# Patient Record
Sex: Male | Born: 1945 | Race: White | Hispanic: No | Marital: Married | Smoking: Never smoker
Health system: Southern US, Community
[De-identification: ages and names within clinical notes are randomized; demographics above are authoritative.]

## PROBLEM LIST (undated history)

## (undated) DIAGNOSIS — K649 Unspecified hemorrhoids: Secondary | ICD-10-CM

## (undated) DIAGNOSIS — I10 Essential (primary) hypertension: Secondary | ICD-10-CM

## (undated) DIAGNOSIS — Z950 Presence of cardiac pacemaker: Secondary | ICD-10-CM

## (undated) DIAGNOSIS — I4510 Unspecified right bundle-branch block: Secondary | ICD-10-CM

## (undated) DIAGNOSIS — I441 Atrioventricular block, second degree: Secondary | ICD-10-CM

## (undated) DIAGNOSIS — M1990 Unspecified osteoarthritis, unspecified site: Secondary | ICD-10-CM

## (undated) DIAGNOSIS — E7800 Pure hypercholesterolemia, unspecified: Secondary | ICD-10-CM

## (undated) DIAGNOSIS — I471 Supraventricular tachycardia, unspecified: Secondary | ICD-10-CM

## (undated) DIAGNOSIS — N400 Enlarged prostate without lower urinary tract symptoms: Secondary | ICD-10-CM

## (undated) HISTORY — DX: Unspecified hemorrhoids: K64.9

## (undated) HISTORY — DX: Essential (primary) hypertension: I10

## (undated) HISTORY — DX: Supraventricular tachycardia: I47.1

## (undated) HISTORY — DX: Supraventricular tachycardia, unspecified: I47.10

## (undated) HISTORY — PX: LAMINECTOMY: SHX219

## (undated) HISTORY — PX: PROSTATE BIOPSY: SHX241

## (undated) HISTORY — DX: Pure hypercholesterolemia, unspecified: E78.00

## (undated) HISTORY — DX: Atrioventricular block, second degree: I44.1

## (undated) HISTORY — DX: Benign prostatic hyperplasia without lower urinary tract symptoms: N40.0

## (undated) HISTORY — DX: Unspecified right bundle-branch block: I45.10

---

## 1997-09-02 HISTORY — PX: THYROID SURGERY: SHX805

## 2000-05-28 ENCOUNTER — Other Ambulatory Visit: Admission: RE | Admit: 2000-05-28 | Discharge: 2000-05-28 | Payer: Self-pay | Admitting: Urology

## 2012-11-30 ENCOUNTER — Encounter: Payer: Self-pay | Admitting: Cardiology

## 2012-12-07 ENCOUNTER — Encounter: Payer: Self-pay | Admitting: Cardiology

## 2012-12-08 ENCOUNTER — Encounter (INDEPENDENT_AMBULATORY_CARE_PROVIDER_SITE_OTHER): Payer: Self-pay | Admitting: *Deleted

## 2012-12-09 ENCOUNTER — Ambulatory Visit (INDEPENDENT_AMBULATORY_CARE_PROVIDER_SITE_OTHER): Payer: Self-pay | Admitting: Cardiology

## 2012-12-09 ENCOUNTER — Encounter: Payer: Self-pay | Admitting: Cardiology

## 2012-12-09 VITALS — BP 167/100 | HR 68 | Ht 76.0 in | Wt 279.0 lb

## 2012-12-09 DIAGNOSIS — R55 Syncope and collapse: Secondary | ICD-10-CM | POA: Insufficient documentation

## 2012-12-09 DIAGNOSIS — E785 Hyperlipidemia, unspecified: Secondary | ICD-10-CM

## 2012-12-09 DIAGNOSIS — I10 Essential (primary) hypertension: Secondary | ICD-10-CM

## 2012-12-09 MED ORDER — LISINOPRIL 5 MG PO TABS: 5.0000 mg | ORAL_TABLET | Freq: Every day | ORAL | Status: DC

## 2012-12-09 NOTE — Patient Instructions (Addendum)
Please start Lisinopril 5 mg a day Continue all other medications as listed.  Your physician has requested that you have an exercise tolerance test at [facility]. For further information please visit [email or web address] Please also follow instruction sheet, as given.  Your physician has recommended that you wear an event monitor. Event monitors are medical devices that record the heart's electrical activity. Doctors most often [place] these monitors to diagnose arrhythmias. Arrhythmias are problems with the speed or rhythm of the heartbeat. The monitor is a small, portable device. You can wear one while you do your normal daily activities. This is usually used to diagnose what is causing palpitations/syncope (passing out).  Follow up will be based on these results.

## 2012-12-09 NOTE — Progress Notes (Signed)
HPI The patient presents for evaluation of syncope. He has a history of right bundle branch block but no other cardiac issues. He has never required any cardiovascular testing. He has had high blood pressure and management for this. He exercises routinely at the [facility] walking on a treadmill. When he does this he does not report excessive chest discomfort, neck or arm discomfort. He has not had any excessive shortness of breath, PND or orthopnea. He has no palpitations, presyncope or syncope. However, 2 weeks ago he was nearing the end of a 45 minute walk. He knows his heart rate was pretty high but he wasn't being monitored. He started to get slightly dizzy and started to slow down treadmill. He then apparently had a frank syncopal episode and actually has an abrasion on his hand from this. He says he was only out for seconds. There was no loss of bowel or bladder. It was witnessed and without seizure activity. He knew where he was he came to. He has not had any symptoms like this since that time though he hasn't been on the treadmill. He does get some occasional mild orthostasis. Of note his blood pressure had been running slightly lower and he has not been taking his lisinopril for about one week. I have reviewed labs which demonstrated normal TSH and electrolytes.  No Known Allergies  Current Outpatient Prescriptions  Medication Sig Dispense Refill  . aspirin 81 MG tablet Take 81 mg by mouth daily.      . finasteride (PROSCAR) 5 MG tablet Take 5 mg by mouth daily.      . fish oil-omega-3 fatty acids 1000 MG capsule Take 2 g by mouth daily.      Marland Kitchen lisinopril (PRINIVIL,ZESTRIL) 10 MG tablet Take 10 mg by mouth daily.      . Multiple Vitamin (MULTIVITAMIN) tablet Take 1 tablet by mouth daily.      . simvastatin (ZOCOR) 20 MG tablet Take 20 mg by mouth every evening.       No current facility-administered medications for this visit.    Past Medical History  Diagnosis Date  . Benign prostatic  hyperplasia   . Hemorrhoid   . Hypercholesterolemia   . Hypertension     Past Surgical History  Procedure Laterality Date  . Thyroid surgery  1999    L lobectomy   . Prostate biopsy    . Colonoscopy    . Laminectomy      Lumbar    Family History  Problem Relation Age of Onset  . Arthritis Brother   . Cancer Paternal Grandfather     Pancreatic cancer   . Heart attack Maternal Grandfather 56  . Hypertension Father   . Heart attack Father 46  . Pelvic inflammatory disease Father   . Hypertension Mother   . Stroke Mother   . Deep vein thrombosis Mother     History   Social History  . Marital Status: Unknown    Spouse Name: N/A    Number of Children: 2  . Years of Education: N/A   Occupational History  . Retired     Runner, broadcasting/film/video   Social History Main Topics  . Smoking status: Never Smoker   . Smokeless tobacco: Not on file  . Alcohol Use: Yes  . Drug Use: Not on file  . Sexually Active: Not on file   Other Topics Concern  . Not on file   Social History Narrative   Lives at home with wife.  ROS:  Positive for arthritis and varicose veins. Otherwise as stated in the HPI and negative for all other systems.   PHYSICAL EXAM BP 169/100  Pulse 65  Ht 6\' 4"  (1.93 m)  Wt 279 lb (126.554 kg)  BMI 33.98 kg/m2 GENERAL:  Well appearing HEENT:  Pupils equal round and reactive, fundi not visualized, oral mucosa unremarkable NECK:  No jugular venous distention, waveform within normal limits, carotid upstroke brisk and symmetric, no bruits, no thyromegaly LYMPHATICS:  No cervical, inguinal adenopathy LUNGS:  Clear to auscultation bilaterally BACK:  No CVA tenderness CHEST:  Unremarkable HEART:  PMI not displaced or sustained,S1 and S2 within normal limits, no S3, no S4, no clicks, no rubs, no murmurs ABD:  Flat, positive bowel sounds normal in frequency in pitch, no bruits, no rebound, no guarding, no midline pulsatile mass, no hepatomegaly, no splenomegaly EXT:  2  plus pulses throughout, no edema, no cyanosis no clubbing SKIN:  No rashes no nodules NEURO:  Cranial nerves II through XII grossly intact, motor grossly intact throughout PSYCH:  Cognitively intact, oriented to person place and time   EKG:  Sinus rhythm, rate 78, right bundle branch block, no acute ST-T wave changes. 12/03/12.  ASSESSMENT AND PLAN  SYNCOPE:  This was likely a vagal episode related to his exercise and probably inadequate hydration. I'm going to bring him back to sinus treadmill test to make sure there were no inducible arrhythmias and less likely evidence of ischemia. This is normal I will proceed with a 21 day event monitor to make sure there are no obvious dysrhythmias particularly given his conduction disturbance. Further evaluation will be based on this.  HTN:  Since his diastolic is increasing he can restart 5 mg of lisinopril. We also discussed therapeutic lifestyle changes.  HYPERLIPIDEMIA:  Per Estanislado Pandy, MD  OBESITY:  He is working on his fitness which is laudable. I will prescribe a calorie counting approach weight loss.

## 2012-12-15 ENCOUNTER — Ambulatory Visit (HOSPITAL_COMMUNITY)
Admission: RE | Admit: 2012-12-15 | Discharge: 2012-12-15 | Disposition: A | Discharge status: Home / Self Care | Payer: Medicare Other | Source: Ambulatory Visit | Attending: Cardiology | Admitting: Cardiology

## 2012-12-15 ENCOUNTER — Other Ambulatory Visit: Payer: Self-pay | Admitting: *Deleted

## 2012-12-15 ENCOUNTER — Telehealth: Payer: Self-pay | Admitting: Cardiology

## 2012-12-15 ENCOUNTER — Encounter: Payer: Self-pay | Admitting: Cardiology

## 2012-12-15 DIAGNOSIS — R55 Syncope and collapse: Secondary | ICD-10-CM

## 2012-12-15 DIAGNOSIS — I10 Essential (primary) hypertension: Secondary | ICD-10-CM | POA: Insufficient documentation

## 2012-12-15 NOTE — Progress Notes (Signed)
Stress Lab Nurses Notes - [facility]   Emanual Lamountain  12/15/2012  Reason for doing test: Syncope  Type of test: Regular GTX  Nurse performing test: Parke Poisson, RN  Nuclear Medicine Tech: Not Applicable  Echo Tech: Not Applicable  MD performing test: Ival Bible & Joni Reining NP  Family MD: Dr. Neita Carp  Test explained and consent signed: yes  IV started: No IV started  Symptoms: SOB  Treatment/Intervention: None  Reason test stopped: reached target HR and SOB  After recovery IV was: NA  Patient to return to Nuc. Med at : NA  Patient discharged: Home  Patient's Condition upon discharge was: stable  Comments: During test peak 207/95 & HR 153. Recovery BP 139/82 & HR 84. Symptoms resolved in recovery.  Erskine Speed T  Attending note:  Patient exercised on standard Bruce protocol for 7 minutes and 44 seconds achieving a maximum workload of 10.4 METS. Peak heart rate exceeded 85% maximum age predicted heart rate response. There was a significant hypertensive response, no reported chest pain or dizziness with exercise however. Baseline tracing shows sinus rhythm with incomplete right bundle branch block. No consistent ST segment abnormalities were noted. Patient had bursts of PSVT around 160 beats per minute, not clearly symptomatic but fairly limited at 5-20 beats at a time. Also rare PVCs. Although no definite ischemic response, did have PSVT with exercise, and therefore further cardiac monitoring is being scheduled. Patient already has follow with Dr. Antoine Poche.  Jonelle Sidle, M.D., F.A.C.C.

## 2012-12-15 NOTE — Progress Notes (Signed)
Stress Lab Nurses Notes - [facility]  Elmin Wiederholt 12/15/2012 Reason for doing test: Syncope Type of test: Regular GTX Nurse performing test: Parke Poisson, RN Nuclear Medicine Tech: Not Applicable Echo Tech: Not Applicable MD performing test: Ival Bible  & Joni Reining NP Family MD: Dr.  Neita Carp Test explained and consent signed: yes IV started: No IV started Symptoms: SOB Treatment/Intervention: None Reason test stopped: reached target HR and SOB After recovery IV was: NA Patient to return to Nuc. Med at : NA Patient discharged: Home Patient's Condition upon discharge was: stable Comments: During test peak 207/95 & HR 153.  Recovery BP 139/82 & HR 84.  Symptoms resolved in recovery. Erskine Speed T

## 2012-12-15 NOTE — Telephone Encounter (Signed)
Patient has questions regarding Cardionet monitor that was placed this morning. / tgs

## 2012-12-16 NOTE — Telephone Encounter (Signed)
Resolved over the phone

## 2012-12-24 ENCOUNTER — Telehealth (INDEPENDENT_AMBULATORY_CARE_PROVIDER_SITE_OTHER): Payer: Self-pay | Admitting: *Deleted

## 2012-12-24 ENCOUNTER — Encounter (INDEPENDENT_AMBULATORY_CARE_PROVIDER_SITE_OTHER): Payer: Self-pay | Admitting: *Deleted

## 2012-12-24 ENCOUNTER — Other Ambulatory Visit (INDEPENDENT_AMBULATORY_CARE_PROVIDER_SITE_OTHER): Payer: Self-pay | Admitting: *Deleted

## 2012-12-24 ENCOUNTER — Institutional Professional Consult (permissible substitution): Payer: Self-pay | Admitting: Cardiovascular Disease

## 2012-12-24 ENCOUNTER — Encounter (INDEPENDENT_AMBULATORY_CARE_PROVIDER_SITE_OTHER): Payer: Self-pay

## 2012-12-24 DIAGNOSIS — Z1211 Encounter for screening for malignant neoplasm of colon: Secondary | ICD-10-CM

## 2012-12-24 DIAGNOSIS — Z86010 Personal history of colonic polyps: Secondary | ICD-10-CM

## 2012-12-24 NOTE — Telephone Encounter (Signed)
Patient needs movi prep 

## 2012-12-25 MED ORDER — PEG-KCL-NACL-NASULF-NA ASC-C 100 G PO SOLR: 1.0000 | Freq: Once | ORAL | Status: DC

## 2013-01-12 ENCOUNTER — Telehealth: Payer: Self-pay | Admitting: *Deleted

## 2013-01-12 NOTE — Telephone Encounter (Signed)
Called to [address] office to advise the cardionet end of service report has been scanned into the media section of pt chart for review per MD Hochrein per noted pt has f/u apt tomorrow in the [place] office and pt is set up to leave the country on 01[place]15[place]13 and pt requested this information get to MD Hochrein in time for his f/u, spoke to Dr Antoine Poche nurse Pam to advise this has been scanned in for review at his apt, she notes that she will look into pt chart and call our office if unable to locate, also advised this nurse personally routed information to Dr [facility], nurse understood, pt will be consulted at f/u tomorrow

## 2013-01-13 ENCOUNTER — Encounter: Payer: Self-pay | Admitting: Cardiology

## 2013-01-13 ENCOUNTER — Ambulatory Visit (INDEPENDENT_AMBULATORY_CARE_PROVIDER_SITE_OTHER): Payer: Medicare Other | Admitting: Cardiology

## 2013-01-13 ENCOUNTER — Ambulatory Visit: Payer: Self-pay | Admitting: Cardiology

## 2013-01-13 VITALS — BP 131/82 | HR 64 | Ht 76.0 in | Wt 278.0 lb

## 2013-01-13 DIAGNOSIS — R55 Syncope and collapse: Secondary | ICD-10-CM

## 2013-01-13 DIAGNOSIS — E785 Hyperlipidemia, unspecified: Secondary | ICD-10-CM

## 2013-01-13 DIAGNOSIS — I10 Essential (primary) hypertension: Secondary | ICD-10-CM

## 2013-01-13 NOTE — Progress Notes (Signed)
   HPI The patient presents for evaluation of syncope. He has a history of right bundle branch block but no other cardiac issues. At the last visit he had an exercise treadmill test which was unremarkable. He worked 21 day event monitor which demonstrated no significant dysrhythmias. He has had no further symptoms since then. In particular he has had no palpitations, presyncope or syncope. He is active doing yard work and denies any chest pressure, other anginal liver shortness of breath.  No Known Allergies  Current Outpatient Prescriptions  Medication Sig Dispense Refill  . aspirin 81 MG tablet Take 81 mg by mouth daily.      . finasteride (PROSCAR) 5 MG tablet Take 5 mg by mouth daily.      . fish oil-omega-3 fatty acids 1000 MG capsule Take 2 g by mouth daily.      Marland Kitchen lisinopril (PRINIVIL,ZESTRIL) 5 MG tablet Take 1 tablet (5 mg total) by mouth daily.      . Multiple Vitamin (MULTIVITAMIN) tablet Take 1 tablet by mouth daily.      . peg 3350 powder (MOVIPREP) 100 G SOLR Take 1 kit (100 g total) by mouth once.  1 kit  0  . simvastatin (ZOCOR) 20 MG tablet Take 20 mg by mouth every evening. Pt stated they cut in dosage in half for a 10 mg dosage per Dr. Neita Carp.       No current facility-administered medications for this visit.    Past Medical History  Diagnosis Date  . Benign prostatic hyperplasia   . Hemorrhoid   . Hypercholesterolemia   . Hypertension   . RBBB     Past Surgical History  Procedure Laterality Date  . Thyroid surgery  1999    L lobectomy   . Prostate biopsy    . Colonoscopy    . Laminectomy      Lumbar    ROS:  Positive for arthritis and varicose veins. Otherwise as stated in the HPI and negative for all other systems.   PHYSICAL EXAM BP 131/82  Pulse 64  Ht 6\' 4"  (1.93 m)  Wt 278 lb (126.1 kg)  BMI 33.85 kg/m2 GENERAL:  Well appearing NECK:  No jugular venous distention, waveform within normal limits, carotid upstroke brisk and symmetric, no bruits,  no thyromegaly LUNGS:  Clear to auscultation bilaterally HEART:  PMI not displaced or sustained,S1 and S2 within normal limits, no S3, no S4, no clicks, no rubs, no murmurs ABD:  Flat, positive bowel sounds normal in frequency in pitch, no bruits, no rebound, no guarding, no midline pulsatile mass, no hepatomegaly, no splenomegaly EXT:  2 plus pulses throughout, no edema, no cyanosis no clubbing     ASSESSMENT AND PLAN  SYNCOPE:  The etiology of this is not clear. Given the fact that he has had no further episodes no change in therapy or further cardiovascular testing is indicated.  HTN:  I did reduce his lisinopril at the last visit and his blood pressures have been well controlled. This may be titrated down as he loses weight.  OBESITY:  We discussed again calorie restriction and weight loss goals.

## 2013-01-13 NOTE — Patient Instructions (Addendum)
The current medical regimen is effective;  continue present plan and medications.  Follow up as needed 

## 2013-02-23 ENCOUNTER — Telehealth (INDEPENDENT_AMBULATORY_CARE_PROVIDER_SITE_OTHER): Payer: Self-pay | Admitting: *Deleted

## 2013-02-23 NOTE — Telephone Encounter (Signed)
agree

## 2013-02-23 NOTE — Telephone Encounter (Signed)
  Procedure: tcs  Reason/Indication:  Hx polyps  Has patient had this procedure before?  Yes, 03/2007 (scanned)  If so, when, by whom and where?    Is there a family history of colon cancer?  no  Who?  What age when diagnosed?    Is patient diabetic?   no      Does patient have prosthetic heart valve?  no  Do you have a pacemaker?  no  Has patient ever had endocarditis? no  Has patient had joint replacement within last 12 months?  no  Is patient on Coumadin, Plavix and/or Aspirin? yes  Medications: asa 81 mg daily, lisinopril 10 mg daily, simvastatin 10 mg 1/2 tab daily, finasteride 5 mg daily, fish oil 1000 mg daily, multi vit  Allergies: nkda  Medication Adjustment: asa 2 days  Procedure date & time: 03/24/13 at 830

## 2013-03-10 ENCOUNTER — Encounter (HOSPITAL_COMMUNITY): Payer: Self-pay | Admitting: Pharmacy Technician

## 2013-03-24 ENCOUNTER — Encounter (HOSPITAL_COMMUNITY)
Admission: RE | Disposition: A | Discharge status: Home / Self Care | Payer: Self-pay | Source: Ambulatory Visit | Attending: Internal Medicine

## 2013-03-24 ENCOUNTER — Encounter (HOSPITAL_COMMUNITY): Payer: Self-pay

## 2013-03-24 ENCOUNTER — Ambulatory Visit (HOSPITAL_COMMUNITY)
Admission: RE | Admit: 2013-03-24 | Discharge: 2013-03-24 | Disposition: A | Discharge status: Home / Self Care | Payer: Medicare Other | Source: Ambulatory Visit | Attending: Internal Medicine | Admitting: Internal Medicine

## 2013-03-24 DIAGNOSIS — D126 Benign neoplasm of colon, unspecified: Secondary | ICD-10-CM | POA: Insufficient documentation

## 2013-03-24 DIAGNOSIS — D128 Benign neoplasm of rectum: Secondary | ICD-10-CM

## 2013-03-24 DIAGNOSIS — Z860100 Personal history of colon polyps, unspecified: Secondary | ICD-10-CM | POA: Insufficient documentation

## 2013-03-24 DIAGNOSIS — K5730 Diverticulosis of large intestine without perforation or abscess without bleeding: Secondary | ICD-10-CM

## 2013-03-24 DIAGNOSIS — D129 Benign neoplasm of anus and anal canal: Secondary | ICD-10-CM

## 2013-03-24 DIAGNOSIS — I10 Essential (primary) hypertension: Secondary | ICD-10-CM | POA: Insufficient documentation

## 2013-03-24 HISTORY — DX: Unspecified osteoarthritis, unspecified site: M19.90

## 2013-03-24 HISTORY — PX: COLONOSCOPY: SHX5424

## 2013-03-24 SURGERY — COLONOSCOPY
Anesthesia: Moderate Sedation

## 2013-03-24 MED ORDER — MEPERIDINE HCL 50 MG/ML IJ SOLN
INTRAMUSCULAR | Status: DC | PRN
  Administered 2013-03-24 (×2): 25 mg via INTRAVENOUS

## 2013-03-24 MED ORDER — STERILE WATER FOR IRRIGATION IR SOLN
Status: DC | PRN
  Administered 2013-03-24: 08:00:00

## 2013-03-24 MED ORDER — MEPERIDINE HCL 50 MG/ML IJ SOLN
INTRAMUSCULAR | Status: AC
  Filled 2013-03-24: qty 1

## 2013-03-24 MED ORDER — SODIUM CHLORIDE 0.9 % IV SOLN
INTRAVENOUS | Status: DC
  Administered 2013-03-24: 08:00:00 via INTRAVENOUS

## 2013-03-24 MED ORDER — MIDAZOLAM HCL 5 MG/5ML IJ SOLN
INTRAMUSCULAR | Status: AC
  Filled 2013-03-24: qty 10

## 2013-03-24 MED ORDER — MIDAZOLAM HCL 5 MG/5ML IJ SOLN
INTRAMUSCULAR | Status: DC | PRN
  Administered 2013-03-24 (×3): 2 mg via INTRAVENOUS

## 2013-03-24 NOTE — Op Note (Signed)
COLONOSCOPY PROCEDURE REPORT  PATIENT:  Jonathan Lamb  MR#:  [id number] Birthdate:  1946/03/11, 67 y.o., male Endoscopist:  Dr. Malissa Hippo, MD Referred By:  Dr. Estanislado Pandy, MD Procedure Date: 03/24/2013  Procedure:   Colonoscopy  Indications: Patient is 5 old Caucasian male with history of colonic adenomas. His last exam was in April 2008 with removal of 6 small polyps and 5 were tubular adenomas.  Informed Consent:  The procedure and risks were reviewed with the patient and informed consent was obtained.  Medications:  Demerol 50 mg IV Versed 6 mg IV  Description of procedure:  After a digital rectal exam was performed, that colonoscope was advanced from the anus through the rectum and colon to the area of the cecum, ileocecal valve and appendiceal orifice. The cecum was deeply intubated. These structures were well-seen and photographed for the record. From the level of the cecum and ileocecal valve, the scope was slowly and cautiously withdrawn. The mucosal surfaces were carefully surveyed utilizing scope tip to flexion to facilitate fold flattening as needed. The scope was pulled down into the rectum where a thorough exam including retroflexion was performed.  Findings:   Prep satisfactory. Six small polyps ablated via cold biopsy and submitted together. Location as follows; one at cecum; two at transverse and two at sigmoid colon and one at rectum. Single small diverticulum at sigmoid colon. Unremarkable anorectal junction.   Therapeutic/Diagnostic Maneuvers Performed:  See above Ascending colon was also examined by retroflex in the scope.    Complications:  None.  Cecal Withdrawal Time:  14 minutes  Impression:  Examination performed to cecum. Six small polyps ablated via cold biopsy and submitted together. Location as described above. Single small diverticulum at sigmoid colon.  Recommendations:  Standard instructions given. I will contact patient with  biopsy results and further recommendations.  REHMAN,NAJEEB U  03/24/2013 8:59 AM  CC: Dr. Estanislado Pandy, MD & Dr. Bonnetta Barry ref. provider found

## 2013-03-24 NOTE — H&P (Signed)
Jonathan Lamb is an 67 y.o. male.   Chief Complaint: Patient is here for colonoscopy. HPI: Patient is a 67 year old Caucasian male with history of colonic adenomas who is in for surveillance colonoscopy. He denies rectal bleeding change in bowel habits abdominal pain. His last colonoscopy was in April 2008 with removal of 6 small polyps in 5 or tubular adenomas. Family history is negative for CRC.  Past Medical History  Diagnosis Date  . Benign prostatic hyperplasia   . Hemorrhoid   . Hypercholesterolemia   . Hypertension   . RBBB   . Arthritis     Past Surgical History  Procedure Laterality Date  . Thyroid surgery  1999    L lobectomy   . Prostate biopsy    . Colonoscopy    . Laminectomy      Lumbar    Family History  Problem Relation Age of Onset  . Arthritis Brother   . Cancer Paternal Grandfather     Pancreatic cancer   . Heart attack Maternal Grandfather 56  . Hypertension Father   . Heart attack Father 15  . Pelvic inflammatory disease Father   . Hypertension Mother   . Stroke Mother   . Deep vein thrombosis Mother    Social History:  reports that he has never smoked. He does not have any smokeless tobacco history on file. He reports that  drinks alcohol. He reports that he does not use illicit drugs.  Allergies: No Known Allergies  Medications Prior to Admission  Medication Sig Dispense Refill  . aspirin 81 MG tablet Take 81 mg by mouth daily.      . finasteride (PROSCAR) 5 MG tablet Take 5 mg by mouth daily.      . fish oil-omega-3 fatty acids 1000 MG capsule Take 2 g by mouth daily.      Marland Kitchen lisinopril (PRINIVIL,ZESTRIL) 5 MG tablet Take 1 tablet (5 mg total) by mouth daily.      . Multiple Vitamin (MULTIVITAMIN) tablet Take 1 tablet by mouth daily.      . peg 3350 powder (MOVIPREP) 100 G SOLR Take 1 kit (100 g total) by mouth once.  1 kit  0  . simvastatin (ZOCOR) 20 MG tablet Take 10 mg by mouth every evening. Pt stated they cut in dosage in half for a 10  mg dosage per Dr. Neita Carp.        No results found for this or any previous visit (from the past 48 hour(s)). No results found.  ROS  Blood pressure 146/92, pulse 60, temperature 98.2 F (36.8 C), temperature source Oral, resp. rate 14, height 6\' 4"  (1.93 m), weight 272 lb (123.378 kg), SpO2 98.00%. Physical Exam  Constitutional: He appears well-developed and well-nourished.  HENT:  Mouth/Throat: Oropharynx is clear and moist.  Eyes: Conjunctivae are normal. No scleral icterus.  Neck: No thyromegaly present.  Cardiovascular: Normal rate, regular rhythm and normal heart sounds.   No murmur heard. Respiratory: Effort normal and breath sounds normal.  GI: Soft. He exhibits no distension and no mass. There is no tenderness.  Musculoskeletal: He exhibits no edema.  Lymphadenopathy:    He has no cervical adenopathy.  Neurological: He is alert.  Skin: Skin is warm and dry.     Assessment/Plan History of colonic adenomas. Surveillance colonoscopy.  REHMAN,NAJEEB U 03/24/2013, 8:12 AM

## 2013-03-29 ENCOUNTER — Encounter (HOSPITAL_COMMUNITY): Payer: Self-pay | Admitting: Internal Medicine

## 2013-04-05 ENCOUNTER — Encounter (INDEPENDENT_AMBULATORY_CARE_PROVIDER_SITE_OTHER): Payer: Self-pay | Admitting: *Deleted

## 2013-04-07 ENCOUNTER — Other Ambulatory Visit: Payer: Self-pay

## 2013-07-08 ENCOUNTER — Other Ambulatory Visit: Payer: Self-pay

## 2014-02-24 ENCOUNTER — Ambulatory Visit (INDEPENDENT_AMBULATORY_CARE_PROVIDER_SITE_OTHER): Payer: Medicare Other | Admitting: Cardiology

## 2014-02-24 ENCOUNTER — Encounter: Payer: Self-pay | Admitting: Cardiology

## 2014-02-24 VITALS — BP 170/90 | HR 67 | Ht 76.0 in | Wt 287.0 lb

## 2014-02-24 DIAGNOSIS — I4510 Unspecified right bundle-branch block: Secondary | ICD-10-CM | POA: Insufficient documentation

## 2014-02-24 NOTE — Progress Notes (Signed)
HPI The patient presents for follow up of a RBBB. He has had an exercise treadmill test which was unremarkable. He wore a 21 day event monitor which demonstrated no significant dysrhythmias. Since last saw him he occasionally gets some dizziness which seems to only happen after he does walking.  Unfortunately he has continued to gain weight from eating too much. He does not really feel any palpitations and has had no further syncopal episodes. He's had no chest pressure, neck or arm discomfort. He does get some mild dyspnea with exertion but no PND or orthopnea. He does have some mild chronic lower extremity swelling.  No Known Allergies  Current Outpatient Prescriptions  Medication Sig Dispense Refill  . aspirin 81 MG tablet Take 81 mg by mouth daily.      . finasteride (PROSCAR) 5 MG tablet Take 5 mg by mouth daily.      . fish oil-omega-3 fatty acids 1000 MG capsule Take 2 g by mouth daily.      Marland Kitchen lisinopril (PRINIVIL,ZESTRIL) 5 MG tablet Take 1 tablet (5 mg total) by mouth daily.      . Melatonin 5 MG CAPS Take by mouth.      . Multiple Vitamin (MULTIVITAMIN) tablet Take 1 tablet by mouth daily.      . simvastatin (ZOCOR) 20 MG tablet Take 10 mg by mouth every evening. Pt stated they cut in dosage in half for a 10 mg dosage per Dr. Quintin Alto.      . tamsulosin (FLOMAX) 0.4 MG CAPS capsule        No current facility-administered medications for this visit.    Past Medical History  Diagnosis Date  . Benign prostatic hyperplasia   . Hemorrhoid   . Hypercholesterolemia   . Hypertension   . RBBB   . Arthritis     Past Surgical History  Procedure Laterality Date  . Thyroid surgery  1999    L lobectomy   . Prostate biopsy    . Colonoscopy    . Laminectomy      Lumbar  . Colonoscopy N/A 03/24/2013    Procedure: COLONOSCOPY;  Surgeon: Rogene Houston, MD;  Location: [facility];  Service: Endoscopy;  Laterality: N/A;  830    ROS:  As stated in the HPI and negative for all other  systems.   PHYSICAL EXAM BP 170/90  Pulse 67  Ht 6\' 4"  (1.93 m)  Wt 287 lb (130.182 kg)  BMI 34.95 kg/m2 GENERAL:  Well appearing NECK:  No jugular venous distention, waveform within normal limits, carotid upstroke brisk and symmetric, no bruits, no thyromegaly LUNGS:  Clear to auscultation bilaterally HEART:  PMI not displaced or sustained,S1 and S2 within normal limits, no S3, no S4, no clicks, no rubs, no murmurs ABD:  Flat, positive bowel sounds normal in frequency in pitch, no bruits, no rebound, no guarding, no midline pulsatile mass, no hepatomegaly, no splenomegaly EXT:  2 plus pulses throughout, mild bilateral lower extremity edema, no cyanosis no clubbing  EKG:  NSR, rate 67, RBBB, no acute ST T wave changes.  02/24/2014  ASSESSMENT AND PLAN  SYNCOPE:  He has had no further episodes.  His has had some mild dizziness and he will let me know if this worsens. At this point I do not see a cardiac etiology.  HTN:  His blood pressure is elevated today but typically has not been.he's going to keep a blood pressure diary. He's going to work on weight loss. If  his blood pressure continues to run up with weight loss we will retitrate his medications upward. I had previously refused his ACE inhibitor dose.  OBESITY:  We discussed again calorie restriction and weight loss goals.

## 2014-02-24 NOTE — Patient Instructions (Signed)
Your physician recommends that you schedule a follow-up appointment in: one year to see Dr. Percival Spanish

## 2015-02-27 ENCOUNTER — Other Ambulatory Visit: Payer: Self-pay

## 2015-10-16 ENCOUNTER — Encounter: Payer: Self-pay | Admitting: Cardiology

## 2015-10-16 ENCOUNTER — Ambulatory Visit (INDEPENDENT_AMBULATORY_CARE_PROVIDER_SITE_OTHER): Payer: Medicare Other | Admitting: Cardiology

## 2015-10-16 VITALS — BP 116/78 | HR 84 | Ht 76.0 in | Wt 286.0 lb

## 2015-10-16 DIAGNOSIS — I10 Essential (primary) hypertension: Secondary | ICD-10-CM | POA: Diagnosis not present

## 2015-10-16 DIAGNOSIS — R0602 Shortness of breath: Secondary | ICD-10-CM | POA: Diagnosis not present

## 2015-10-16 DIAGNOSIS — R55 Syncope and collapse: Secondary | ICD-10-CM

## 2015-10-16 DIAGNOSIS — E785 Hyperlipidemia, unspecified: Secondary | ICD-10-CM

## 2015-10-16 DIAGNOSIS — I471 Supraventricular tachycardia: Secondary | ICD-10-CM

## 2015-10-16 DIAGNOSIS — I4510 Unspecified right bundle-branch block: Secondary | ICD-10-CM

## 2015-10-16 NOTE — Patient Instructions (Signed)
Your physician recommends that you schedule a follow-up appointment in: to be determined    Your physician has requested that you have an echocardiogram. Echocardiography is a painless test that uses sound waves to create images of your heart. It provides your doctor with information about the size and shape of your heart and how well your heart's chambers and valves are working. This procedure takes approximately one hour. There are no restrictions for this procedure.   Your physician has recommended that you wear an event monitor. Event monitors are medical devices that record the heart's electrical activity. Doctors most often [place] these monitors to diagnose arrhythmias. Arrhythmias are problems with the speed or rhythm of the heartbeat. The monitor is a small, portable device. You can wear one while you do your normal daily activities. This is usually used to diagnose what is causing palpitations/syncope (passing out).    Your physician recommends that you continue on your current medications as directed. Please refer to the Current Medication list given to you today.    If you need a refill on your cardiac medications before your next appointment, please call your pharmacy.     Thank you for choosing [facility] !

## 2015-10-16 NOTE — Progress Notes (Signed)
Cardiology Office Note  Date: 10/16/2015   ID: Jonathan Lamb, DOB May 07, 1946, MRN [id number]  PCP: Jonathan Hilding, MD  Evaluating Cardiologist: Jonathan Lesches, MD   Chief Complaint  Patient presents with  . Cardiac follow-up    History of Present Illness: Jonathan Lamb is a 70 y.o. male former patient of Dr. Percival Lamb, now establishing follow-up with me. This is our first meeting in the office. I reviewed records and updated his chart. He is here today with his wife. He is a former Environmental consultant. He tries to stay relatively active, but does have a prior history of exercise-induced syncope back in 2014. No specific etiology was ever uncovered. He has had no subsequent episodes of syncope, but still feels dizzy at times, and also has dyspnea on exertion with increased levels of activity such as walking up a hill. He does not report any exertional chest tightness however. He states that he never has any episodes of dizziness or shortness of breath at rest.  He wore a cardiac monitor back in May 2014 which demonstrated sinus rhythm and probable sinus tachycardia (heart rate 130s and 140s) with no specific arrhythmias or pauses. GXT done around that time as well was negative for ischemia, but he did have brief bursts of PSVT in the 160s anywhere from 5-20 beats at a time. I went over the strips with him today.  We reviewed his medications which are outlined below. He is not on any medications for heart rate control. He does not indicate any recent medication changes. I reviewed his ECG today which shows sinus rhythm with old right bundle branch block.  He and his wife are preparing to go on a cruise of the [employer] in about a month.  Past Medical History  Diagnosis Date  . Benign prostatic hyperplasia   . Hemorrhoid   . Hypercholesterolemia   . Essential hypertension   . RBBB   . Arthritis     Past Surgical History  Procedure Laterality Date  . Thyroid surgery  1999    L  lobectomy   . Prostate biopsy    . Colonoscopy    . Laminectomy      Lumbar  . Colonoscopy N/A 03/24/2013    Procedure: COLONOSCOPY;  Surgeon: Jonathan Houston, MD;  [facility];  Service: Endoscopy;  Laterality: N/A;  830    Current Outpatient Prescriptions  Medication Sig Dispense Refill  . aspirin 81 MG tablet Take 81 mg by mouth daily.    . finasteride (PROSCAR) 5 MG tablet Take 5 mg by mouth daily.    . fish oil-omega-3 fatty acids 1000 MG capsule Take 2 g by mouth daily.    Marland Kitchen lisinopril (PRINIVIL,ZESTRIL) 10 MG tablet Take 10 mg by mouth daily.    . simvastatin (ZOCOR) 20 MG tablet Take 20 mg by mouth daily.    . Melatonin 5 MG CAPS Take by mouth.    . Multiple Vitamin (MULTIVITAMIN) tablet Take 1 tablet by mouth daily.    . tamsulosin (FLOMAX) 0.4 MG CAPS capsule Take 0.4 mg by mouth daily after supper.      No current facility-administered medications for this visit.   Allergies:  Review of patient's allergies indicates no known allergies.   Social History: The patient  reports that he has never smoked. He does not have any smokeless tobacco history on file. He reports that he drinks alcohol. He reports that he does not use illicit drugs.   ROS:  Please see the history of present illness. Otherwise, complete review of systems is positive for intermittent exertional shortness of breath as well as dizziness..  All other systems are reviewed and negative.   Physical Exam: VS:  BP 116/78 mmHg  Pulse 84  Ht 6\' 4"  (A999333 m)  Wt 286 lb (129.729 kg)  BMI 34.83 kg/m2  SpO2 94%, BMI Body mass index is 34.83 kg/(m^2).  Wt Readings from Last 3 Encounters:  10/16/15 286 lb (129.729 kg)  02/24/14 287 lb (130.182 kg)  03/24/13 272 lb (123.378 kg)    General: Tall overweight male, appears comfortable at rest. HEENT: Conjunctiva and lids normal, oropharynx clear. Neck: Supple, no elevated JVP or carotid bruits, no thyromegaly. Lungs: Clear to auscultation, nonlabored  breathing at rest. Cardiac: Regular rate and rhythm, no S3, 2/6 systolic murmur, no pericardial rub. Abdomen: Soft, nontender, bowel sounds present, no guarding or rebound. Extremities: No pitting edema, distal pulses 2+. Skin: Warm and dry. Musculoskeletal: No kyphosis. Neuropsychiatric: Alert and oriented x3, affect grossly appropriate.  ECG: I personally reviewed the prior tracing from 09/26/2013 which showed normal sinus rhythm with incomplete right bundle branch block.  Recent Labwork:  April 2015: BUN 13, creatinine 1.0, potassium 4.4, AST 31, ALT 25   Other Studies Reviewed Today:  GXT 12/15/2012: Attending note:  Patient exercised on standard Bruce protocol for 7 minutes and 44 seconds achieving a maximum workload of 10.4 METS. Peak heart rate exceeded 85% maximum age predicted heart rate response. There was a significant hypertensive response, no reported chest pain or dizziness with exercise however. Baseline tracing shows sinus rhythm with incomplete right bundle branch block. No consistent ST segment abnormalities were noted. Patient had bursts of PSVT around 160 beats per minute, not clearly symptomatic but fairly limited at 5-20 beats at a time. Also rare PVCs. Although no definite ischemic response, did have PSVT with exercise, and therefore further cardiac monitoring is being scheduled. Patient already has follow with Dr. Percival Lamb.  Assessment and Plan:  1. Episodic dyspnea on exertion and also dizziness. No clear etiology as yet uncovered, although suspicious that this might be related to PSVT based on his GXT from 2014. He has not however had any sudden onset episodes of dizziness or syncope at rest. He reports that his symptoms have not progressed in general. Today we discussed obtaining an echocardiogram to ensure no major cardiac structural abnormalities, and also a seven-day cardiac monitor. I asked him to go about his usual physical activities including exercise. If we do  see PSVT, low-dose beta blocker can be considered. If this is normal, we may need to pursue a follow-up ischemic evaluation.  2. Long-standing right bundle branch block. ECG is not suggestive of Brugada pattern.  3. Essential hypertension by history. He is on lisinopril and his blood pressure today is normal.  4. Hyperlipidemia, on Zocor. He reports compliance with his medications and continues to follow with Dr. Quintin Alto.  Current medicines were reviewed with the patient today.   Orders Placed This Encounter  Procedures  . Cardiac event monitor  . EKG 12-Lead  . Echocardiogram    Disposition: Call with test results and determine next step.   Signed, Satira Sark, MD, [facility] 10/16/2015 1:57 PM    [facility] at [facility] [address] [place], [place] [address] Phone: [phone]; Fax: [phone]

## 2015-10-19 ENCOUNTER — Ambulatory Visit (INDEPENDENT_AMBULATORY_CARE_PROVIDER_SITE_OTHER): Payer: Medicare Other

## 2015-10-19 ENCOUNTER — Other Ambulatory Visit: Payer: Self-pay

## 2015-10-19 DIAGNOSIS — I471 Supraventricular tachycardia: Secondary | ICD-10-CM | POA: Diagnosis not present

## 2015-10-19 DIAGNOSIS — R55 Syncope and collapse: Secondary | ICD-10-CM

## 2015-10-23 ENCOUNTER — Encounter: Payer: Self-pay | Admitting: Cardiology

## 2015-10-24 NOTE — Telephone Encounter (Signed)
The finding of grade 1 diastolic dysfunction (which is mild by definition) is a relatively common situation, particularly with a history of hypertension. I would not generally expect this to be the explanation for the degree of symptoms we discussed at the recent visit, and therefore would not place too much emphasis on this finding at this point. We need to continue to look for other possibilities.

## 2015-10-27 ENCOUNTER — Ambulatory Visit: Payer: Self-pay | Admitting: Cardiology

## 2015-10-30 ENCOUNTER — Telehealth: Payer: Self-pay

## 2015-10-30 ENCOUNTER — Telehealth: Payer: Self-pay | Admitting: Cardiology

## 2015-10-30 NOTE — Telephone Encounter (Signed)
Please refer to the full report with my comments. This was routed to [facility] inadvertently, but is under the result sections for you to review. I have recommended starting a beta blocker due to frequency of SVT. I also think it might be a good idea to get an EP consultation as well. If this can be accomplished prior to his trip that would be optimal so at least we have a plan in place.

## 2015-10-30 NOTE — Telephone Encounter (Signed)
[place] pt  

## 2015-10-30 NOTE — Telephone Encounter (Signed)
   Encounter Messages  Expand AllCollapse All     Non-Urgent Medical Question     From   Jonathan Lamb    To   Satira Sark, MD    Sent   10/23/2015 10:51 AM       The report on my recent echocardiogram included the statement, "Doppler parameters are consistent with abnormal left ventricular relaxation (grade 1 diastolic dysfunction)". (This certainly sounds impressive enough to cause some problems!)    I received a phone call this morning that said that my symptoms are not the result of the chambers or valves of the heart but I wondered if, while unrelated to my symptoms, this is something new to be concerned about.  Is it?    Will I meet with Dr. Domenic Polite again sometime after he gets the results of my cardiac monitor (the monitoring period ends this coming Thursday (Feb. 23)?   Thanks. Jonathan Lamb            Patient awaiting results of event monitor,i see them but they were not routed to us,somehos [place] was involved? Pt going on cruise to [place]/cruise in a few weeksand wants to have resolution before

## 2015-11-01 ENCOUNTER — Encounter: Payer: Self-pay | Admitting: Cardiology

## 2015-11-01 ENCOUNTER — Ambulatory Visit (INDEPENDENT_AMBULATORY_CARE_PROVIDER_SITE_OTHER): Payer: Medicare Other | Admitting: Cardiology

## 2015-11-01 VITALS — BP 110/64 | HR 79 | Ht 76.0 in | Wt 288.0 lb

## 2015-11-01 DIAGNOSIS — I10 Essential (primary) hypertension: Secondary | ICD-10-CM | POA: Diagnosis not present

## 2015-11-01 DIAGNOSIS — E785 Hyperlipidemia, unspecified: Secondary | ICD-10-CM | POA: Diagnosis not present

## 2015-11-01 DIAGNOSIS — I519 Heart disease, unspecified: Secondary | ICD-10-CM | POA: Diagnosis not present

## 2015-11-01 DIAGNOSIS — I471 Supraventricular tachycardia: Secondary | ICD-10-CM | POA: Diagnosis not present

## 2015-11-01 DIAGNOSIS — I77819 Aortic ectasia, unspecified site: Secondary | ICD-10-CM

## 2015-11-01 MED ORDER — METOPROLOL SUCCINATE ER 25 MG PO TB24: 25.0000 mg | ORAL_TABLET | Freq: Every day | ORAL | Status: DC

## 2015-11-01 NOTE — Patient Instructions (Addendum)
Medication Instructions:  DECREASE LISINOPRIL TO 5 MG DAILY START TOPROL XL 25 MG DAILY   Labwork: NONE  Testing/Procedures: NONE  Follow-Up: Your physician recommends that you schedule a follow-up appointment in: 4 WEEKS    Any Other Special Instructions Will Be Listed Below (If Applicable).   I HAVE REFERRED YOU TO DR. Thompson Grayer and his office will contact you soon .  If you need a refill on your cardiac medications before your next appointment, please call your pharmacy.

## 2015-11-01 NOTE — Progress Notes (Signed)
Cardiology Office Note  Date: 11/01/2015   ID: Jonathan Lamb, DOB 12-07-1945, MRN [id number]  PCP: Manon Hilding, MD  Primary Cardiologist: Rozann Lesches, MD   Chief Complaint  Patient presents with  . Follow-up testing    History of Present Illness: Jonathan Lamb is a 70 y.o. male that I saw recently for the first time in consultation, a former patient of Dr. Percival Spanish. I referred him for an echocardiogram as well as a cardiac monitor for further investigation of episodic dyspnea on exertion as well as dizziness, with previously documented episodes of SVT during GXT in 2014. He is here today with his wife to go over the results. He reports no overall decline in symptomatology since last encounter. Continues to deny any progressive exertional chest pain or syncope.  Echocardiogram is reported below, we discussed the results in detail today. We discussed the finding of mild diastolic function and its general implications. He did not have any major valvular abnormalities. The aortic root was mildly ectatic and of no major clinical significance at this point.  His cardiac monitor showed fairly frequent episodes of SVT as well as one brief 4 beat NSVT event just prior to the initiation of an SVT event. I reviewed the strips with Dr. Rayann Heman who felt that the SVT was most likely an atrial tachycardia. We discussed treatment options. I showed the strips to the patient and his wife.  Today we discussed a trial of Toprol-XL, concurrently reducing his lisinopril to avoid hypotension. We also talked about the possibility of considering a repeat stress test to ensure no evidence of developing ischemic heart disease. For the time being we will try medical therapy changes first and then see him back in clinic.  He and his wife plan to go on a [place] cruise later this month. They state that the excursions they plan to do will not be high intensity.  Past Medical History  Diagnosis Date  .  Benign prostatic hyperplasia   . Hemorrhoid   . Hypercholesterolemia   . Essential hypertension   . RBBB   . Arthritis     Past Surgical History  Procedure Laterality Date  . Thyroid surgery  1999    L lobectomy   . Prostate biopsy    . Colonoscopy    . Laminectomy      Lumbar  . Colonoscopy N/A 03/24/2013    Procedure: COLONOSCOPY;  Surgeon: Rogene Houston, MD;  [facility]: [facility];  Service: Endoscopy;  Laterality: N/A;  830    Current Outpatient Prescriptions  Medication Sig Dispense Refill  . aspirin 81 MG tablet Take 81 mg by mouth daily.    . finasteride (PROSCAR) 5 MG tablet Take 5 mg by mouth daily.    . fish oil-omega-3 fatty acids 1000 MG capsule Take 2 g by mouth daily.    Marland Kitchen lisinopril (PRINIVIL,ZESTRIL) 10 MG tablet Take 10 mg by mouth daily.    . Multiple Vitamin (MULTIVITAMIN) tablet Take 1 tablet by mouth daily.    . Multiple Vitamin (MULTIVITAMIN) tablet Take by mouth.    . simvastatin (ZOCOR) 20 MG tablet Take 20 mg by mouth daily.    . tamsulosin (FLOMAX) 0.4 MG CAPS capsule Take 0.4 mg by mouth daily after supper.     . metoprolol succinate (TOPROL XL) 25 MG 24 hr tablet Take 1 tablet (25 mg total) by mouth daily. 90 tablet 3   No current facility-administered medications for this visit.   Allergies:  Review of patient's allergies indicates no known allergies.   Social History: The patient  reports that he has never smoked. He does not have any smokeless tobacco history on file. He reports that he drinks alcohol. He reports that he does not use illicit drugs.   ROS:  Please see the history of present illness. Otherwise, complete review of systems is positive for intermittent exertional dyspnea and dizziness as outlined without syncope.  All other systems are reviewed and negative.   Physical Exam: VS:  BP 110/64 mmHg  Pulse 79  Ht 6\' 4"  (1.93 m)  Wt 288 lb (130.636 kg)  BMI 35.07 kg/m2  SpO2 96%, BMI Body mass index is 35.07 kg/(m^2).  Wt  Readings from Last 3 Encounters:  11/01/15 288 lb (130.636 kg)  10/16/15 286 lb (129.729 kg)  02/24/14 287 lb (130.182 kg)    General: Tall overweight male, appears comfortable at rest. HEENT: Conjunctiva and lids normal, oropharynx clear. Neck: Supple, no elevated JVP or carotid bruits, no thyromegaly. Lungs: Clear to auscultation, nonlabored breathing at rest. Cardiac: Regular rate and rhythm, no S3, 2/6 systolic murmur, no pericardial rub. Abdomen: Soft, nontender, bowel sounds present, no guarding or rebound. Extremities: No pitting edema, distal pulses 2+. Skin: Warm and dry. Musculoskeletal: No kyphosis. Neuropsychiatric: Alert and oriented x3, affect grossly appropriate.  ECG: I personally reviewed the tracing from 10/16/2015 which showed sinus rhythm with old right bundle branch block.  Recent Labwork:  April 2015: BUN 13, creatinine 1.0, potassium 4.4, AST 31, ALT 25   Other Studies Reviewed Today:  GXT 12/15/2012: Attending note:  Patient exercised on standard Bruce protocol for 7 minutes and 44 seconds achieving a maximum workload of 10.4 METS. Peak heart rate exceeded 85% maximum age predicted heart rate response. There was a significant hypertensive response, no reported chest pain or dizziness with exercise however. Baseline tracing shows sinus rhythm with incomplete right bundle branch block. No consistent ST segment abnormalities were noted. Patient had bursts of PSVT around 160 beats per minute, not clearly symptomatic but fairly limited at 5-20 beats at a time. Also rare PVCs. Although no definite ischemic response, did have PSVT with exercise, and therefore further cardiac monitoring is being scheduled. Patient already has follow with Dr. Percival Spanish.  Echocardiogram 10/19/2015: Study Conclusions  - Left ventricle: The cavity size was normal. Wall thickness was at the upper limits of normal. Systolic function was normal. The estimated ejection fraction was in the  range of 55% to 60%. Doppler parameters are consistent with abnormal left ventricular relaxation (grade 1 diastolic dysfunction). - Aortic valve: Mildly calcified annulus. Trileaflet; mildly calcified leaflets. - Aortic root: Mildly ectatic ascending aorta. - Mitral valve: Calcified annulus. There was trivial regurgitation. - Left atrium: The atrium was at the upper limits of normal in size. - Right atrium: The atrium was at the upper limits of normal in size. - Atrial septum: No defect or patent foramen ovale was identified. - Tricuspid valve: There was physiologic regurgitation. Peak RV-RA gradient (S): 9 mm Hg. - Pulmonary arteries: Systolic pressure could not be accurately estimated. - Pericardium, extracardiac: There was no pericardial effusion.  Impressions:  - Upper normal LV wall thickness with LVEF 55-60%. Grade 1 diastolic dysfunction with normal estimated LV filling pressure. Upper normal left atrial chamber size. MAC with trivial mitral regurgitation. Mildly ectatic ascending aorta. Upper normal right atrial chamber size.  Assessment and Plan:  1. 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 I reviewed the strips with Dr. Rayann Heman. After discussion today  plan is to initiate Toprol-XL 25 mg daily, concurrently change lisinopril to 5 mg daily to reduce the chances of symptomatic hypotension. Given documentation of this rhythm with activity, supposition is that his dyspnea on exertion is likely related. I plan to have him seen by Dr. Rayann Heman for EP consultation when he gets back from his cruise. We did touch on the possibility of considering a repeat stress test (GXT was negative for ischemia in April 2014), but will see how he does on medical therapy first.  2. Mild diastolic dysfunction with history of hypertension. Unlikely that this is sole cause of his dyspnea on exertion. We discussed this today. LVEF is otherwise normal without major valvular  abnormalities.  3. Mildly ectatic aortic root. Asymptomatic and unlikely to be of major clinical concern at this time.  4. Hyperlipidemia, on Zocor.  Current medicines were reviewed with the patient today.   Orders Placed This Encounter  Procedures  . Ambulatory referral to Cardiac Electrophysiology    Disposition: FU with me in 4 weeks.   Signed, Satira Sark, MD, [facility] 11/01/2015 1:44 PM    [facility] [address] [place], [place] [address] Phone: [phone]; Fax: [phone]

## 2015-11-20 ENCOUNTER — Institutional Professional Consult (permissible substitution): Payer: Medicare Other | Admitting: Internal Medicine

## 2015-12-13 ENCOUNTER — Ambulatory Visit: Payer: Medicare Other | Admitting: Cardiology

## 2015-12-25 ENCOUNTER — Encounter: Payer: Self-pay | Admitting: Internal Medicine

## 2015-12-25 ENCOUNTER — Ambulatory Visit (INDEPENDENT_AMBULATORY_CARE_PROVIDER_SITE_OTHER): Payer: Medicare Other | Admitting: Internal Medicine

## 2015-12-25 VITALS — BP 140/90 | HR 63 | Ht 76.0 in | Wt 290.0 lb

## 2015-12-25 DIAGNOSIS — I471 Supraventricular tachycardia: Secondary | ICD-10-CM

## 2015-12-25 NOTE — Patient Instructions (Signed)
Medication Instructions:  Your physician recommends that you continue on your current medications as directed. Please refer to the Current Medication list given to you today.   Labwork: None ordered   Testing/Procedures: None ordered   Follow-Up: Your physician recommends that you schedule a follow-up appointment with Dr Domenic Polite as scheduled   Any Other Special Instructions Will Be Listed Below (If Applicable).     If you need a refill on your cardiac medications before your next appointment, please call your pharmacy.

## 2015-12-25 NOTE — Progress Notes (Signed)
Electrophysiology Office Note   Date:  12/25/2015   ID:  Jonathan Lamb, DOB August 20, 1946, MRN [id number]  PCP:  Manon Hilding, MD  Cardiologist:  Dr Domenic Polite Primary Electrophysiologist: Thompson Grayer, MD    Chief Complaint  Patient presents with  . Follow-up  . Chest Pain    within the last 30 days  . Shortness of Breath    within the last 30 days     History of Present Illness: Jonathan Lamb is a 70 y.o. male who presents today for electrophysiology evaluation.   The patient reports initially being diagnosed with SVT in 2014.  His symptoms were shortness of breath after eating with associated heart racing and dizziness.  He passed out on a treadmill at that time.  Episodes increased in frequency and duration.  He found that by exercising after eating that tachycardia would occur.  He recently wore a monitor which documented a long RP tachycardia.  He finds that with the SOB he has to bend over and rest before being able to exercise further.  He was placed on metoprolol 25mg  daily in March prior to a cruise.  He has done well since that time.  He is pleased that he has had no further episodes.  He went to [facility] yesterday and was very active without episodes.  Today, he denies symptoms of palpitations, chest pain, shortness of breath, orthopnea, PND, lower extremity edema, claudication, dizziness, presyncope, syncope, bleeding, or neurologic sequela. The patient is tolerating medications without difficulties and is otherwise without complaint today.    Past Medical History  Diagnosis Date  . Benign prostatic hyperplasia   . Hemorrhoid   . Hypercholesterolemia   . Essential hypertension   . RBBB   . Arthritis   . SVT (supraventricular tachycardia) ([place])     likely atrial tachycardia   Past Surgical History  Procedure Laterality Date  . Thyroid surgery  1999    L lobectomy, complicated by esophageal laceration requiring esophageal repair   . Prostate biopsy    .  Colonoscopy    . Laminectomy      Lumbar  . Colonoscopy N/A 03/24/2013    Procedure: COLONOSCOPY;  Surgeon: Rogene Houston, MD;  Location: [facility];  Service: Endoscopy;  Laterality: N/A;  830     Current Outpatient Prescriptions  Medication Sig Dispense Refill  . aspirin 81 MG tablet Take 81 mg by mouth daily.    . finasteride (PROSCAR) 5 MG tablet Take 5 mg by mouth daily.    . fish oil-omega-3 fatty acids 1000 MG capsule Take 2 g by mouth daily.    Marland Kitchen lisinopril (PRINIVIL,ZESTRIL) 20 MG tablet Take 1 tablet by mouth as directed.  0  . metoprolol succinate (TOPROL XL) 25 MG 24 hr tablet Take 1 tablet (25 mg total) by mouth daily. 90 tablet 3  . Multiple Vitamin (MULTIVITAMIN) tablet Take 1 tablet by mouth daily.    . Multiple Vitamin (MULTIVITAMIN) tablet Take by mouth.    . simvastatin (ZOCOR) 20 MG tablet Take 20 mg by mouth daily.    . tamsulosin (FLOMAX) 0.4 MG CAPS capsule Take 0.4 mg by mouth daily after supper.      No current facility-administered medications for this visit.    Allergies:   Review of patient's allergies indicates no known allergies.   Social History:  The patient  reports that he has never smoked. He does not have any smokeless tobacco history on file. He reports that  he drinks alcohol. He reports that he does not use illicit drugs.   Family History:  The patient's  family history includes Arthritis in his brother; Deep vein thrombosis in his mother; Heart attack (age of onset: 78) in his maternal grandfather; Heart attack (age of onset: 85) in his father; Hypertension in his father and mother; Pancreatic cancer in his paternal grandfather; Pelvic inflammatory disease in his father; Stroke in his mother.    ROS:  Please see the history of present illness.   All other systems are reviewed and negative.    PHYSICAL EXAM: VS:  BP 140/90 mmHg  Pulse 63  Ht 6\' 4"  (1.93 m)  Wt 290 lb (131.543 kg)  BMI 35.31 kg/m2  SpO2 97% , BMI Body mass index is 35.31  kg/(m^2). GEN: Well nourished, well developed, in no acute distress HEENT: normal Neck: no JVD, carotid bruits, or masses Cardiac: RRR; no murmurs, rubs, or gallops,no edema  Respiratory:  clear to auscultation bilaterally, normal work of breathing GI: soft, nontender, nondistended, + BS MS: no deformity or atrophy Skin: warm and dry  Neuro:  Strength and sensation are intact Psych: euthymic mood, full affect  EKG:  EKG is ordered today. The ekg ordered today shows sinus rhythm 63 bpm, PR 166 msec, QRS 126 msec, RBBB, otherwise normal ekg  Wt Readings from Last 3 Encounters:  12/25/15 290 lb (131.543 kg)  11/01/15 288 lb (130.636 kg)  10/16/15 286 lb (129.729 kg)      Other studies Reviewed: Additional studies/ records that were reviewed today include: Dr Chuck Hint notes, monitor reviewed and discussed with Dr Domenic Polite, echo, ETT from 2014 Review of the above records today demonstrates: long RP SVT   ASSESSMENT AND PLAN:  1.  SVT The patient has symptomatic long RP SVT.  Review of strips is most suggestive of ectopic atrial tachycardia.  Intersestingly the tachycardia occurs with vagal (eating) stimulation followed by adrenergic (exercise) activity.  He has started on low dose metoprolol with resolution of his symptoms.  He is pleased with current state. Therapeutic strategies for supraventricular tachycardia including medicine and ablation were discussed in detail with the patient today. We agree that given his resposne to metoprolol thus far, that we will continue this strategy.  If episdoes return, would advise increased metoprolol followed possibly by flecainide.  Would reserve ablation for medicine refractoriness given that this is an intermittent SVT which may or may not be inducible in the EP lab.  2. Obesity Body mass index is 35.31 kg/(m^2). Weight loss and lifestyle modification advised  Follow-up with Dr Domenic Polite as scheduled I will see when needed  Current  medicines are reviewed at length with the patient today.   The patient does not have concerns regarding his medicines.  The following changes were made today:  none  Signed, Thompson Grayer, MD  12/25/2015 9:12 AM     [facility] [address] [place] [address] [phone] (office) [phone] (fax)

## 2015-12-29 ENCOUNTER — Ambulatory Visit (INDEPENDENT_AMBULATORY_CARE_PROVIDER_SITE_OTHER): Payer: Medicare Other | Admitting: Cardiology

## 2015-12-29 ENCOUNTER — Encounter: Payer: Self-pay | Admitting: Cardiology

## 2015-12-29 VITALS — BP 104/58 | HR 86 | Ht 76.0 in | Wt 288.0 lb

## 2015-12-29 DIAGNOSIS — I10 Essential (primary) hypertension: Secondary | ICD-10-CM | POA: Diagnosis not present

## 2015-12-29 DIAGNOSIS — I471 Supraventricular tachycardia: Secondary | ICD-10-CM | POA: Diagnosis not present

## 2015-12-29 NOTE — Progress Notes (Signed)
.    Cardiology Office Note  Date: 12/29/2015   ID: Jonathan Lamb, DOB 1945-12-15, MRN [id number]  PCP: Manon Hilding, MD  Primary Cardiologist: Rozann Lesches, MD   Chief Complaint  Patient presents with  . Atrial tachycardia    History of Present Illness: Jonathan Lamb is a 70 y.o. male last seen in March. We initiated Toprol-XL for management of atrial tachycardia and I referred her to see Dr. Rayann Heman as well. He has done well so far on medical therapy and plan is to continue observation for now, reserving ablation or other medication adjustments for progressive symptoms.  He is here today with his wife for a follow-up visit. States that he is doing very well area he has had no further symptoms of shortness of breath or palpitations. Very satisfied with the current regimen.  Past Medical History  Diagnosis Date  . Benign prostatic hyperplasia   . Hemorrhoid   . Hypercholesterolemia   . Essential hypertension   . RBBB   . Arthritis   . SVT (supraventricular tachycardia) ([facility])     likely atrial tachycardia    Current Outpatient Prescriptions  Medication Sig Dispense Refill  . aspirin 81 MG tablet Take 81 mg by mouth daily.    . finasteride (PROSCAR) 5 MG tablet Take 5 mg by mouth daily.    . fish oil-omega-3 fatty acids 1000 MG capsule Take 2 g by mouth daily.    Marland Kitchen lisinopril (PRINIVIL,ZESTRIL) 20 MG tablet Take 1 tablet by mouth as directed.  0  . metoprolol succinate (TOPROL XL) 25 MG 24 hr tablet Take 1 tablet (25 mg total) by mouth daily. 90 tablet 3  . Multiple Vitamin (MULTIVITAMIN) tablet Take 1 tablet by mouth daily.    . Multiple Vitamin (MULTIVITAMIN) tablet Take by mouth.    . simvastatin (ZOCOR) 20 MG tablet Take 20 mg by mouth daily.    . tamsulosin (FLOMAX) 0.4 MG CAPS capsule Take 0.4 mg by mouth daily after supper.      No current facility-administered medications for this visit.   Allergies:  Review of patient's allergies indicates no known  allergies.   Social History: The patient  reports that he has never smoked. He does not have any smokeless tobacco history on file. He reports that he drinks alcohol. He reports that he does not use illicit drugs.   ROS:  Please see the history of present illness. Otherwise, complete review of systems is positive for none.  All other systems are reviewed and negative.   Physical Exam: VS:  BP 104/58 mmHg  Pulse 86  Ht 6\' 4"  (1.93 m)  Wt 288 lb (130.636 kg)  BMI 35.07 kg/m2  SpO2 96%, BMI Body mass index is 35.07 kg/(m^2).  Wt Readings from Last 3 Encounters:  12/29/15 288 lb (130.636 kg)  12/25/15 290 lb (131.543 kg)  11/01/15 288 lb (130.636 kg)    General: Tall overweight male, appears comfortable at rest. HEENT: Conjunctiva and lids normal, oropharynx clear. Neck: Supple, no elevated JVP or carotid bruits, no thyromegaly. Lungs: Clear to auscultation, nonlabored breathing at rest. Cardiac: Regular rate and rhythm, no S3, 2/6 systolic murmur, no pericardial rub. Abdomen: Soft, nontender, bowel sounds present, no guarding or rebound. Extremities: No pitting edema, distal pulses 2+.  ECG: I personally reviewed the recent tracing from 12/25/2015 which showed sinus rhythm with right bundle branch block.  Recent Labwork:  April 2015: BUN 13, creatinine 1.0, potassium 4.4, AST 31, ALT 25  Other Studies Reviewed Today:  [facility] 12/15/2012: Attending note:  Patient exercised on standard Bruce protocol for 7 minutes and 44 seconds achieving a maximum workload of 10.4 METS. Peak heart rate exceeded 85% maximum age predicted heart rate response. There was a significant hypertensive response, no reported chest pain or dizziness with exercise however. Baseline tracing shows sinus rhythm with incomplete right bundle branch block. No consistent ST segment abnormalities were noted. Patient had bursts of PSVT around 160 beats per minute, not clearly symptomatic but fairly limited at 5-20 beats at a  time. Also rare PVCs. Although no definite ischemic response, did have PSVT with exercise, and therefore further cardiac monitoring is being scheduled. Patient already has follow with Dr. Percival Spanish.  Echocardiogram 10/19/2015: Study Conclusions  - Left ventricle: The cavity size was normal. Wall thickness was at the upper limits of normal. Systolic function was normal. The estimated ejection fraction was in the range of 55% to 60%. Doppler parameters are consistent with abnormal left ventricular relaxation (grade 1 diastolic dysfunction). - Aortic valve: Mildly calcified annulus. Trileaflet; mildly calcified leaflets. - Aortic root: Mildly ectatic ascending aorta. - Mitral valve: Calcified annulus. There was trivial regurgitation. - Left atrium: The atrium was at the upper limits of normal in size. - Right atrium: The atrium was at the upper limits of normal in size. - Atrial septum: No defect or patent foramen ovale was identified. - Tricuspid valve: There was physiologic regurgitation. Peak RV-RA gradient (S): 9 mm Hg. - Pulmonary arteries: Systolic pressure could not be accurately estimated. - Pericardium, extracardiac: There was no pericardial effusion.  Impressions:  - Upper normal LV wall thickness with LVEF 55-60%. Grade 1 diastolic dysfunction with normal estimated LV filling pressure. Upper normal left atrial chamber size. MAC with trivial mitral regurgitation. Mildly ectatic ascending aorta. Upper normal right atrial chamber size.  Assessment and Plan:  1. Paroxysmal atrial tachycardia, symptomatically well controlled at this time on Toprol-XL. He has already seen Dr. Rayann Heman to discuss other treatment options if symptoms escalate. No changes were made today.  2. Mild diastolic dysfunction by recent echocardiogram. Blood pressure well controlled. Not on diuretic at this time.  Current medicines were reviewed with the patient  today.  Disposition: FU with me in 6 months.   Signed, Satira Sark, MD, [facility] 12/29/2015 1:04 PM    [place] at [facility] [address] [place], [place] [address] Phone: [phone]; Fax: [phone]

## 2015-12-29 NOTE — Patient Instructions (Signed)
Your physician wants you to follow-up in: 6 months You will receive a reminder letter in the mail two months in advance. If you don't receive a letter, please call our office to schedule the follow-up appointment.    Your physician recommends that you continue on your current medications as directed. Please refer to the Current Medication list given to you today.    If you need a refill on your cardiac medications before your next appointment, please call your pharmacy.     Thank you for choosing [facility] !        

## 2016-01-30 ENCOUNTER — Ambulatory Visit (INDEPENDENT_AMBULATORY_CARE_PROVIDER_SITE_OTHER): Payer: Medicare Other | Admitting: Podiatry

## 2016-01-30 ENCOUNTER — Encounter: Payer: Self-pay | Admitting: Podiatry

## 2016-01-30 VITALS — BP 142/96 | HR 60 | Resp 12

## 2016-01-30 DIAGNOSIS — Q828 Other specified congenital malformations of skin: Secondary | ICD-10-CM | POA: Diagnosis not present

## 2016-01-30 NOTE — Patient Instructions (Signed)
Today the skin growths on the bottom the right and left feet are most consistent with corn-like growths and usually are chronic in nature. If you would like you can self treat these with over-the-counter salicylic acid 123456 until the discomfort is resolved. Also, if you return as needed for office treatment which would include debridement and application of acid

## 2016-01-30 NOTE — Progress Notes (Signed)
   Subjective:    Patient ID: Jonathan Lamb, male    DOB: 19-Jul-1946, 70 y.o.   MRN: [id number]  HPI    This patient presents today complaining of approximately 2 year history of skin lesions on the plantar aspect of the right and left feet that are uncomfortable walking wearing shoes becoming more uncomfortable last several months. Patient has applied over-the-counter 123456 salicylic acid for approximately one month which did temporarily do some of discomfort in these areas.  Review of Systems  Genitourinary: Positive for frequency.  Musculoskeletal: Positive for gait problem.  Skin: Positive for color change.  Neurological: Positive for light-headedness.  All other systems reviewed and are negative.      Objective:   Physical Exam  Orientated 3  Vascular: No calf edema or calf tenderness bilaterally Mild bilateral peripheral edema right slightly greater than left DP and PT pulses 2/4 bilaterally Capillary reflex immediate bilaterally  Dermatological: Nucleated circumscribed plantar keratoses third MPJ plantar base fifth MPJ plantar lateral right heel area and plantar left hallux interphalangeal joint. After debridement there is no pinpoint bleeding, however, these lesions are circumscribed and have a hard dense keratin  Dry scaling skin bilaterally The toenails elongated brittle, deformed 6-10  Neurological: Sensation to 10 g monofilament wire intact 5/5 bilaterally Vibratory sensation reactive bilaterally Ankle reflex equal and reactive bilaterally  Musculoskeletal: There is no pain or crepitus on range of motion of ankle, subtalar, midtarsal joints bilaterally      Assessment & Plan:   Assessment: Satisfactory neurovascular status Hyperkeratotic lesions 4 (porokeratosis)  Plan: Today I had a discussion with patient informed that these plantar lesions were most consistent with cordlike growth rather than warts. I made aware they tend to be chronic. Today I  debrided all these areas implied salinocaine. We also discussed the possibility of self treatment including continue on with 123456 salicylic acid as needed  Return visit at patient's request

## 2016-06-28 ENCOUNTER — Ambulatory Visit: Payer: Medicare Other | Admitting: Cardiology

## 2016-07-01 ENCOUNTER — Encounter: Payer: Self-pay | Admitting: Cardiology

## 2016-07-01 NOTE — Progress Notes (Signed)
Cardiology Office Note  Date: 07/02/2016   ID: Jonathan Lamb, DOB 19-Jul-1946, MRN [id number]  PCP: Manon Hilding, MD  Primary Cardiologist: Rozann Lesches, MD   Chief Complaint  Patient presents with  . Atrial tachycardia    History of Present Illness: Jonathan Lamb is a 70 y.o. male last seen in April. He is here with his wife for a follow-up visit. Overall remains satisfied with control of palpitations. He still feels these when he exerts himself at times such as walking up a hill. Not consistently however. He has had no syncope. No exertional chest pain.  Current medications reviewed, he remains on Toprol-XL at 25 mg daily. Resting heart rate is around 60.  Past Medical History:  Diagnosis Date  . Arthritis   . Benign prostatic hyperplasia   . Essential hypertension   . Hemorrhoid   . Hypercholesterolemia   . RBBB   . SVT (supraventricular tachycardia) ([facility])    Atrial tachycardia    Current Outpatient Prescriptions  Medication Sig Dispense Refill  . aspirin 81 MG tablet Take 81 mg by mouth daily.    . finasteride (PROSCAR) 5 MG tablet Take 5 mg by mouth daily.    . fish oil-omega-3 fatty acids 1000 MG capsule Take 2 g by mouth daily.    Marland Kitchen lisinopril (PRINIVIL,ZESTRIL) 20 MG tablet Take 1 tablet by mouth as directed.  0  . metoprolol succinate (TOPROL XL) 25 MG 24 hr tablet Take 1 tablet (25 mg total) by mouth daily. 90 tablet 3  . Multiple Vitamin (MULTIVITAMIN) tablet Take 1 tablet by mouth daily.    . Multiple Vitamin (MULTIVITAMIN) tablet Take by mouth.    . simvastatin (ZOCOR) 20 MG tablet Take 20 mg by mouth daily.    . tamsulosin (FLOMAX) 0.4 MG CAPS capsule Take 0.4 mg by mouth daily after supper.      No current facility-administered medications for this visit.    Allergies:  Review of patient's allergies indicates no known allergies.   Social History: The patient  reports that he has never smoked. He has never used smokeless tobacco. He reports that  he drinks alcohol. He reports that he does not use drugs.   ROS:  Please see the history of present illness. Otherwise, complete review of systems is positive for none.  All other systems are reviewed and negative.   Physical Exam: VS:  BP 140/78   Pulse (!) 59   Ht 6\' 4"  (1.93 m)   Wt 271 lb (122.9 kg)   SpO2 96%   BMI 32.99 kg/m , BMI Body mass index is 32.99 kg/m.  Wt Readings from Last 3 Encounters:  07/02/16 271 lb (122.9 kg)  12/29/15 288 lb (130.6 kg)  12/25/15 290 lb (131.5 kg)    General: Tall overweight male, appears comfortable at rest. HEENT: Conjunctiva and lids normal, oropharynx clear. Neck: Supple, no elevated JVP or carotid bruits, no thyromegaly. Lungs: Clear to auscultation, nonlabored breathing at rest. Cardiac: Regular rate and rhythm, no S3, 2/6 systolic murmur, no pericardial rub. Abdomen: Soft, nontender, bowel sounds present, no guarding or rebound. Extremities: No pitting edema, distal pulses 2+.  ECG: I personally reviewed the tracing from 12/25/2015 which showed sinus rhythm with right bundle branch block.  Other Studies Reviewed Today:  Echocardiogram 10/19/2015: Study Conclusions  - Left ventricle: The cavity size was normal. Wall thickness was at the upper limits of normal. Systolic function was normal. The estimated ejection fraction was in the range  of 55% to 60%. Doppler parameters are consistent with abnormal left ventricular relaxation (grade 1 diastolic dysfunction). - Aortic valve: Mildly calcified annulus. Trileaflet; mildly calcified leaflets. - Aortic root: Mildly ectatic ascending aorta. - Mitral valve: Calcified annulus. There was trivial regurgitation. - Left atrium: The atrium was at the upper limits of normal in size. - Right atrium: The atrium was at the upper limits of normal in size. - Atrial septum: No defect or patent foramen ovale was identified. - Tricuspid valve: There was physiologic regurgitation. Peak  RV-RA gradient (S): 9 mm Hg. - Pulmonary arteries: Systolic pressure could not be accurately estimated. - Pericardium, extracardiac: There was no pericardial effusion.  Impressions:  - Upper normal LV wall thickness with LVEF 55-60%. Grade 1 diastolic dysfunction with normal estimated LV filling pressure. Upper normal left atrial chamber size. MAC with trivial mitral regurgitation. Mildly ectatic ascending aorta. Upper normal right atrial chamber size.  Assessment and Plan:  1. Paroxysmal atrial tachycardia, patient reports adequate symptom control on Toprol-XL at current dose and resting heart rate is around 60. No changes made today. We will continue with observation.  2. Essential hypertension, no changes made to current regimen.  Current medicines were reviewed with the patient today.  Disposition: Follow-up in 6 months.  Signed, Satira Sark, MD, [facility] 07/02/2016 8:55 AM    [employer] at [place]. [address] [place], [place] [address] Phone: [phone]; Fax: [phone]

## 2016-07-02 ENCOUNTER — Ambulatory Visit (INDEPENDENT_AMBULATORY_CARE_PROVIDER_SITE_OTHER): Payer: Medicare Other | Admitting: Cardiology

## 2016-07-02 ENCOUNTER — Encounter: Payer: Self-pay | Admitting: Cardiology

## 2016-07-02 VITALS — BP 140/78 | HR 59 | Ht 76.0 in | Wt 271.0 lb

## 2016-07-02 DIAGNOSIS — I471 Supraventricular tachycardia: Secondary | ICD-10-CM | POA: Diagnosis not present

## 2016-07-02 DIAGNOSIS — I10 Essential (primary) hypertension: Secondary | ICD-10-CM | POA: Diagnosis not present

## 2016-07-02 NOTE — Patient Instructions (Signed)
Your physician wants you to follow-up in: 6 months Dr McDowell You will receive a reminder letter in the mail two months in advance. If you don't receive a letter, please call our office to schedule the follow-up appointment.  Your physician recommends that you continue on your current medications as directed. Please refer to the Current Medication list given to you today.    If you need a refill on your cardiac medications before your next appointment, please call your pharmacy.       Thank you for choosing [facility] !         

## 2016-09-04 ENCOUNTER — Encounter: Payer: Self-pay | Admitting: Podiatry

## 2016-09-04 ENCOUNTER — Ambulatory Visit (INDEPENDENT_AMBULATORY_CARE_PROVIDER_SITE_OTHER): Payer: Medicare Other | Admitting: Podiatry

## 2016-09-04 DIAGNOSIS — Q828 Other specified congenital malformations of skin: Secondary | ICD-10-CM

## 2016-09-04 DIAGNOSIS — L600 Ingrowing nail: Secondary | ICD-10-CM

## 2016-09-04 NOTE — Patient Instructions (Signed)

## 2016-09-05 NOTE — Progress Notes (Signed)
Subjective:     Patient ID: Jonathan Lamb, male   DOB: 07/13/1946, 71 y.o.   MRN: [id number]  HPI patient presents stating I need this big toenail removed on my right foot and I have lesions on both feet the become painful   Review of Systems     Objective:   Physical Exam Neurovascular status is found to be intact muscle strength was adequate with patient noted to have thick and painful incurvated hallux nail right and keratotic lesions plantar aspect of both feet that are painful when pressed    Assessment:     Ingrown deformed hallux nail right with pain and lesion formation bilateral    Plan:     H&P conditions reviewed and recommended correction of ingrown toenail. Patient wants procedure and today I infiltrated the right hallux 60 Milligan Xylocaine Marcaine mixture and after reviewing risk I removed the nail exposed matrix and applied phenol 5 applications 30 seconds followed by alcohol lavage and sterile dressing I then debrided lesions on the plantar aspect of both feet and instructed on reduced activity for several days

## 2016-10-23 ENCOUNTER — Other Ambulatory Visit: Payer: Self-pay | Admitting: Cardiology

## 2017-01-30 NOTE — Progress Notes (Signed)
Cardiology Office Note  Date: 01/31/2017   ID: Jonathan Lamb, DOB [birth year], MRN [id number]  PCP: Manon Hilding, MD  Primary Cardiologist: Rozann Lesches, MD   Chief Complaint  Patient presents with  . Paroxysmal atrial tachycardia    History of Present Illness: Jonathan Lamb is a 71 y.o. male last seen in October 2017.He is here with his wife for a follow-up visit. They both just got back from a trip to [place]. He has been doing well, has brief occasional episodes of palpitations, but nothing prolonged and he has been very satisfied overall.  He has continued on Toprol-XL for treatment of paroxysmal atrial tachycardia.  I personally reviewed his ECG today which shows sinus rhythm with right bundle branch block.  Past Medical History:  Diagnosis Date  . Arthritis   . Benign prostatic hyperplasia   . Essential hypertension   . Hemorrhoid   . Hypercholesterolemia   . RBBB   . SVT (supraventricular tachycardia) (HCC)    Atrial tachycardia    Current Outpatient Prescriptions  Medication Sig Dispense Refill  . aspirin 81 MG tablet Take 81 mg by mouth daily.    . finasteride (PROSCAR) 5 MG tablet Take 5 mg by mouth daily.    . fish oil-omega-3 fatty acids 1000 MG capsule Take 2 g by mouth daily.    Marland Kitchen lisinopril (PRINIVIL,ZESTRIL) 20 MG tablet Take 1 tablet by mouth as directed.  0  . metoprolol succinate (TOPROL-XL) 25 MG 24 hr tablet take 1 tablet by mouth once daily 90 tablet 3  . Multiple Vitamin (MULTIVITAMIN) tablet Take 1 tablet by mouth daily.    . Multiple Vitamin (MULTIVITAMIN) tablet Take by mouth.    . simvastatin (ZOCOR) 20 MG tablet Take 20 mg by mouth daily.    . tamsulosin (FLOMAX) 0.4 MG CAPS capsule Take 0.4 mg by mouth daily after supper.      No current facility-administered medications for this visit.    Allergies:  Patient has no known allergies.   Social History: The patient  reports that he has never smoked. He has never used smokeless  tobacco. He reports that he drinks alcohol. He reports that he does not use drugs.   ROS:  Please see the history of present illness. Otherwise, complete review of systems is positive for sciatica pain.  All other systems are reviewed and negative.   Physical Exam: VS:  BP (!) 142/89   Pulse 69   Ht 6' 4"  (1.93 m)   Wt 280 lb (127 kg)   BMI 34.08 kg/m , BMI Body mass index is 34.08 kg/m.  Wt Readings from Last 3 Encounters:  01/31/17 280 lb (127 kg)  07/02/16 271 lb (122.9 kg)  12/29/15 288 lb (130.6 kg)    General: Tall overweight male, appears comfortable at rest. HEENT: Conjunctiva and lids normal, oropharynx clear. Neck: Supple, no elevated JVP or carotid bruits, no thyromegaly. Lungs: Clear to auscultation, nonlabored breathing at rest. Cardiac: Regular rate and rhythm, no S3, 2/6 systolic murmur, no pericardial rub. Abdomen: Soft, nontender, bowel sounds present, no guarding or rebound. Extremities: No pitting edema, distal pulses 2+.  ECG: I personally reviewed the tracing from 12/25/2015 which showed sinus rhythm with right bundle branch block.  Other Studies Reviewed Today:  Echocardiogram 10/19/2015: Study Conclusions  - Left ventricle: The cavity size was normal. Wall thickness was at the upper limits of normal. Systolic function was normal. The estimated ejection fraction was in the range of  55% to 60%. Doppler parameters are consistent with abnormal left ventricular relaxation (grade 1 diastolic dysfunction). - Aortic valve: Mildly calcified annulus. Trileaflet; mildly calcified leaflets. - Aortic root: Mildly ectatic ascending aorta. - Mitral valve: Calcified annulus. There was trivial regurgitation. - Left atrium: The atrium was at the upper limits of normal in size. - Right atrium: The atrium was at the upper limits of normal in size. - Atrial septum: No defect or patent foramen ovale was identified. - Tricuspid valve: There was physiologic  regurgitation. Peak RV-RA gradient (S): 9 mm Hg. - Pulmonary arteries: Systolic pressure could not be accurately estimated. - Pericardium, extracardiac: There was no pericardial effusion.  Impressions:  - Upper normal LV wall thickness with LVEF 55-60%. Grade 1 diastolic dysfunction with normal estimated LV filling pressure. Upper normal left atrial chamber size. MAC with trivial mitral regurgitation. Mildly ectatic ascending aorta. Upper normal right atrial chamber size.  Assessment and Plan:  1. Paroxysmal atrial tachycardia, symptomatically stable on Toprol-XL. ECG reviewed and stable as well. Continue with current plan.  2. Essential hypertension, systolic blood pressure in the 140s. He continues on lisinopril as well and follows with Dr. Quintin Alto.  Current medicines were reviewed with the patient today.   Orders Placed This Encounter  Procedures  . EKG 12-Lead    Disposition: Follow-up in 6 months.  Signed, Satira Sark, MD, [facility] 01/31/2017 8:52 AM     [facility] [address], [place], [place] [address] Phone: [phone]; Fax: [phone]

## 2017-01-31 ENCOUNTER — Encounter: Payer: Self-pay | Admitting: Cardiology

## 2017-01-31 ENCOUNTER — Ambulatory Visit (INDEPENDENT_AMBULATORY_CARE_PROVIDER_SITE_OTHER): Payer: Medicare Other | Admitting: Cardiology

## 2017-01-31 VITALS — BP 142/89 | HR 69 | Ht 76.0 in | Wt 280.0 lb

## 2017-01-31 DIAGNOSIS — I10 Essential (primary) hypertension: Secondary | ICD-10-CM | POA: Diagnosis not present

## 2017-01-31 DIAGNOSIS — I471 Supraventricular tachycardia: Secondary | ICD-10-CM | POA: Diagnosis not present

## 2017-01-31 NOTE — Patient Instructions (Addendum)
Your physician wants you to follow-up in: 6 months Dr.McDowell You will receive a reminder letter in the mail two months in advance. If you don't receive a letter, please call our office to schedule the follow-up appointment.    Your physician recommends that you continue on your current medications as directed. Please refer to the Current Medication list given to you today.   If you need a refill on your cardiac medications before your next appointment, please call your pharmacy.    No tests or labs ordered today      Thank you for choosing  [facility] !        

## 2017-03-17 ENCOUNTER — Telehealth: Payer: Self-pay | Admitting: Cardiology

## 2017-03-17 ENCOUNTER — Encounter (HOSPITAL_COMMUNITY): Payer: Self-pay | Admitting: Emergency Medicine

## 2017-03-17 ENCOUNTER — Emergency Department (HOSPITAL_COMMUNITY): Payer: Medicare Other

## 2017-03-17 ENCOUNTER — Observation Stay (HOSPITAL_COMMUNITY)
Admission: EM | Admit: 2017-03-17 | Discharge: 2017-03-18 | Disposition: A | Discharge status: Home / Self Care | Payer: Medicare Other | Attending: Internal Medicine | Admitting: Internal Medicine

## 2017-03-17 DIAGNOSIS — Z823 Family history of stroke: Secondary | ICD-10-CM | POA: Insufficient documentation

## 2017-03-17 DIAGNOSIS — Z800 Family history of malignant neoplasm of digestive organs: Secondary | ICD-10-CM | POA: Insufficient documentation

## 2017-03-17 DIAGNOSIS — I471 Supraventricular tachycardia: Secondary | ICD-10-CM | POA: Diagnosis not present

## 2017-03-17 DIAGNOSIS — I4510 Unspecified right bundle-branch block: Secondary | ICD-10-CM | POA: Insufficient documentation

## 2017-03-17 DIAGNOSIS — R079 Chest pain, unspecified: Secondary | ICD-10-CM

## 2017-03-17 DIAGNOSIS — Z79899 Other long term (current) drug therapy: Secondary | ICD-10-CM | POA: Diagnosis not present

## 2017-03-17 DIAGNOSIS — I10 Essential (primary) hypertension: Secondary | ICD-10-CM | POA: Insufficient documentation

## 2017-03-17 DIAGNOSIS — E785 Hyperlipidemia, unspecified: Secondary | ICD-10-CM | POA: Diagnosis not present

## 2017-03-17 DIAGNOSIS — N400 Enlarged prostate without lower urinary tract symptoms: Secondary | ICD-10-CM | POA: Insufficient documentation

## 2017-03-17 DIAGNOSIS — Z808 Family history of malignant neoplasm of other organs or systems: Secondary | ICD-10-CM | POA: Insufficient documentation

## 2017-03-17 DIAGNOSIS — R0609 Other forms of dyspnea: Principal | ICD-10-CM | POA: Insufficient documentation

## 2017-03-17 DIAGNOSIS — Z7982 Long term (current) use of aspirin: Secondary | ICD-10-CM | POA: Insufficient documentation

## 2017-03-17 DIAGNOSIS — E782 Mixed hyperlipidemia: Secondary | ICD-10-CM | POA: Diagnosis not present

## 2017-03-17 DIAGNOSIS — Z8261 Family history of arthritis: Secondary | ICD-10-CM | POA: Diagnosis not present

## 2017-03-17 DIAGNOSIS — Z6833 Body mass index (BMI) 33.0-33.9, adult: Secondary | ICD-10-CM | POA: Insufficient documentation

## 2017-03-17 DIAGNOSIS — R42 Dizziness and giddiness: Secondary | ICD-10-CM | POA: Insufficient documentation

## 2017-03-17 LAB — CBC
HCT: 41.8 % (ref 39.0–52.0)
HEMOGLOBIN: 13.8 g/dL (ref 13.0–17.0)
MCH: 29.4 pg (ref 26.0–34.0)
MCHC: 33 g/dL (ref 30.0–36.0)
MCV: 89.1 fL (ref 78.0–100.0)
PLATELETS: 213 10*3/uL (ref 150–400)
RBC: 4.69 MIL/uL (ref 4.22–5.81)
RDW: 13.5 % (ref 11.5–15.5)
WBC: 6.4 10*3/uL (ref 4.0–10.5)

## 2017-03-17 LAB — URINALYSIS, ROUTINE W REFLEX MICROSCOPIC
Bilirubin Urine: NEGATIVE
Glucose, UA: NEGATIVE mg/dL
HGB URINE DIPSTICK: NEGATIVE
Ketones, ur: NEGATIVE mg/dL
LEUKOCYTES UA: NEGATIVE
Nitrite: NEGATIVE
PROTEIN: NEGATIVE mg/dL
Specific Gravity, Urine: 1.006 (ref 1.005–1.030)
pH: 7 (ref 5.0–8.0)

## 2017-03-17 LAB — LIPID PANEL
CHOL/HDL RATIO: 3.5 ratio
CHOLESTEROL: 147 mg/dL (ref 0–200)
HDL: 42 mg/dL (ref >40)
LDL Cholesterol: 76 mg/dL (ref 0–99)
Triglycerides: 146 mg/dL (ref <150)
VLDL: 29 mg/dL (ref 0–40)

## 2017-03-17 LAB — BASIC METABOLIC PANEL
ANION GAP: 8 (ref 5–15)
BUN: 14 mg/dL (ref 6–20)
CALCIUM: 9.2 mg/dL (ref 8.9–10.3)
CHLORIDE: 107 mmol/L (ref 101–111)
CO2: 23 mmol/L (ref 22–32)
CREATININE: 1.04 mg/dL (ref 0.61–1.24)
GFR calc non Af Amer: >60 mL/min (ref >60)
Glucose, Bld: 99 mg/dL (ref 65–99)
Potassium: 4.1 mmol/L (ref 3.5–5.1)
SODIUM: 138 mmol/L (ref 135–145)

## 2017-03-17 LAB — TROPONIN I
Troponin I: <0.03 ng/mL (ref <0.03)
Troponin I: <0.03 ng/mL (ref <0.03)

## 2017-03-17 MED ORDER — TAMSULOSIN HCL 0.4 MG PO CAPS
0.4000 mg | ORAL_CAPSULE | Freq: Every day | ORAL | Status: DC
  Administered 2017-03-17: 0.4 mg via ORAL
  Filled 2017-03-17: qty 1

## 2017-03-17 MED ORDER — LISINOPRIL 10 MG PO TABS
10.0000 mg | ORAL_TABLET | Freq: Every day | ORAL | Status: DC
  Administered 2017-03-17 – 2017-03-18 (×2): 10 mg via ORAL
  Filled 2017-03-17 (×2): qty 1

## 2017-03-17 MED ORDER — ONDANSETRON HCL 4 MG/2ML IJ SOLN: 4.0000 mg | Freq: Four times a day (QID) | INTRAMUSCULAR | Status: DC | PRN

## 2017-03-17 MED ORDER — SIMVASTATIN 20 MG PO TABS
20.0000 mg | ORAL_TABLET | Freq: Every day | ORAL | Status: DC
  Administered 2017-03-17 – 2017-03-18 (×2): 20 mg via ORAL
  Filled 2017-03-17 (×2): qty 1

## 2017-03-17 MED ORDER — ENOXAPARIN SODIUM 40 MG/0.4ML ~~LOC~~ SOLN
40.0000 mg | SUBCUTANEOUS | Status: DC
  Administered 2017-03-17: 40 mg via SUBCUTANEOUS
  Filled 2017-03-17 (×2): qty 0.4

## 2017-03-17 MED ORDER — ACETAMINOPHEN 325 MG PO TABS: 650.0000 mg | ORAL_TABLET | ORAL | Status: DC | PRN

## 2017-03-17 MED ORDER — ASPIRIN EC 325 MG PO TBEC
325.0000 mg | DELAYED_RELEASE_TABLET | Freq: Every day | ORAL | Status: DC
  Administered 2017-03-18: 325 mg via ORAL
  Filled 2017-03-17: qty 1

## 2017-03-17 MED ORDER — FINASTERIDE 5 MG PO TABS
5.0000 mg | ORAL_TABLET | Freq: Every day | ORAL | Status: DC
  Administered 2017-03-17 – 2017-03-18 (×2): 5 mg via ORAL
  Filled 2017-03-17 (×2): qty 1

## 2017-03-17 MED ORDER — MORPHINE SULFATE (PF) 4 MG/ML IV SOLN: 2.0000 mg | INTRAVENOUS | Status: DC | PRN

## 2017-03-17 MED ORDER — GI COCKTAIL ~~LOC~~: 30.0000 mL | Freq: Four times a day (QID) | ORAL | Status: DC | PRN

## 2017-03-17 MED ORDER — METOPROLOL SUCCINATE ER 25 MG PO TB24
25.0000 mg | ORAL_TABLET | Freq: Every day | ORAL | Status: DC
  Administered 2017-03-18: 25 mg via ORAL
  Filled 2017-03-17: qty 1

## 2017-03-17 NOTE — Progress Notes (Signed)
NM stress test scheduled for tomorrow, pt is aware of being NPO from midnight, wife was here earlier and was updated about his condition, BP rechecked and it is coming down right now, will continue to monitor

## 2017-03-17 NOTE — ED Provider Notes (Signed)
[facility] DEPT Provider Note   CSN: [id number] Arrival date & time: 03/17/17  1144     History   Chief Complaint Chief Complaint  Patient presents with  . Dizziness  . Chest Pain    HPI Jonathan LEVEQUE is a 71 y.o. male.  HPI  71 year old male who presents with chest tightness and dizziness. He has a history of atrial tachycardia, hyperlipidemia, and hypertension. Follows with Dr. Domenic Polite from cardiology. Reports that over the past 2-3 weeks with activity has been feeling very lightheaded as he could pass out. Things such as walking long distances and going up the steps that made him symptomatic. Reports that symptoms do not, and if he is changing positions from sitting to standing or lying to sitting. He has not had any associating syncope. Has noticed over the past week that with activity such as walking and going up the steps he has had chest tightness. Shortness of breath. No diaphoresis, nausea or vomiting. Has felt very fatigued. No recent illness. No dehydration. Symptoms resolve with rest.  Past Medical History:  Diagnosis Date  . Arthritis   . Benign prostatic hyperplasia   . Essential hypertension   . Hemorrhoid   . Hypercholesterolemia   . RBBB   . SVT (supraventricular tachycardia) (HCC)    Atrial tachycardia    Patient Active Problem List   Diagnosis Date Noted  . Chest pain 03/17/2017  . SVT (supraventricular tachycardia) ([facility]) 12/25/2015  . Morbid obesity ([place]) 12/25/2015  . RBBB 02/24/2014  . Syncope 12/09/2012  . HTN (hypertension) 12/09/2012  . Hyperlipidemia 12/09/2012    Past Surgical History:  Procedure Laterality Date  . COLONOSCOPY    . COLONOSCOPY N/A 03/24/2013   Procedure: COLONOSCOPY;  Surgeon: Rogene Houston, MD;  Location: [facility];  Service: Endoscopy;  Laterality: N/A;  830  . LAMINECTOMY     Lumbar  . PROSTATE BIOPSY    . THYROID SURGERY  1999   L lobectomy, complicated by esophageal laceration requiring esophageal  repair        Home Medications    Prior to Admission medications   Medication Sig Start Date End Date Taking? Authorizing Provider  aspirin 81 MG tablet Take 81 mg by mouth daily.   Yes [provider]  finasteride (PROSCAR) 5 MG tablet Take 5 mg by mouth daily.   Yes [provider]  fish oil-omega-3 fatty acids 1000 MG capsule Take 2 g by mouth daily.   Yes [provider]  ibuprofen (ADVIL,MOTRIN) 200 MG tablet Take 400 mg by mouth every 6 (six) hours as needed for headache.   Yes [provider]  lisinopril (PRINIVIL,ZESTRIL) 10 MG tablet Take 10 mg by mouth daily.  10/26/15  Yes [provider]  metoprolol succinate (TOPROL-XL) 25 MG 24 hr tablet take 1 tablet by mouth once daily 10/23/16  Yes Satira Sark, MD  Multiple Vitamin (MULTIVITAMIN) tablet Take 1 tablet by mouth daily.   Yes [provider]  multivitamin-lutein (OCUVITE-LUTEIN) CAPS capsule Take 1 capsule by mouth daily.   Yes [provider]  simvastatin (ZOCOR) 20 MG tablet Take 20 mg by mouth daily.   Yes [provider]  tamsulosin (FLOMAX) 0.4 MG CAPS capsule Take 0.4 mg by mouth daily after supper.  01/20/14  Yes [provider]    Family History Family History  Problem Relation Age of Onset  . Hypertension Father   . Heart attack Father 57  . Pelvic inflammatory disease Father   .  Hypertension Mother   . Stroke Mother   . Deep vein thrombosis Mother   . Arthritis Brother   . Pancreatic cancer Paternal Grandfather   . Heart attack Maternal Grandfather 56    Social History Social History  Substance Use Topics  . Smoking status: Never Smoker  . Smokeless tobacco: Never Used  . Alcohol use 0.0 oz/week     Comment: 1 beer monthly     Allergies   Patient has no known allergies.   Review of Systems Review of Systems  Cardiovascular: Positive for chest pain.  Gastrointestinal: Negative for abdominal pain.    Neurological: Positive for light-headedness.  All other systems reviewed and are negative.    Physical Exam Updated Vital Signs BP 135/87   Pulse 61   Temp 98.4 F (36.9 C) (Oral)   Resp 15   Ht 6\' 4"  (1.93 m)   Wt 125.6 kg (277 lb)   SpO2 96%   BMI 33.72 kg/m   Physical Exam Physical Exam  Nursing note and vitals reviewed. Constitutional: Well developed, well nourished, non-toxic, and in no acute distress Head: Normocephalic and atraumatic.  Mouth/Throat: Oropharynx is clear and moist.  Neck: Normal range of motion. Neck supple.  Cardiovascular: Normal rate and regular rhythm.   Pulmonary/Chest: Effort normal and breath sounds normal.  Abdominal: Soft. There is no tenderness. There is no rebound and no guarding.  Musculoskeletal: Normal range of motion.  Neurological: Alert, no facial droop, fluent speech, moves all extremities symmetrically Skin: Skin is warm and dry.  Psychiatric: Cooperative   ED Treatments / Results  Labs (all labs ordered are listed, but only abnormal results are displayed) Labs Reviewed  URINALYSIS, ROUTINE W REFLEX MICROSCOPIC - Abnormal; Notable for the following:       Result Value   Color, Urine STRAW (*)    All other components within normal limits  BASIC METABOLIC PANEL  CBC  TROPONIN I  TROPONIN I  TROPONIN I  TROPONIN I  LIPID PANEL  CBG MONITORING, ED    EKG  EKG Interpretation  Date/Time:  Monday March 17 2017 12:23:09 EDT Ventricular Rate:  66 PR Interval:  172 QRS Duration: 136 QT Interval:  454 QTC Calculation: 475 R Axis:   63 Text Interpretation:  Normal sinus rhythm Right bundle branch block Abnormal ECG no previous EKG  Confirmed by Brantley Stage [id number]) on 03/17/2017 12:56:51 PM       Radiology Dg Chest 2 View  Result Date: 03/17/2017 CLINICAL DATA:  Intermittent chest tightness with activity. EXAM: CHEST  2 VIEW COMPARISON:  01/24/2010. FINDINGS: Normal sized heart. Tortuous aorta. Mild diffuse peribronchial  thickening. Thoracic spine degenerative changes, including changes of DISH. IMPRESSION: Mild chronic bronchitic changes.  No acute abnormality. Electronically Signed   By: Claudie Revering M.D.   On: 03/17/2017 13:50    Procedures Procedures (including critical care time)  Medications Ordered in ED Medications  metoprolol succinate (TOPROL-XL) 24 hr tablet 25 mg (not administered)  lisinopril (PRINIVIL,ZESTRIL) tablet 10 mg (not administered)  simvastatin (ZOCOR) tablet 20 mg (not administered)  tamsulosin (FLOMAX) capsule 0.4 mg (not administered)  finasteride (PROSCAR) tablet 5 mg (not administered)  acetaminophen (TYLENOL) tablet 650 mg (not administered)  ondansetron (ZOFRAN) injection 4 mg (not administered)  enoxaparin (LOVENOX) injection 40 mg (not administered)  morphine 4 MG/ML injection 2 mg (not administered)  gi cocktail (Maalox,Lidocaine,Donnatal) (not administered)  aspirin EC tablet 325 mg (not administered)     Initial Impression / Assessment and Plan /  ED Course  I have reviewed the triage vital signs and the nursing notes.  Pertinent labs & imaging results that were available during my care of the patient were reviewed by me and considered in my medical decision making (see chart for details).     71 year old male with history of hypertension and hyperlipidemia who presents with dizziness and chest tightness with activity. Currently asymptomatic in the ED. He is well-appearing, with stable vital signs. EKG shows stable right bundle branch block but no other acute ischemic changes. Initial troponin is normal. Chest x-ray shows no acute cardiopulmonary processes. Blood work overall reassuring. Symptoms at this time is not concerning for PE or dissection. He does have heart score of 5, with somewhat concerning history. Plan to admit for observation for cardiac rule out. Discussed with Dyanne Carrel.    Final Clinical Impressions(s) / ED Diagnoses   Final diagnoses:    Nonspecific chest pain    New Prescriptions New Prescriptions   No medications on file     Forde Dandy, MD 03/17/17 1537

## 2017-03-17 NOTE — Telephone Encounter (Signed)
[name] called stating that Jonathan Lamb has been having dizzy,sweats for a week now.  States that he is having Some shortness of breath with some chest pains. Dr. Domenic Polite is doctor.

## 2017-03-17 NOTE — ED Notes (Signed)
Hospitalist at pt's bedside 

## 2017-03-17 NOTE — ED Triage Notes (Signed)
Pt. Stated, I ve been having dizziness with bouts of chest pain when doing something.

## 2017-03-17 NOTE — Progress Notes (Signed)
New pt admission from ED. Pt brought to the floor in stable condition. Vitals taken. Initial Assessment done. All immediate pertinent needs to patient addressed. Patient Guide given to patient. Important safety instructions relating to hospitalization reviewed with patient. Patient verbalized understanding. Will continue to monitor pt. 

## 2017-03-17 NOTE — Telephone Encounter (Signed)
Pt wife says pt has been having symptoms of dizziness/chest pain/sweats/SOB a few times daily for the last week and requesting to be seen by Dr Domenic Polite - advised that pt needs to be evaluated at [facility] ED - pt wife agreeable to this and will take pt to ED. FYI to Dr Domenic Polite

## 2017-03-17 NOTE — ED Notes (Signed)
Pt transported to xray 

## 2017-03-17 NOTE — H&P (Signed)
History and Physical    Jonathan Lamb GHW:[id number] DOB: 1945-10-19 DOA: 03/17/2017  PCP: Manon Hilding, MD Patient coming from: home  Chief Complaint: DOE/chest pain  HPI: MONTREAL STEIDLE is a delightful 71 y.o. male with medical history significant for atrial tachycardia, hyperlipidemia, hypertension, right bundle branch block BPH since to the emergency Department chief complaint of dyspnea with exertion. Initial evaluation concerning for ACS. Patient being admitted for chest pain rule out.  Information is obtained from the patient. He reports for the last 2-3 weeks he's had intermittent dyspnea with exertion. Associated symptoms include lightheadedness to the point he feels as if "I could passout" and chest "tightness".. He reports walking long distances and going up a few steps make him short of breath and dizzy. In addition some position changes particularly sitting to standing he experiences dizziness. He describes the tightness in his chest located anteriorly nonradiating. He states the chest discomfort is much less problematic to him compared to the dyspnea. He denies any worsening lower extremity edema. He denies any headache syncope or near-syncope. He denies fever chills nausea vomiting diarrhea. He denies dysuria hematuria frequency or urgency. He denies constipation diarrhea melena or bright red blood per rectum.    ED Course: In the emergency department pressure is mildly elevated no cardia or tachypnea he is not hypoxic. He is orthostatic from sitting to standing  Review of Systems: As per HPI otherwise all other systems reviewed and are negative.   Ambulatory Status: He relates independently. Is independent with ADLs. Lives at home with his wife in [place]  Past Medical History:  Diagnosis Date  . Arthritis   . Benign prostatic hyperplasia   . Essential hypertension   . Hemorrhoid   . Hypercholesterolemia   . RBBB   . SVT (supraventricular tachycardia)  (HCC)    Atrial tachycardia    Past Surgical History:  Procedure Laterality Date  . COLONOSCOPY    . COLONOSCOPY N/A 03/24/2013   Procedure: COLONOSCOPY;  Surgeon: Rogene Houston, MD;  Location: [facility];  Service: Endoscopy;  Laterality: N/A;  830  . LAMINECTOMY     Lumbar  . PROSTATE BIOPSY    . THYROID SURGERY  1999   L lobectomy, complicated by esophageal laceration requiring esophageal repair     Social History   Social History  . Marital status: Married    Spouse name: N/A  . Number of children: 2  . Years of education: N/A   Occupational History  . Retired     Pharmacist, hospital   Social History Main Topics  . Smoking status: Never Smoker  . Smokeless tobacco: Never Used  . Alcohol use 0.0 oz/week     Comment: 1 beer monthly  . Drug use: No  . Sexual activity: Yes    Birth control/ protection: None   Other Topics Concern  . Not on file   Social History Narrative   Lives at home with wife in [place].   Retired Transport planner.  Enjoys traveling and photography.    No Known Allergies  Family History  Problem Relation Age of Onset  . Hypertension Father   . Heart attack Father 40  . Pelvic inflammatory disease Father   . Hypertension Mother   . Stroke Mother   . Deep vein thrombosis Mother   . Arthritis Brother   . Pancreatic cancer Paternal Grandfather   . Heart attack Maternal Grandfather 56    Prior to Admission medications  Medication Sig Start Date End Date Taking? Authorizing Provider  aspirin 81 MG tablet Take 81 mg by mouth daily.   Yes [provider]  finasteride (PROSCAR) 5 MG tablet Take 5 mg by mouth daily.   Yes [provider]  fish oil-omega-3 fatty acids 1000 MG capsule Take 2 g by mouth daily.   Yes [provider]  ibuprofen (ADVIL,MOTRIN) 200 MG tablet Take 400 mg by mouth every 6 (six) hours as needed for headache.   Yes [provider]  lisinopril (PRINIVIL,ZESTRIL) 10 MG tablet Take 10 mg  by mouth daily.  10/26/15  Yes [provider]  metoprolol succinate (TOPROL-XL) 25 MG 24 hr tablet take 1 tablet by mouth once daily 10/23/16  Yes Satira Sark, MD  Multiple Vitamin (MULTIVITAMIN) tablet Take 1 tablet by mouth daily.   Yes [provider]  multivitamin-lutein (OCUVITE-LUTEIN) CAPS capsule Take 1 capsule by mouth daily.   Yes [provider]  simvastatin (ZOCOR) 20 MG tablet Take 20 mg by mouth daily.   Yes [provider]  tamsulosin (FLOMAX) 0.4 MG CAPS capsule Take 0.4 mg by mouth daily after supper.  01/20/14  Yes [provider]    Physical Exam: Vitals:   03/17/17 1500 03/17/17 1542 03/17/17 1600 03/17/17 1625  BP: (!) 161/94 (!) 182/93 (!) 172/103 (!) 160/97  Pulse: 60 63 (!) 57   Resp: 13 15 13    Temp:      TempSrc:      SpO2: 97% 100% 97%   Weight:      Height:         General:  Appears calm and comfortable, sitting  Up in bed in no acute distress Eyes:  PERRL, EOMI, normal lids, iris ENT:  grossly normal hearing, lips & tongue, his membranes of his mouth are moist and pink Neck:  no LAD, masses or thyromegaly Cardiovascular:  RRR, no m/r/g. No LE edema. Pedal pulses present and palpable Respiratory:  CTA bilaterally, no w/r/r. Normal respiratory effort. Abdomen:  soft, ntnd,  Positive bowel sounds throughout no guarding or rebounding Skin:  no rash or induration seen on limited exam Musculoskeletal:  grossly normal tone BUE/BLE, good ROM, no bony abnormality Psychiatric:  grossly normal mood and affect, speech fluent and appropriate, AOx3 Neurologic:  CN 2-12 grossly intact, moves all extremities in coordinated fashion, sensation intact  Labs on Admission: I have personally reviewed following labs and imaging studies  CBC:  Recent Labs Lab 03/17/17 1200  WBC 6.4  HGB 13.8  HCT 41.8  MCV 89.1  PLT 924   Basic Metabolic Panel:  Recent Labs Lab 03/17/17 1200  NA 138  K 4.1  CL 107  CO2 23    GLUCOSE 99  BUN 14  CREATININE 1.04  CALCIUM 9.2   GFR: Estimated Creatinine Clearance: 95.6 mL/min (by C-G formula based on SCr of 1.04 mg/dL). Liver Function Tests: No results for input(s): AST, ALT, ALKPHOS, BILITOT, PROT, ALBUMIN in the last 168 hours. No results for input(s): LIPASE, AMYLASE in the last 168 hours. No results for input(s): AMMONIA in the last 168 hours. Coagulation Profile: No results for input(s): INR, PROTIME in the last 168 hours. Cardiac Enzymes:  Recent Labs Lab 03/17/17 1315  TROPONINI <0.03   BNP (last 3 results) No results for input(s): PROBNP in the last 8760 hours. HbA1C: No results for input(s): HGBA1C in the last 72 hours. CBG: No results for input(s): GLUCAP in the last 168 hours. Lipid Profile:  No results for input(s): CHOL, HDL, LDLCALC, TRIG, CHOLHDL, LDLDIRECT in the last 72 hours. Thyroid Function Tests: No results for input(s): TSH, T4TOTAL, FREET4, T3FREE, THYROIDAB in the last 72 hours. Anemia Panel: No results for input(s): VITAMINB12, FOLATE, FERRITIN, TIBC, IRON, RETICCTPCT in the last 72 hours. Urine analysis:    Component Value Date/Time   COLORURINE STRAW (A) 03/17/2017 1300   APPEARANCEUR CLEAR 03/17/2017 1300   LABSPEC 1.006 03/17/2017 1300   PHURINE 7.0 03/17/2017 1300   GLUCOSEU NEGATIVE 03/17/2017 1300   HGBUR NEGATIVE 03/17/2017 1300   BILIRUBINUR NEGATIVE 03/17/2017 1300   KETONESUR NEGATIVE 03/17/2017 1300   PROTEINUR NEGATIVE 03/17/2017 1300   NITRITE NEGATIVE 03/17/2017 1300   LEUKOCYTESUR NEGATIVE 03/17/2017 1300    Creatinine Clearance: Estimated Creatinine Clearance: 95.6 mL/min (by C-G formula based on SCr of 1.04 mg/dL).  Sepsis Labs: @LABRCNTIP (procalcitonin:4,lacticidven:4) )No results found for this or any previous visit (from the past 240 hour(s)).   Radiological Exams on Admission: Dg Chest 2 View  Result Date: 03/17/2017 CLINICAL DATA:  Intermittent chest tightness with activity. EXAM:  CHEST  2 VIEW COMPARISON:  01/24/2010. FINDINGS: Normal sized heart. Tortuous aorta. Mild diffuse peribronchial thickening. Thoracic spine degenerative changes, including changes of DISH. IMPRESSION: Mild chronic bronchitic changes.  No acute abnormality. Electronically Signed   By: Claudie Revering M.D.   On: 03/17/2017 13:50    EKG: Independently reviewed. Normal sinus rhythm Right bundle branch block Abnormal ECG no previous EKG Confirmed by Brantley Stage  Assessment/Plan Principal Problem:   DOE (dyspnea on exertion) Active Problems:   HTN (hypertension)   Hyperlipidemia   Chest pain   #1. Dyspnea with exertion/chest pain. Heart score 5. Initial troponin negative. EKG normal sinus rhythm right bundle branch block. Chest x-ray mild chronic bronchitic changes. Echo 2017 reveals an EF of 15% grade 1 diastolic dysfunction wall thickness was at the upper limits of normal. Of note chart review indicates patient saw primary cardiologist Dr. Domenic Polite June 1 at that time he reported occasional episodes of palpitations and no shortness of breath. There were no changes in medications at that time Admit to telemetry -Cycle troponin -serial ekg -Continue statin -Aspirin -stress test tomorrow -npo past midnight  #2. Hypertension. Blood pressure mildly elevated in the emergency department. I medications include lisinopril and metoprolol -We'll continue lisinopril and metoprolol -Monitor blood pressure  #3. Hyperlipidemia -Lipid panel -Continue statin   DVT prophylaxis: lovenox Code Status: full  Family Communication: none present  Disposition Plan: home hopefully tomorrow after stress test  Consults called: none  Admission status: obs.    Radene Gunning MD [facility]  If 7PM-7AM, please contact night-coverage [email or web address] Password [facility]  03/17/2017, 4:43 PM

## 2017-03-18 ENCOUNTER — Observation Stay (HOSPITAL_BASED_OUTPATIENT_CLINIC_OR_DEPARTMENT_OTHER): Payer: Medicare Other

## 2017-03-18 ENCOUNTER — Encounter (HOSPITAL_COMMUNITY): Payer: Self-pay | Admitting: Cardiology

## 2017-03-18 DIAGNOSIS — R079 Chest pain, unspecified: Secondary | ICD-10-CM | POA: Diagnosis not present

## 2017-03-18 DIAGNOSIS — R0609 Other forms of dyspnea: Secondary | ICD-10-CM

## 2017-03-18 DIAGNOSIS — R42 Dizziness and giddiness: Secondary | ICD-10-CM | POA: Diagnosis not present

## 2017-03-18 DIAGNOSIS — E785 Hyperlipidemia, unspecified: Secondary | ICD-10-CM

## 2017-03-18 DIAGNOSIS — I10 Essential (primary) hypertension: Secondary | ICD-10-CM

## 2017-03-18 LAB — MAGNESIUM: Magnesium: 2.2 mg/dL (ref 1.7–2.4)

## 2017-03-18 LAB — NM MYOCAR MULTI W/SPECT W/WALL MOTION / EF
CSEPEDS: 0 s
CSEPEW: 1 METS
CSEPHR: 62 %
CSEPPHR: 93 {beats}/min
Exercise duration (min): 0 min
MPHR: 150 {beats}/min
Rest HR: 60 {beats}/min

## 2017-03-18 MED ORDER — REGADENOSON 0.4 MG/5ML IV SOLN
0.4000 mg | Freq: Once | INTRAVENOUS | Status: AC
  Administered 2017-03-18: 0.4 mg via INTRAVENOUS
  Filled 2017-03-18: qty 5

## 2017-03-18 MED ORDER — HYDRALAZINE HCL 20 MG/ML IJ SOLN: 10.0000 mg | Freq: Four times a day (QID) | INTRAMUSCULAR | Status: DC | PRN

## 2017-03-18 MED ORDER — METOPROLOL SUCCINATE ER 50 MG PO TB24: 50.0000 mg | ORAL_TABLET | Freq: Every day | ORAL | Status: DC

## 2017-03-18 MED ORDER — TECHNETIUM TC 99M TETROFOSMIN IV KIT
10.0000 | PACK | Freq: Once | INTRAVENOUS | Status: AC | PRN
  Administered 2017-03-18: 10 via INTRAVENOUS

## 2017-03-18 MED ORDER — TECHNETIUM TC 99M TETROFOSMIN IV KIT
30.0000 | PACK | Freq: Once | INTRAVENOUS | Status: AC | PRN
  Administered 2017-03-18: 30 via INTRAVENOUS

## 2017-03-18 MED ORDER — METOPROLOL SUCCINATE ER 50 MG PO TB24: 50.0000 mg | ORAL_TABLET | Freq: Every day | ORAL | 0 refills | Status: DC

## 2017-03-18 MED ORDER — REGADENOSON 0.4 MG/5ML IV SOLN
INTRAVENOUS | Status: AC
  Filled 2017-03-18: qty 5

## 2017-03-18 NOTE — Progress Notes (Signed)
Importance of bath in the hospital even though he thought he was going home

## 2017-03-18 NOTE — Discharge Summary (Signed)
Physician Discharge Summary  JAVARIE CRISP KZS:[id number] DOB: [birth year] DOA: 03/17/2017  PCP: Manon Hilding, MD  Admit date: 03/17/2017 Discharge date: 03/18/2017   Recommendations for Outpatient Follow-Up:   1. Close outpatient follow up with cardiology- increased toprol XL 2. Check TSH outpatient   Discharge Diagnosis:   Principal Problem:   DOE (dyspnea on exertion) Active Problems:   HTN (hypertension)   Hyperlipidemia   Chest pain   Nonspecific chest pain   Discharge disposition:  Home  Discharge Condition: Improved.  Diet recommendation: Low sodium, heart healthy.  Wound care: None.   History of Present Illness:   Jonathan Lamb is a delightful 71 y.o. male with medical history significant for atrial tachycardia, hyperlipidemia, hypertension, right bundle branch block BPH since to the emergency Department chief complaint of dyspnea with exertion. Initial evaluation concerning for ACS. Patient being admitted for chest pain rule out.  Information is obtained from the patient. He reports for the last 2-3 weeks he's had intermittent dyspnea with exertion. Associated symptoms include lightheadedness to the point he feels as if "I could passout" and chest "tightness".. He reports walking long distances and going up a few steps make him short of breath and dizzy. In addition some position changes particularly sitting to standing he experiences dizziness. He describes the tightness in his chest located anteriorly nonradiating. He states the chest discomfort is much less problematic to him compared to the dyspnea. He denies any worsening lower extremity edema. He denies any headache syncope or near-syncope. He denies fever chills nausea vomiting diarrhea. He denies dysuria hematuria frequency or urgency. He denies constipation diarrhea melena or bright red blood per rectum.   Hospital Course by Problem:    Presyncope/Chest Pain - the patient reports having episodes of  dizziness with exertion for the past 3 weeks. Says he develops lightheadedness and associated presyncope which requires him to sit down and rest. Reports associated palpitations and chest pressure. Symptoms are identical to what he has experienced with prior episodes of SVT, but are now increasing in frequency and severity. EKG shows sinus bradycardia, HR 56, with known RBBB.  - low risk stress test.   Paroxysmal SVT - noted on event monitor in 10/2015. Was evaluated by EP was episodes were thought to be secondary to RP SVT which occurred with vagal and adrenergic activity.  - increase BB therapy with increasing Toprol-XL from 25 mg daily to 50mg  daily. -per cards: if symptoms persist, he would benefit from further evaluation with Dr. Rayann Heman for consideration of antiarrhythmic therapy or possible ablation.   Accelerated HTN  - continue Lisinopril 10mg  daily with titration of Toprol-XL to 50mg  daily as above. Marland Kitchen    HLD - Simvastatin 20mg  daily.    Medical Consultants:    cards   Discharge Exam:   Vitals:   03/18/17 1118 03/18/17 1325  BP: (!) 203/94 (!) 155/93  Pulse: 79 73  Resp:    Temp:  98.4 F (36.9 C)   Vitals:   03/18/17 0952 03/18/17 0953 03/18/17 1118 03/18/17 1325  BP: (!) 155/87 (!) 161/93 (!) 203/94 (!) 155/93  Pulse:   79 73  Resp:      Temp:    98.4 F (36.9 C)  TempSrc:    Oral  SpO2:    96%  Weight:      Height:        Gen:  NAD    The results of significant diagnostics from this hospitalization (including imaging, microbiology, ancillary  and laboratory) are listed below for reference.     Procedures and Diagnostic Studies:   Dg Chest 2 View  Result Date: 03/17/2017 CLINICAL DATA:  Intermittent chest tightness with activity. EXAM: CHEST  2 VIEW COMPARISON:  01/24/2010. FINDINGS: Normal sized heart. Tortuous aorta. Mild diffuse peribronchial thickening. Thoracic spine degenerative changes, including changes of DISH. IMPRESSION: Mild chronic  bronchitic changes.  No acute abnormality. Electronically Signed   By: Claudie Revering M.D.   On: 03/17/2017 13:50     Labs:   Basic Metabolic Panel:  Recent Labs Lab 03/17/17 1200 03/18/17 1309  NA 138  --   K 4.1  --   CL 107  --   CO2 23  --   GLUCOSE 99  --   BUN 14  --   CREATININE 1.04  --   CALCIUM 9.2  --   MG  --  2.2   GFR Estimated Creatinine Clearance: 95.1 mL/min (by C-G formula based on SCr of 1.04 mg/dL). Liver Function Tests: No results for input(s): AST, ALT, ALKPHOS, BILITOT, PROT, ALBUMIN in the last 168 hours. No results for input(s): LIPASE, AMYLASE in the last 168 hours. No results for input(s): AMMONIA in the last 168 hours. Coagulation profile No results for input(s): INR, PROTIME in the last 168 hours.  CBC:  Recent Labs Lab 03/17/17 1200  WBC 6.4  HGB 13.8  HCT 41.8  MCV 89.1  PLT 213   Cardiac Enzymes:  Recent Labs Lab 03/17/17 1315 03/17/17 1617 03/17/17 1816 03/17/17 2129  TROPONINI <0.03 <0.03 <0.03 <0.03   BNP: Invalid input(s): POCBNP CBG: No results for input(s): GLUCAP in the last 168 hours. D-Dimer No results for input(s): DDIMER in the last 72 hours. Hgb A1c No results for input(s): HGBA1C in the last 72 hours. Lipid Profile  Recent Labs  03/16/17 1300  CHOL 147  HDL 42  LDLCALC 76  TRIG 146  CHOLHDL 3.5   Thyroid function studies No results for input(s): TSH, T4TOTAL, T3FREE, THYROIDAB in the last 72 hours.  Invalid input(s): FREET3 Anemia work up No results for input(s): VITAMINB12, FOLATE, FERRITIN, TIBC, IRON, RETICCTPCT in the last 72 hours. Microbiology No results found for this or any previous visit (from the past 240 hour(s)).   Discharge Instructions:   Discharge Instructions    Diet - low sodium heart healthy    Complete by:  As directed    Increase activity slowly    Complete by:  As directed      Allergies as of 03/18/2017   No Known Allergies     Medication List    TAKE these  medications   aspirin 81 MG tablet Take 81 mg by mouth daily.   finasteride 5 MG tablet Commonly known as:  PROSCAR Take 5 mg by mouth daily.   fish oil-omega-3 fatty acids 1000 MG capsule Take 2 g by mouth daily.   ibuprofen 200 MG tablet Commonly known as:  ADVIL,MOTRIN Take 400 mg by mouth every 6 (six) hours as needed for headache.   lisinopril 10 MG tablet Commonly known as:  PRINIVIL,ZESTRIL Take 10 mg by mouth daily.   metoprolol succinate 50 MG 24 hr tablet Commonly known as:  TOPROL-XL Take 1 tablet (50 mg total) by mouth daily. Take with or immediately following a meal. What changed:  medication strength  See the new instructions.   multivitamin tablet Take 1 tablet by mouth daily.   multivitamin-lutein Caps capsule Take 1 capsule by mouth daily.  simvastatin 20 MG tablet Commonly known as:  ZOCOR Take 20 mg by mouth daily.   tamsulosin 0.4 MG Caps capsule Commonly known as:  FLOMAX Take 0.4 mg by mouth daily after supper.      Follow-up Information    Satira Sark, MD Follow up.   Specialty:  Cardiology Why:  Cardiology Hospital Follow-Up on 04/15/2017 at 11:00AM.   [facility]! Contact information: [place] [address] [phone]        Manon Hilding, MD Follow up in 1 week(s).   Specialty:  Family Medicine Contact information: [place] [address] [phone]            Time coordinating discharge: 35 min  Signed:  Abdulkareem Badolato Alison Stalling   [facility] 03/18/2017, 4:29 PM

## 2017-03-18 NOTE — Consult Note (Signed)
Cardiology Consult    Patient ID: Jonathan Lamb MRN: [id number], DOB/AGE: 1946/06/04   Admit date: 03/17/2017 Date of Consult: 03/18/2017  Primary Physician: Manon Hilding, MD Reason for Consult: Chest Pain  Primary Cardiologist: Dr. Domenic Polite Requesting Provider: Dr. Rayann Heman   History of Present Illness    Jonathan Lamb is a 71 y.o. male with past medical history of HTN, HLD and pSVT who is being seen today for the evaluation of chest pain at the request of Dr. Eliseo Squires.  He was last examined by Dr. Domenic Polite in 01/2017 and reported doing well from a cardiac perspective at that time. Denied any recent chest pain or dyspnea on exertion. Did report occasional palpitations but no associated dizziness or presyncope. He was continued on Toprol-XL 98m daily for SVT and Lisinopril 155mdaily for HTN.   The [facility] for evaluation of chest pain and dyspnea for the past few weeks. He reports having episodes of dizziness with exertion for the past 3 weeks. Says he develops lightheadedness and associated presyncope which requires him to sit down and rest. Reports associated palpitations with pressure along his chest when this occurs, saying it resolves with rest. Symptoms frequently occur when push-mowing his yard or climbing stairs. Says they are identical to what he has experienced with prior episodes of SVT, but are now increasing in frequency and severity. No recent dyspnea on exertion, orthopnea, PND or lower extremity edema.   Initial labs show WBC of 6.4, Hgb 13.8, platelets 213. Na+ 138, K+ 4.1, creatinine 1.04. Cyclic troponin values have been negative. CXR shows mild chronic bronchitic changes with no acute abnormalities. EKG shows sinus bradycardia, HR 56, with known RBBB.    He was evaluated by EP in 12/2015 for SVT and review of his recent monitor showing symptomatic RP SVT with tachycardia occurring with vagal and adrenergic activity. With  improvement of his symptoms with Lopressor, no further intervention was recommended at that time.    Past Medical History   Past Medical History:  Diagnosis Date  . Arthritis   . Benign prostatic hyperplasia   . DOE (dyspnea on exertion)   . Essential hypertension   . Hemorrhoid   . Hypercholesterolemia   . RBBB   . SVT (supraventricular tachycardia) (HCC)    Atrial tachycardia    Past Surgical History:  Procedure Laterality Date  . COLONOSCOPY    . COLONOSCOPY N/A 03/24/2013   Procedure: COLONOSCOPY;  Surgeon: NaRogene HoustonMD;  Location: [facility];  Service: Endoscopy;  Laterality: N/A;  830  . LAMINECTOMY     Lumbar  . PROSTATE BIOPSY    . THYROID SURGERY  1999   L lobectomy, complicated by esophageal laceration requiring esophageal repair      Allergies  No Known Allergies  Inpatient Medications    . regadenoson      . aspirin EC  325 mg Oral Daily  . enoxaparin (LOVENOX) injection  40 mg Subcutaneous Q24H  . finasteride  5 mg Oral Daily  . lisinopril  10 mg Oral Daily  . metoprolol succinate  25 mg Oral Daily  . simvastatin  20 mg Oral Daily  . tamsulosin  0.4 mg Oral QPC supper    Family History    Family History  Problem Relation Age of Onset  . Hypertension Father   . Heart attack Father 7478. Pelvic inflammatory disease Father   . Hypertension Mother   .  Stroke Mother   . Deep vein thrombosis Mother   . Arthritis Brother   . Pancreatic cancer Paternal Grandfather   . Heart attack Maternal Grandfather 56    Social History    Social History   Social History  . Marital status: Married    Spouse name: N/A  . Number of children: 2  . Years of education: N/A   Occupational History  . Retired     Pharmacist, hospital   Social History Main Topics  . Smoking status: Never Smoker  . Smokeless tobacco: Never Used  . Alcohol use 0.0 oz/week     Comment: 1 beer monthly  . Drug use: No  . Sexual activity: Yes    Birth control/ protection: None    Other Topics Concern  . Not on file   Social History Narrative   Lives at home with wife in [place].   Retired Transport planner.  Enjoys traveling and photography.     Review of Systems    General:  No chills, fever, night sweats or weight changes.  Cardiovascular:  No dyspnea on exertion, edema, orthopnea, palpitations, paroxysmal nocturnal dyspnea. Positive for chest pain.  Dermatological: No rash, lesions/masses Respiratory: No cough, dyspnea Urologic: No hematuria, dysuria Abdominal:   No nausea, vomiting, diarrhea, bright red blood per rectum, melena, or hematemesis Neurologic:  No visual changes, wkns, changes in mental status. Positive for dizziness and presyncope.   All other systems reviewed and are otherwise negative except as noted above.  Physical Exam    Blood pressure (!) 155/93, pulse 73, temperature 98.4 F (36.9 C), temperature source Oral, resp. rate 15, height _0  (1.93 m), weight 273 lb 9.6 oz (124.1 kg), SpO2 96 %.  General: Pleasant Caucasian male appearing in NAD Psych: Normal affect. Neuro: Alert and oriented X 3. Moves all extremities spontaneously. HEENT: Normal  Neck: Supple without bruits or JVD. Lungs:  Resp regular and unlabored, CTA without wheezing or rales. Heart: RRR no s3, s4, or murmurs. Abdomen: Soft, non-tender, non-distended, BS + x 4.  Extremities: No clubbing, cyanosis or edema. DP/PT/Radials 2+ and equal bilaterally.  Labs    Troponin (Point of Care Test) No results for input(s): TROPIPOC in the last 72 hours.  Recent Labs  03/17/17 1315 03/17/17 1617 03/17/17 1816 03/17/17 2129  TROPONINI <0.03 <0.03 <0.03 <0.03   Lab Results  Component Value Date   WBC 6.4 03/17/2017   HGB 13.8 03/17/2017   HCT 41.8 03/17/2017   MCV 89.1 03/17/2017   PLT 213 03/17/2017    Recent Labs Lab 03/17/17 1200  NA 138  K 4.1  CL 107  CO2 23  BUN 14  CREATININE 1.04  CALCIUM 9.2  GLUCOSE 99   Lab Results  Component Value  Date   CHOL 147 03/16/2017   HDL 42 03/16/2017   LDLCALC 76 03/16/2017   TRIG 146 03/16/2017   No results found for: [facility]   Radiology Studies    Dg Chest 2 View  Result Date: 03/17/2017 CLINICAL DATA:  Intermittent chest tightness with activity. EXAM: CHEST  2 VIEW COMPARISON:  01/24/2010. FINDINGS: Normal sized heart. Tortuous aorta. Mild diffuse peribronchial thickening. Thoracic spine degenerative changes, including changes of DISH. IMPRESSION: Mild chronic bronchitic changes.  No acute abnormality. Electronically Signed   By: Claudie Revering M.D.   On: 03/17/2017 13:50    EKG & Cardiac Imaging    EKG: Sinus bradycardia, HR 56, with known RBBB.   - Personally Reviewed  Echocardiogram: 10/19/2015  Study Conclusions  - Left ventricle: The cavity size was normal. Wall thickness was at the upper limits of normal. Systolic function was normal. The estimated ejection fraction was in the range of 55% to 60%. Doppler parameters are consistent with abnormal left ventricular relaxation (grade 1 diastolic dysfunction). - Aortic valve: Mildly calcified annulus. Trileaflet; mildly calcified leaflets. - Aortic root: Mildly ectatic ascending aorta. - Mitral valve: Calcified annulus. There was trivial regurgitation. - Left atrium: The atrium was at the upper limits of normal in size. - Right atrium: The atrium was at the upper limits of normal in size. - Atrial septum: No defect or patent foramen ovale was identified. - Tricuspid valve: There was physiologic regurgitation. Peak RV-RA gradient (S): 9 mm Hg. - Pulmonary arteries: Systolic pressure could not be accurately estimated. - Pericardium, extracardiac: There was no pericardial effusion.  Impressions:  - Upper normal LV wall thickness with LVEF 55-60%. Grade 1 diastolic dysfunction with normal estimated LV filling pressure. Upper normal left atrial chamber size. MAC with trivial mitral regurgitation.  Mildly ectatic ascending aorta. Upper normal right atrial chamber size.  Assessment & Plan    1. Presyncope/Chest Pain - the patient reports having episodes of dizziness with exertion for the past 3 weeks. Says he develops lightheadedness and associated presyncope which requires him to sit down and rest. Reports associated palpitations and chest pressure. Symptoms are identical to what he has experienced with prior episodes of SVT, but are now increasing in frequency and severity. EKG shows sinus bradycardia, HR 56, with known RBBB.   - CBC and BMET are within normal limits. Mg pending. Will add-on a TSH.  - a NST was performed this morning and results are pending. Overall, his symptoms seem most concerning for an arrhythmia but I agree in ruling out an ischemic etiology. If stress test is low-risk, would not pursue further ischemic evaluation at this time.   2. Paroxysmal SVT - noted on event monitor in 10/2015. Was evaluated by EP was episodes were thought to be secondary to RP SVT which occurred with vagal and adrenergic activity.  - would recommend titration of his BB therapy with increasing Toprol-XL from 25 mg daily to 47m daily. If symptoms persist, he would benefit from further evaluation with Dr. ARayann Hemanfor consideration of antiarrhythmic therapy or possible ablation.   3. Accelerated HTN  - BP variable at 120/73 - 203/105 in the past 24 hours. Improved to 155/93 on most recent check. Reading of 203/105 was taken after climbing stairs. He reports variable BP at home as well but says it is usually in the 140's/90's.   - continue Lisinopril 122mdaily with titration of Toprol-XL to 5075maily as above. .   4. HLD - continue Simvastatin 4m57mily.    Signed, BritErma Heritage-C 03/18/2017, 1:49 PM Pager: [phone] and all data above reviewed.  Patient examined.  I agree with the findings as above.  The patient has been having increasing symptoms of dizziness  with activity.  He was walked up 3 flights of stairs today without reproducing these symptoms.  However, he says that activity like mowing his small lawn would make them happen.  He says that his vision will turn "tunnel" like and he will feel his heart racing.  The heart racing seems to proceed the dizziness.  He thinks this is similar to his SVT. he had had some chest tightness.   The patient exam reveals COR:RRR  ,  Lungs: Clear  ,  Abd: Positive bowel sounds, no rebound no guarding, Ext no edema  .  All available labs, radiology testing, previous records reviewed. Agree with documented assessment and plan. Chest pain:  This is atypical.  Lexiscan Myoview results pending.  No further work up if negative.  Dizziness:  We can start by trying to correlate symptoms with arrhythmia.  I suggested that he buy and Alive Cor event monitor.  For now we increased the beta blocker.  He would like to follow with Dr. Domenic Polite after the admission.   Jeneen Rinks Evangelos Paulino  3:21 PM  03/18/2017

## 2017-03-18 NOTE — Progress Notes (Signed)
No acute events overnight. Patient slept, no chest pain. Patient has been NPO after midnight. Ready for procedure. Will continue to monitor.  Daxtin Leiker, RN

## 2017-03-18 NOTE — Progress Notes (Signed)
PROGRESS NOTE    Jonathan Lamb  ZDG:[id number] DOB: 08/04/1946 DOA: 03/17/2017 PCP: Manon Hilding, MD   Outpatient Specialists:     Brief Narrative:  Jonathan Lamb is a delightful 71 y.o. male with medical history significant for atrial tachycardia, hyperlipidemia, hypertension, right bundle branch block, who was sent to the emergency Department chief complaint of dyspnea with exertion and dizziness.     Assessment & Plan:   Principal Problem:   DOE (dyspnea on exertion) Active Problems:   HTN (hypertension)   Hyperlipidemia   Chest pain   Nonspecific chest pain   Dyspnea and diziness with exertion -Heart score 5 -CE negative -chart review indicates patient saw primary cardiologist Dr. Domenic Polite June 1 at that time he reported occasional episodes of palpitations and no shortness of breath. There were no changes in medications at that time -stress test pending -recently on steroid taper for sciatica  H/o SVT -placed on metoprolol  Hypertension.  -on lisinopril and metoprolol -per patient, his BP swings from high to lows (SBP in the 60s-- this accompanied by dizziness)  Hyperlipidemia -LDL 76 -Continue statin   DVT prophylaxis:  Lovenox   Code Status: Full Code   Family Communication:   Disposition Plan:     Consultants:  cards  Subjective: Only gets dizziness with exertion-- walking up stairs, mowing yard  Objective: Vitals:   03/18/17 0950 03/18/17 0952 03/18/17 0953 03/18/17 1118  BP: (!) 180/73 (!) 155/87 (!) 161/93 (!) 203/94  Pulse:    79  Resp:      Temp:      TempSrc:      SpO2:      Weight:      Height:        Intake/Output Summary (Last 24 hours) at 03/18/17 1239 Last data filed at 03/18/17 0850  Gross per 24 hour  Intake              480 ml  Output             1150 ml  Net             -670 ml   Filed Weights   03/17/17 1217 03/17/17 1647 03/18/17 0619  Weight: 125.6 kg (277 lb) 124.2 kg (273 lb 14.4 oz) 124.1 kg  (273 lb 9.6 oz)    Examination:  General exam: Appears calm and comfortable  Respiratory system: Clear to auscultation. Respiratory effort normal. Cardiovascular system: S1 & S2 heard, RRR. No JVD, murmurs, rubs, gallops or clicks. No pedal edema. Gastrointestinal system: Abdomen is nondistended, soft and nontender. No organomegaly or masses felt. Normal bowel sounds heard. Central nervous system: Alert and oriented. No focal neurological deficits. Extremities: Symmetric 5 x 5 power. Skin: No rashes, lesions or ulcers Psychiatry: Judgement and insight appear normal. Mood & affect appropriate.     Data Reviewed: I have personally reviewed following labs and imaging studies  CBC:  Recent Labs Lab 03/17/17 1200  WBC 6.4  HGB 13.8  HCT 41.8  MCV 89.1  PLT 951   Basic Metabolic Panel:  Recent Labs Lab 03/17/17 1200  NA 138  K 4.1  CL 107  CO2 23  GLUCOSE 99  BUN 14  CREATININE 1.04  CALCIUM 9.2   GFR: Estimated Creatinine Clearance: 95.1 mL/min (by C-G formula based on SCr of 1.04 mg/dL). Liver Function Tests: No results for input(s): AST, ALT, ALKPHOS, BILITOT, PROT, ALBUMIN in the last 168 hours. No results for input(s): LIPASE, AMYLASE in  the last 168 hours. No results for input(s): AMMONIA in the last 168 hours. Coagulation Profile: No results for input(s): INR, PROTIME in the last 168 hours. Cardiac Enzymes:  Recent Labs Lab 03/17/17 1315 03/17/17 1617 03/17/17 1816 03/17/17 2129  TROPONINI <0.03 <0.03 <0.03 <0.03   BNP (last 3 results) No results for input(s): PROBNP in the last 8760 hours. HbA1C: No results for input(s): HGBA1C in the last 72 hours. CBG: No results for input(s): GLUCAP in the last 168 hours. Lipid Profile:  Recent Labs  03/16/17 1300  CHOL 147  HDL 42  LDLCALC 76  TRIG 146  CHOLHDL 3.5   Thyroid Function Tests: No results for input(s): TSH, T4TOTAL, FREET4, T3FREE, THYROIDAB in the last 72 hours. Anemia Panel: No  results for input(s): VITAMINB12, FOLATE, FERRITIN, TIBC, IRON, RETICCTPCT in the last 72 hours. Urine analysis:    Component Value Date/Time   COLORURINE STRAW (A) 03/17/2017 1300   APPEARANCEUR CLEAR 03/17/2017 1300   LABSPEC 1.006 03/17/2017 1300   PHURINE 7.0 03/17/2017 1300   GLUCOSEU NEGATIVE 03/17/2017 1300   HGBUR NEGATIVE 03/17/2017 1300   BILIRUBINUR NEGATIVE 03/17/2017 1300   KETONESUR NEGATIVE 03/17/2017 1300   PROTEINUR NEGATIVE 03/17/2017 1300   NITRITE NEGATIVE 03/17/2017 1300   LEUKOCYTESUR NEGATIVE 03/17/2017 1300     )No results found for this or any previous visit (from the past 240 hour(s)).    Anti-infectives    None       Radiology Studies: Dg Chest 2 View  Result Date: 03/17/2017 CLINICAL DATA:  Intermittent chest tightness with activity. EXAM: CHEST  2 VIEW COMPARISON:  01/24/2010. FINDINGS: Normal sized heart. Tortuous aorta. Mild diffuse peribronchial thickening. Thoracic spine degenerative changes, including changes of DISH. IMPRESSION: Mild chronic bronchitic changes.  No acute abnormality. Electronically Signed   By: Claudie Revering M.D.   On: 03/17/2017 13:50        Scheduled Meds: . regadenoson      . aspirin EC  325 mg Oral Daily  . enoxaparin (LOVENOX) injection  40 mg Subcutaneous Q24H  . finasteride  5 mg Oral Daily  . lisinopril  10 mg Oral Daily  . metoprolol succinate  25 mg Oral Daily  . simvastatin  20 mg Oral Daily  . tamsulosin  0.4 mg Oral QPC supper   Continuous Infusions:   LOS: 0 days    Time spent: 35 min    [place], DO [facility] Pager [phone]  If 7PM-7AM, please contact night-coverage [email or web address] Password [facility] 03/18/2017, 12:39 PM

## 2017-03-18 NOTE — Progress Notes (Signed)
   Jonathan Lamb presented for a 1-day Lexiscan cardiolite today.  No immediate complications.  Stress imaging is pending at this time.  Erma Heritage, PA-C 03/18/2017, 9:54 AM

## 2017-03-18 NOTE — Progress Notes (Signed)
    Notified by Dr. Meda Coffee that the patient's stress test was normal (has not crossed over to Epic). Patient and admitting team made aware. Will increase Toprol-XL as discussed in consult note. Follow-up with Dr. Domenic Polite has been arranged.   Signed, Erma Heritage, PA-C 03/18/2017, 4:22 PM Pager: [phone]

## 2017-03-18 NOTE — Progress Notes (Signed)
Pt has orders to be discharged. Discharge instructions given and pt has no additional questions at this time. Medication regimen reviewed and pt educated. Pt verbalized understanding and has no additional questions. Telemetry box removed. IV removed and site in good condition. Pt stable and waiting for transportation.  Nickia Boesen RN 

## 2017-03-24 NOTE — Progress Notes (Signed)
Cardiology Office Note  Date: 03/25/2017   ID: JEFFORY SNELGROVE, DOB [birth year], MRN [id number]  PCP: Manon Hilding, MD  Primary Cardiologist: Rozann Lesches, MD   Chief Complaint  Patient presents with  . Atrial tachycardia    History of Present Illness: Jonathan Lamb is a 71 y.o. male seen recently in June. At that time he was clinically stable on medical therapy. Interval records indicate presentation to the [facility] ER on July 16 for evaluation of chest pain, shortness of breath and lightheadedness with exertion - symptoms very similar to his prior presentation with SVT. He was seen in consultation by Dr. Percival Spanish, ruled out for ACS, and underwent a negative Lexiscan Myoview that was low risk for ischemia with normal LVEF. No arrhythmia documented during hospital monitoring, but he was discharged on higher dose Toprol-XL at 50 mg daily.  He is here today with his wife for a follow-up visit. He states that he had "two good days" after discharge but yesterday had recurring symptoms. He reports compliance with Toprol-XL at 50 mg daily. He has been checking his blood pressure and heart rate at home, after 3 spells he did note relative hypotension.  Past Medical History:  Diagnosis Date  . Arthritis   . Benign prostatic hyperplasia   . Essential hypertension   . Hemorrhoid   . Hypercholesterolemia   . RBBB   . SVT (supraventricular tachycardia) (HCC)    Atrial tachycardia    Past Surgical History:  Procedure Laterality Date  . COLONOSCOPY N/A 03/24/2013   Procedure: COLONOSCOPY;  Surgeon: Rogene Houston, MD;  Location: [facility];  Service: Endoscopy;  Laterality: N/A;  830  . LAMINECTOMY     Lumbar  . PROSTATE BIOPSY    . THYROID SURGERY  1999   L lobectomy, complicated by esophageal laceration requiring esophageal repair     Current Outpatient Prescriptions  Medication Sig Dispense Refill  . aspirin 81 MG tablet Take 81 mg by mouth daily.    . finasteride  (PROSCAR) 5 MG tablet Take 5 mg by mouth daily.    . fish oil-omega-3 fatty acids 1000 MG capsule Take 2 g by mouth daily.    Marland Kitchen ibuprofen (ADVIL,MOTRIN) 200 MG tablet Take 400 mg by mouth every 6 (six) hours as needed for headache.    . lisinopril (PRINIVIL,ZESTRIL) 10 MG tablet Take 10 mg by mouth daily.   0  . metoprolol succinate (TOPROL-XL) 50 MG 24 hr tablet Take 1 tablet (50 mg total) by mouth daily. Take with or immediately following a meal. 30 tablet 0  . Multiple Vitamin (MULTIVITAMIN) tablet Take 1 tablet by mouth daily.    . multivitamin-lutein (OCUVITE-LUTEIN) CAPS capsule Take 1 capsule by mouth daily.    . simvastatin (ZOCOR) 20 MG tablet Take 20 mg by mouth daily.    . tamsulosin (FLOMAX) 0.4 MG CAPS capsule Take 0.4 mg by mouth daily after supper.      No current facility-administered medications for this visit.    Allergies:  Patient has no known allergies.   Social History: The patient  reports that he has never smoked. He has never used smokeless tobacco. He reports that he drinks alcohol. He reports that he does not use drugs.   ROS:  Please see the history of present illness. Otherwise, complete review of systems is positive for none.  All other systems are reviewed and negative.   Physical Exam: VS:  BP (!) 150/78   Pulse  68   Ht 6' 4"  (1.93 m)   Wt 283 lb (128.4 kg)   SpO2 98%   BMI 34.45 kg/m , BMI Body mass index is 34.45 kg/m.  Wt Readings from Last 3 Encounters:  03/25/17 283 lb (128.4 kg)  03/18/17 273 lb 9.6 oz (124.1 kg)  01/31/17 280 lb (127 kg)    General: Tall overweight male, appears comfortable at rest. HEENT: Conjunctiva and lids normal, oropharynx clear. Neck: Supple, no elevated JVP or carotid bruits, no thyromegaly. Lungs: Clear to auscultation, nonlabored breathing at rest. Cardiac: Regular rate and rhythm, no S3, 2/6 systolic murmur, no pericardial rub. Abdomen: Soft, nontender, bowel sounds present, no guarding or rebound. Extremities:  No pitting edema, distal pulses 2+. Skin: Warm and dry. Musculoskeletal: No kyphosis. Neuropsychiatric: Alert and oriented 3, affect appropriate.  ECG: I personally reviewed the tracing from 03/17/2017 which showed normal sinus rhythm with right bundle branch block.  Recent Labwork: 03/17/2017: BUN 14; Creatinine, Ser 1.04; Hemoglobin 13.8; Platelets 213; Potassium 4.1; Sodium 138 03/18/2017: Magnesium 2.2     Component Value Date/Time   CHOL 147 03/16/2017 1300   TRIG 146 03/16/2017 1300   HDL 42 03/16/2017 1300   CHOLHDL 3.5 03/16/2017 1300   VLDL 29 03/16/2017 1300   LDLCALC 76 03/16/2017 1300    Other Studies Reviewed Today:  Echocardiogram 10/19/2015: Study Conclusions  - Left ventricle: The cavity size was normal. Wall thickness was at   the upper limits of normal. Systolic function was normal. The   estimated ejection fraction was in the range of 55% to 60%.   Doppler parameters are consistent with abnormal left ventricular   relaxation (grade 1 diastolic dysfunction). - Aortic valve: Mildly calcified annulus. Trileaflet; mildly   calcified leaflets. - Aortic root: Mildly ectatic ascending aorta. - Mitral valve: Calcified annulus. There was trivial regurgitation. - Left atrium: The atrium was at the upper limits of normal in   size. - Right atrium: The atrium was at the upper limits of normal in   size. - Atrial septum: No defect or patent foramen ovale was identified. - Tricuspid valve: There was physiologic regurgitation. Peak RV-RA   gradient (S): 9 mm Hg. - Pulmonary arteries: Systolic pressure could not be accurately   estimated. - Pericardium, extracardiac: There was no pericardial effusion.  Impressions:  - Upper normal LV wall thickness with LVEF 55-60%. Grade 1   diastolic dysfunction with normal estimated LV filling pressure.   Upper normal left atrial chamber size. MAC with trivial mitral   regurgitation. Mildly ectatic ascending aorta. Upper normal  right   atrial chamber size.  Lexiscan Myoview 03/18/2017:  There was no ST segment deviation noted during stress.  No T wave inversion was noted during stress.  The study is normal.  This is a low risk study.  The left ventricular ejection fraction is normal (55-65%).   Normal pharmacologic nuclear stress test with no evidence for prior infarct or ischemia.   Assessment and Plan:  1. Paroxysmal atrial tachycardia, recent symptoms very consistent with prior events despite beta blocker therapy. Toprol-XL has been increased to 50 mg daily - resting heart rate in the 60s. He had a recent follow-up Lexiscan Myoview that was low risk and negative for ischemia, LVEF normal. Plan is to obtain a 7 day event recorder to see if arrhythmia is truly correlating with his symptoms, if so we will refer him back to Dr. Rayann Heman to discuss ablation versus antiarrhythmic therapy. Symptoms might settle down on  higher dose beta blocker, but I suspect that we will be limited in how high this can be titrated without causing symptomatic bradycardia.  2. Essential hypertension, home blood pressure checks show fairly good control overall at baseline.  3. Systolic murmur, echocardiogram from last year did demonstrate aortic valve sclerosis without stenosis.  Current medicines were reviewed with the patient today.   Orders Placed This Encounter  Procedures  . Cardiac event monitor    Disposition: Call with test results.  Signed, Satira Sark, MD, [facility] 03/25/2017 2:25 PM    [facility] [place], [place], [place] [address] Phone: [phone]; Fax: [phone]

## 2017-03-25 ENCOUNTER — Encounter: Payer: Self-pay | Admitting: Cardiology

## 2017-03-25 ENCOUNTER — Ambulatory Visit (INDEPENDENT_AMBULATORY_CARE_PROVIDER_SITE_OTHER): Payer: Medicare Other | Admitting: Cardiology

## 2017-03-25 VITALS — BP 150/78 | HR 68 | Ht 76.0 in | Wt 283.0 lb

## 2017-03-25 DIAGNOSIS — I10 Essential (primary) hypertension: Secondary | ICD-10-CM

## 2017-03-25 DIAGNOSIS — I358 Other nonrheumatic aortic valve disorders: Secondary | ICD-10-CM

## 2017-03-25 DIAGNOSIS — I471 Supraventricular tachycardia: Secondary | ICD-10-CM

## 2017-03-25 NOTE — Patient Instructions (Signed)
Medication Instructions:  Your physician recommends that you continue on your current medications as directed. Please refer to the Current Medication list given to you today.  Labwork: NONE  Testing/Procedures: Your physician has recommended that you wear an event monitor FOR 7 DAYS. Event monitors are medical devices that record the heart's electrical activity. Doctors most often us these monitors to diagnose arrhythmias. Arrhythmias are problems with the speed or rhythm of the heartbeat. The monitor is a small, portable device. You can wear one while you do your normal daily activities. This is usually used to diagnose what is causing palpitations/syncope (passing out).  Follow-Up: Your physician recommends that you schedule a follow-up appointment PENDING TEST RESULTS  Any Other Special Instructions Will Be Listed Below (If Applicable).  If you need a refill on your cardiac medications before your next appointment, please call your pharmacy. 

## 2017-03-31 ENCOUNTER — Encounter (INDEPENDENT_AMBULATORY_CARE_PROVIDER_SITE_OTHER): Payer: Medicare Other

## 2017-03-31 ENCOUNTER — Ambulatory Visit: Payer: Medicare Other

## 2017-03-31 DIAGNOSIS — I471 Supraventricular tachycardia: Secondary | ICD-10-CM

## 2017-04-01 ENCOUNTER — Telehealth: Payer: Self-pay | Admitting: Cardiology

## 2017-04-01 NOTE — Telephone Encounter (Signed)
Mr. Hallum called stating that he is getting messages on My Chart about appointments to come in and see the Nurse plus to have a monitor placed.  Please call [phone].

## 2017-04-01 NOTE — Telephone Encounter (Signed)
Advised patient he had no upcoming appointments. Was receiving notification due to baseline being received from preventice and needing to add to monitor schedule. Patient verbalized understanding.

## 2017-04-10 ENCOUNTER — Telehealth: Payer: Self-pay

## 2017-04-10 NOTE — Telephone Encounter (Signed)
-----   Message from Acquanetta Chain, LPN sent at 03/09/9395  9:06 AM EDT -----   ----- Message ----- From: Satira Sark, MD Sent: 04/09/2017   6:03 PM To: Thompson Grayer, MD, Merlene Laughter, LPN  Results reviewed. Please see my recent office note. We need to schedule follow-up visit soon with Dr. Rayann Heman. Monitor showed some bursts of SVT versus atrial tachycardia (known diagnosis), but also episodes of heart block that could be contributing to symptoms as well. Would not further increase beta blocker at this point. A copy of this test should be forwarded to Manon Hilding, MD.

## 2017-04-10 NOTE — Telephone Encounter (Signed)
Patient notified. Appointment scheduled with Dr. Rayann Heman on 8/10. Routed to PCP

## 2017-04-11 ENCOUNTER — Encounter: Payer: Self-pay | Admitting: *Deleted

## 2017-04-11 ENCOUNTER — Telehealth: Payer: Self-pay | Admitting: Internal Medicine

## 2017-04-11 ENCOUNTER — Encounter: Payer: Self-pay | Admitting: Internal Medicine

## 2017-04-11 ENCOUNTER — Ambulatory Visit (INDEPENDENT_AMBULATORY_CARE_PROVIDER_SITE_OTHER): Payer: Medicare Other | Admitting: Internal Medicine

## 2017-04-11 VITALS — BP 144/87 | HR 59 | Ht 76.0 in | Wt 283.0 lb

## 2017-04-11 DIAGNOSIS — I471 Supraventricular tachycardia: Secondary | ICD-10-CM

## 2017-04-11 DIAGNOSIS — Z01812 Encounter for preprocedural laboratory examination: Secondary | ICD-10-CM | POA: Diagnosis not present

## 2017-04-11 DIAGNOSIS — I4430 Unspecified atrioventricular block: Secondary | ICD-10-CM

## 2017-04-11 DIAGNOSIS — I441 Atrioventricular block, second degree: Secondary | ICD-10-CM

## 2017-04-11 DIAGNOSIS — I4510 Unspecified right bundle-branch block: Secondary | ICD-10-CM | POA: Diagnosis not present

## 2017-04-11 MED ORDER — METOPROLOL SUCCINATE ER 25 MG PO TB24: 25.0000 mg | ORAL_TABLET | Freq: Every day | ORAL | 3 refills | Status: DC

## 2017-04-11 NOTE — Patient Instructions (Addendum)
Medication Instructions:   Decrease Toprol XL to 25mg  daily.   Continue all other current medications.  Labwork:  BMET, CBC - orders given today.  Office will contact with results via phone or letter.    Testing/Procedures:  Your physician has recommended that you have a pacemaker inserted. A pacemaker is a small device that is placed under the skin of your chest or abdomen to help control abnormal heart rhythms. This device uses electrical pulses to prompt the heart to beat at a normal rate. Pacemakers are used to treat heart rhythms that are too slow. Wire (leads) are attached to the pacemaker that goes into the chambers of you heart. This is done in the hospital and usually requires and overnight stay. Please see the instruction sheet given to you today for more information.  Your physician has requested that you have an echocardiogram. Echocardiography is a painless test that uses sound waves to create images of your heart. It provides your doctor with information about the size and shape of your heart and how well your heart's chambers and valves are working. This procedure takes approximately one hour. There are no restrictions for this procedure.  Office will contact with results via phone or letter.    Follow-Up: To be given at time of discharge after pacemaker.    Any Other Special Instructions Will Be Listed Below (If Applicable).  If you need a refill on your cardiac medications before your next appointment, please call your pharmacy.

## 2017-04-11 NOTE — Progress Notes (Signed)
 Electrophysiology Office Note   Date:  04/11/2017   ID:  Jonathan Lamb, DOB 07/20/1946, MRN [id number]  PCP:  Sasser, Paul W, MD  Cardiologist:  Dr McDowell Primary Electrophysiologist: Kaylianna Detert, MD    CC: dizziness, presyncope   History of Present Illness: Jonathan Lamb is a 71 y.o. male who presents today for electrophysiology evaluation.   He presents after recent event monitor reveals mobitz II second degree AV block.  He was seen by me previously in 2017 for ectopic atrial tachycardia.  Though he has done reasonably well with metoprolol, he continues to have occasional episodes with tachypalpitations and SOB.  He recently presented to  for further evaluation of presyncope and chest discomfort.  He had myoview which was unrevealing.  Subsequently, he had an event monitor placed.  This has documented mobitz II second degree AV block as well as occasional 2:1 AV block which correlates to his symptoms of presyncope.  He states that these symptoms only occur when walking briskly.  He feels that if he were to push himself further that he may pass out.  He has had no syncope or symptoms at rest.  Today, he denies symptoms of palpitations, chest pain, shortness of breath, orthopnea, PND, lower extremity edema, claudication,  bleeding, or neurologic sequela. The patient is tolerating medications without difficulties and is otherwise without complaint today.    Past Medical History:  Diagnosis Date  . Arthritis   . Benign prostatic hyperplasia   . Essential hypertension   . Hemorrhoid   . Hypercholesterolemia   . RBBB   . SVT (supraventricular tachycardia) (HCC)    Atrial tachycardia   Past Surgical History:  Procedure Laterality Date  . COLONOSCOPY N/A 03/24/2013   Procedure: COLONOSCOPY;  Surgeon: [name], MD;  Location[facility];  Service: Endoscopy;  Laterality: N/A;  830  . LAMINECTOMY     Lumbar  . PROSTATE BIOPSY    . THYROID SURGERY  1999   L  lobectomy, complicated by esophageal laceration requiring esophageal repair      Current Outpatient Prescriptions  Medication Sig Dispense Refill  . aspirin 81 MG tablet Take 81 mg by mouth daily.    . finasteride (PROSCAR) 5 MG tablet Take 5 mg by mouth daily.    . fish oil-omega-3 fatty acids 1000 MG capsule Take 2 g by mouth daily.    . ibuprofen (ADVIL,MOTRIN) 200 MG tablet Take 400 mg by mouth every 6 (six) hours as needed for headache.    . lisinopril (PRINIVIL,ZESTRIL) 10 MG tablet Take 10 mg by mouth daily.   0  . metoprolol succinate (TOPROL-XL) 25 MG 24 hr tablet Take 1 tablet (25 mg total) by mouth daily. 90 tablet 3  . Multiple Vitamin (MULTIVITAMIN) tablet Take 1 tablet by mouth daily.    . multivitamin-lutein (OCUVITE-LUTEIN) CAPS capsule Take 1 capsule by mouth daily.    . simvastatin (ZOCOR) 20 MG tablet Take 20 mg by mouth daily.    . tamsulosin (FLOMAX) 0.4 MG CAPS capsule Take 0.4 mg by mouth daily after supper.      No current facility-administered medications for this visit.     Allergies:   Patient has no known allergies.   Social History:  The patient  reports that he has never smoked. He has never used smokeless tobacco. He reports that he drinks alcohol. He reports that he does not use drugs.   Family History:  The patient's  family history includes   Arthritis in his brother; Deep vein thrombosis in his mother; Heart attack (age of onset: 56) in his maternal grandfather; Heart attack (age of onset: 74) in his father; Hypertension in his father and mother; Pancreatic cancer in his paternal grandfather; Pelvic inflammatory disease in his father; Stroke in his mother.    ROS:  Please see the history of present illness.   All other systems are personally reviewed and negative.    PHYSICAL EXAM: VS:  BP (!) 144/87   Pulse (!) 59   Ht 6' 4" (1.93 m)   Wt 283 lb (128.4 kg)   SpO2 97%   BMI 34.45 kg/m  , BMI Body mass index is 34.45 kg/m. GEN: Well nourished,  well developed, in no acute distress  HEENT: normal  Neck: no JVD, carotid bruits, or masses Cardiac: RRR; 2/6 SEM LUSB which is early peaking Respiratory:  clear to auscultation bilaterally, normal work of breathing GI: soft, nontender, nondistended, + BS MS: no deformity or atrophy  Skin: warm and dry  Neuro:  Strength and sensation are intact Psych: euthymic mood, full affect  Recent Labs: 03/17/2017: BUN 14; Creatinine, Ser 1.04; Hemoglobin 13.8; Platelets 213; Potassium 4.1; Sodium 138 03/18/2017: Magnesium 2.2  personally reviewed   Lipid Panel     Component Value Date/Time   CHOL 147 03/16/2017 1300   TRIG 146 03/16/2017 1300   HDL 42 03/16/2017 1300   CHOLHDL 3.5 03/16/2017 1300   VLDL 29 03/16/2017 1300   LDLCALC 76 03/16/2017 1300   personally reviewed   Wt Readings from Last 3 Encounters:  04/11/17 283 lb (128.4 kg)  03/25/17 283 lb (128.4 kg)  03/18/17 273 lb 9.6 oz (124.1 kg)      Other studies personally reviewed: Additional studies/ records that were reviewed today include: event monitor is reviewed as above,  Prior ekgs reveal sinus rhythm with RBBB, Dr McDowells notes, recent myoview, echo from 2017 Review of the above records today demonstrates: as above   ASSESSMENT AND PLAN:  1.  Mobitz II second degree AV block The patient has documented AV block as the cause for his symptoms.  This occurs in the setting of longstanding conduction system disease with RBBB on ekgs for several years.  Though he is on low dose beta blocker therapy, he requires this long term for management of ectopic atrial tachycardia.  I would therefore recommend pacemaker implantation at this time.  Risks, benefits, alternatives to pacemaker implantation were discussed in detail with the patient today. The patient understands that the risks include but are not limited to bleeding, infection, pneumothorax, perforation, tamponade, vascular damage, renal failure, MI, stroke, death,  and lead  dislodgement and wishes to proceed.  He is going on vacation to the beach in 2 weeks and wishes to have this performed upon return. I have cautioned against driving.  He is also to present by EMS to the ED immediately for syncope or symptoms at rest in the interm. Echo to evaluate for structural heart changes prior to PPM implant.  2. Ectopic atrial tachycardia Reduce toprol to 25mg daily given AV block Will have more options for management after PPM implant  3. HTN Stable No change required today    Current medicines are reviewed at length with the patient today.   The patient does not have concerns regarding his medicines.  The following changes were made today:  none  Labs/ tests ordered today include:  Orders Placed This Encounter  Procedures  . Basic metabolic panel  .   CBC  . ECHOCARDIOGRAM COMPLETE     Signed, Tamia Dial, MD  04/11/2017 2:21 PM     [facility] [address] [place] [address] [phone] (office) [phone] (fax) 

## 2017-04-11 NOTE — Telephone Encounter (Signed)
Pre-cert Verification for the following procedure   Echo scheduled for 04-14-17  At [facility].

## 2017-04-14 ENCOUNTER — Other Ambulatory Visit (HOSPITAL_COMMUNITY): Payer: Medicare Other

## 2017-04-14 ENCOUNTER — Ambulatory Visit (HOSPITAL_COMMUNITY)
Admission: RE | Admit: 2017-04-14 | Discharge: 2017-04-14 | Disposition: A | Discharge status: Home / Self Care | Payer: Medicare Other | Source: Ambulatory Visit | Attending: Internal Medicine | Admitting: Internal Medicine

## 2017-04-14 ENCOUNTER — Other Ambulatory Visit (HOSPITAL_COMMUNITY)
Admission: RE | Admit: 2017-04-14 | Discharge: 2017-04-14 | Disposition: A | Discharge status: Home / Self Care | Payer: Medicare Other | Source: Ambulatory Visit | Attending: Internal Medicine | Admitting: Internal Medicine

## 2017-04-14 DIAGNOSIS — E785 Hyperlipidemia, unspecified: Secondary | ICD-10-CM | POA: Insufficient documentation

## 2017-04-14 DIAGNOSIS — Z01812 Encounter for preprocedural laboratory examination: Secondary | ICD-10-CM | POA: Diagnosis present

## 2017-04-14 DIAGNOSIS — I119 Hypertensive heart disease without heart failure: Secondary | ICD-10-CM | POA: Diagnosis not present

## 2017-04-14 DIAGNOSIS — I4430 Unspecified atrioventricular block: Secondary | ICD-10-CM | POA: Diagnosis not present

## 2017-04-14 DIAGNOSIS — I348 Other nonrheumatic mitral valve disorders: Secondary | ICD-10-CM | POA: Diagnosis not present

## 2017-04-14 DIAGNOSIS — I471 Supraventricular tachycardia: Secondary | ICD-10-CM | POA: Diagnosis not present

## 2017-04-14 DIAGNOSIS — I4510 Unspecified right bundle-branch block: Secondary | ICD-10-CM | POA: Diagnosis not present

## 2017-04-14 LAB — CBC
HCT: 40.2 % (ref 39.0–52.0)
HEMOGLOBIN: 13.5 g/dL (ref 13.0–17.0)
MCH: 30 pg (ref 26.0–34.0)
MCHC: 33.6 g/dL (ref 30.0–36.0)
MCV: 89.3 fL (ref 78.0–100.0)
PLATELETS: 207 10*3/uL (ref 150–400)
RBC: 4.5 MIL/uL (ref 4.22–5.81)
RDW: 13.6 % (ref 11.5–15.5)
WBC: 5.9 10*3/uL (ref 4.0–10.5)

## 2017-04-14 LAB — BASIC METABOLIC PANEL
ANION GAP: 7 (ref 5–15)
BUN: 16 mg/dL (ref 6–20)
CALCIUM: 9.1 mg/dL (ref 8.9–10.3)
CO2: 27 mmol/L (ref 22–32)
Chloride: 105 mmol/L (ref 101–111)
Creatinine, Ser: 1.01 mg/dL (ref 0.61–1.24)
GLUCOSE: 101 mg/dL — AB (ref 65–99)
POTASSIUM: 4.3 mmol/L (ref 3.5–5.1)
SODIUM: 139 mmol/L (ref 135–145)

## 2017-04-14 NOTE — Progress Notes (Signed)
*  PRELIMINARY RESULTS* Echocardiogram 2D Echocardiogram has been performed.  Jonathan Lamb 04/14/2017, 2:04 PM

## 2017-04-15 ENCOUNTER — Ambulatory Visit: Payer: Medicare Other | Admitting: Cardiology

## 2017-04-18 ENCOUNTER — Telehealth: Payer: Self-pay | Admitting: Cardiology

## 2017-04-18 NOTE — Telephone Encounter (Signed)
Wife called to get clarification on patients instructions and what to do if he has problems while on vacation. Per wife, patient continues to be dizzy when walking up steps but its not any worse than it was prior to visit. Wife said that it seems to be happening more frequently. Wife says that patient has not passed out and does not have symptoms at rest. Wife advised that per Dr. Jackalyn Lombard last office note that if patient develops worsening symptoms at rest or passes out that he needed to be transported to the ED by EMS and wife advised that if this happens while on vacation, that she should call 911 and have patient transported to the nearest hospital by EMS. Wife said that she was very nervous about taking him on vacation but that she felt reassured after speaking to nurse and having instructions reiterated again to her.No c/o sob or chest pain. Wife said that patient's BP has been normal. Wife verbalized understanding of plan.

## 2017-04-18 NOTE — Telephone Encounter (Signed)
New message     Pt c/o Shortness Of Breath: STAT if SOB developed within the last 24 hours or pt is noticeably SOB on the phone  1. Are you currently SOB (can you hear that pt is SOB on the phone)?  Wife on phone they are getting ready to go on vacation.   2. How long have you been experiencing SOB? 1 week    3. Are you SOB when sitting or when up moving around?  When moving around -seems to be getting worse  4. Are you currently experiencing any other symptoms?  lighthead dizzy

## 2017-04-25 ENCOUNTER — Ambulatory Visit (HOSPITAL_COMMUNITY)
Admission: RE | Admit: 2017-04-25 | Discharge: 2017-04-26 | Disposition: A | Discharge status: Home / Self Care | Payer: Medicare Other | Source: Ambulatory Visit | Attending: Internal Medicine | Admitting: Internal Medicine

## 2017-04-25 ENCOUNTER — Encounter (HOSPITAL_COMMUNITY)
Admission: RE | Disposition: A | Discharge status: Home / Self Care | Payer: Self-pay | Source: Ambulatory Visit | Attending: Internal Medicine

## 2017-04-25 DIAGNOSIS — I4510 Unspecified right bundle-branch block: Secondary | ICD-10-CM | POA: Insufficient documentation

## 2017-04-25 DIAGNOSIS — I10 Essential (primary) hypertension: Secondary | ICD-10-CM | POA: Insufficient documentation

## 2017-04-25 DIAGNOSIS — N400 Enlarged prostate without lower urinary tract symptoms: Secondary | ICD-10-CM | POA: Diagnosis not present

## 2017-04-25 DIAGNOSIS — Z7982 Long term (current) use of aspirin: Secondary | ICD-10-CM | POA: Diagnosis not present

## 2017-04-25 DIAGNOSIS — I471 Supraventricular tachycardia: Secondary | ICD-10-CM | POA: Diagnosis not present

## 2017-04-25 DIAGNOSIS — M1990 Unspecified osteoarthritis, unspecified site: Secondary | ICD-10-CM | POA: Insufficient documentation

## 2017-04-25 DIAGNOSIS — Z950 Presence of cardiac pacemaker: Secondary | ICD-10-CM

## 2017-04-25 DIAGNOSIS — I441 Atrioventricular block, second degree: Secondary | ICD-10-CM | POA: Diagnosis present

## 2017-04-25 DIAGNOSIS — Z959 Presence of cardiac and vascular implant and graft, unspecified: Secondary | ICD-10-CM

## 2017-04-25 DIAGNOSIS — E7800 Pure hypercholesterolemia, unspecified: Secondary | ICD-10-CM | POA: Insufficient documentation

## 2017-04-25 HISTORY — PX: PACEMAKER IMPLANT: EP1218

## 2017-04-25 LAB — SURGICAL PCR SCREEN
MRSA, PCR: NEGATIVE
Staphylococcus aureus: NEGATIVE

## 2017-04-25 SURGERY — PACEMAKER IMPLANT

## 2017-04-25 MED ORDER — SODIUM CHLORIDE 0.9 % IV SOLN
INTRAVENOUS | Status: DC
  Administered 2017-04-25: 07:00:00 via INTRAVENOUS

## 2017-04-25 MED ORDER — YOU HAVE A PACEMAKER BOOK
Freq: Once | Status: DC
  Filled 2017-04-25: qty 1

## 2017-04-25 MED ORDER — HYDROCODONE-ACETAMINOPHEN 5-325 MG PO TABS
1.0000 | ORAL_TABLET | ORAL | Status: DC | PRN
  Administered 2017-04-25 – 2017-04-26 (×3): 1 via ORAL
  Filled 2017-04-25 (×2): qty 1
  Filled 2017-04-25: qty 2

## 2017-04-25 MED ORDER — SODIUM CHLORIDE 0.9% FLUSH
3.0000 mL | INTRAVENOUS | Status: DC | PRN
  Administered 2017-04-25: 22:00:00 3 mL via INTRAVENOUS
  Filled 2017-04-25: qty 3

## 2017-04-25 MED ORDER — CHLORHEXIDINE GLUCONATE 4 % EX LIQD
60.0000 mL | Freq: Once | CUTANEOUS | Status: DC
  Filled 2017-04-25: qty 60

## 2017-04-25 MED ORDER — ACETAMINOPHEN 325 MG PO TABS
325.0000 mg | ORAL_TABLET | Freq: Four times a day (QID) | ORAL | Status: DC | PRN
  Administered 2017-04-25: 325 mg via ORAL
  Filled 2017-04-25: qty 1

## 2017-04-25 MED ORDER — CEFAZOLIN SODIUM-DEXTROSE 1-4 GM/50ML-% IV SOLN
1.0000 g | Freq: Four times a day (QID) | INTRAVENOUS | Status: AC
  Administered 2017-04-25 – 2017-04-26 (×3): 1 g via INTRAVENOUS
  Filled 2017-04-25 (×3): qty 50

## 2017-04-25 MED ORDER — MUPIROCIN 2 % EX OINT
TOPICAL_OINTMENT | Freq: Once | CUTANEOUS | Status: AC
  Administered 2017-04-25: 1 via NASAL
  Filled 2017-04-25: qty 22

## 2017-04-25 MED ORDER — SODIUM CHLORIDE 0.9 % IV SOLN: 250.0000 mL | INTRAVENOUS | Status: DC | PRN

## 2017-04-25 MED ORDER — MIDAZOLAM HCL 5 MG/5ML IJ SOLN
INTRAMUSCULAR | Status: AC
  Filled 2017-04-25: qty 5

## 2017-04-25 MED ORDER — LIDOCAINE HCL (PF) 1 % IJ SOLN
INTRAMUSCULAR | Status: AC
  Filled 2017-04-25: qty 60

## 2017-04-25 MED ORDER — METOPROLOL SUCCINATE ER 25 MG PO TB24
25.0000 mg | ORAL_TABLET | Freq: Every day | ORAL | Status: DC
  Administered 2017-04-25: 25 mg via ORAL
  Filled 2017-04-25: qty 1

## 2017-04-25 MED ORDER — CEFAZOLIN SODIUM-DEXTROSE 2-3 GM-% IV SOLR
INTRAVENOUS | Status: AC | PRN
  Administered 2017-04-25: 2 g via INTRAVENOUS

## 2017-04-25 MED ORDER — FENTANYL CITRATE (PF) 100 MCG/2ML IJ SOLN
INTRAMUSCULAR | Status: AC
  Filled 2017-04-25: qty 2

## 2017-04-25 MED ORDER — HEPARIN (PORCINE) IN NACL 2-0.9 UNIT/ML-% IJ SOLN
INTRAMUSCULAR | Status: AC
  Filled 2017-04-25: qty 500

## 2017-04-25 MED ORDER — SODIUM CHLORIDE 0.9 % IR SOLN
Status: DC | PRN
  Administered 2017-04-25: 09:00:00

## 2017-04-25 MED ORDER — TAMSULOSIN HCL 0.4 MG PO CAPS
0.4000 mg | ORAL_CAPSULE | Freq: Every evening | ORAL | Status: DC
  Administered 2017-04-25: 19:00:00 0.4 mg via ORAL
  Filled 2017-04-25: qty 1

## 2017-04-25 MED ORDER — ONDANSETRON HCL 4 MG/2ML IJ SOLN: 4.0000 mg | Freq: Four times a day (QID) | INTRAMUSCULAR | Status: DC | PRN

## 2017-04-25 MED ORDER — ACETAMINOPHEN 325 MG PO TABS
325.0000 mg | ORAL_TABLET | ORAL | Status: DC | PRN
Start: 2017-04-25 — End: 2017-04-25
  Administered 2017-04-25: 650 mg via ORAL

## 2017-04-25 MED ORDER — CEFAZOLIN SODIUM-DEXTROSE 2-4 GM/100ML-% IV SOLN
2.0000 g | INTRAVENOUS | Status: DC
  Filled 2017-04-25: qty 100

## 2017-04-25 MED ORDER — FINASTERIDE 5 MG PO TABS
5.0000 mg | ORAL_TABLET | Freq: Every evening | ORAL | Status: DC
  Administered 2017-04-25: 19:00:00 5 mg via ORAL
  Filled 2017-04-25: qty 1

## 2017-04-25 MED ORDER — MIDAZOLAM HCL 5 MG/5ML IJ SOLN
INTRAMUSCULAR | Status: DC | PRN
  Administered 2017-04-25 (×3): 1 mg via INTRAVENOUS

## 2017-04-25 MED ORDER — ACETAMINOPHEN 325 MG PO TABS
ORAL_TABLET | ORAL | Status: AC
  Filled 2017-04-25: qty 2

## 2017-04-25 MED ORDER — MUPIROCIN 2 % EX OINT
TOPICAL_OINTMENT | CUTANEOUS | Status: AC
  Administered 2017-04-25: 1 via NASAL
  Filled 2017-04-25: qty 22

## 2017-04-25 MED ORDER — SODIUM CHLORIDE 0.9 % IR SOLN
80.0000 mg | Status: DC
  Filled 2017-04-25: qty 2

## 2017-04-25 MED ORDER — HYDRALAZINE HCL 20 MG/ML IJ SOLN
10.0000 mg | Freq: Once | INTRAMUSCULAR | Status: AC
  Administered 2017-04-25: 10 mg via INTRAVENOUS
  Filled 2017-04-25: qty 1

## 2017-04-25 MED ORDER — CEFAZOLIN SODIUM-DEXTROSE 2-4 GM/100ML-% IV SOLN
INTRAVENOUS | Status: AC
  Filled 2017-04-25: qty 100

## 2017-04-25 MED ORDER — HEPARIN (PORCINE) IN NACL 2-0.9 UNIT/ML-% IJ SOLN
INTRAMUSCULAR | Status: AC | PRN
  Administered 2017-04-25: 500 mL

## 2017-04-25 MED ORDER — FENTANYL CITRATE (PF) 100 MCG/2ML IJ SOLN
INTRAMUSCULAR | Status: DC | PRN
  Administered 2017-04-25 (×3): 12.5 ug via INTRAVENOUS

## 2017-04-25 MED ORDER — SODIUM CHLORIDE 0.9% FLUSH: 3.0000 mL | Freq: Two times a day (BID) | INTRAVENOUS | Status: DC

## 2017-04-25 MED ORDER — LISINOPRIL 10 MG PO TABS
10.0000 mg | ORAL_TABLET | Freq: Every evening | ORAL | Status: DC
  Administered 2017-04-25: 10 mg via ORAL
  Filled 2017-04-25: qty 1

## 2017-04-25 MED ORDER — LIDOCAINE HCL (PF) 1 % IJ SOLN
INTRAMUSCULAR | Status: DC | PRN
  Administered 2017-04-25: 40 mL via INTRADERMAL

## 2017-04-25 SURGICAL SUPPLY — 9 items
CABLE SURGICAL S-101-97-12 (CABLE) ×2 IMPLANT
IPG PACE AZUR XT DR MRI W1DR01 (Pacemaker) IMPLANT
LEAD CAPSURE NOVUS 5076-52CM (Lead) ×2 IMPLANT
LEAD CAPSURE NOVUS 5076-58CM (Lead) ×2 IMPLANT
PACE AZURE XT DR MRI W1DR01 (Pacemaker) ×3 IMPLANT
PAD DEFIB LIFELINK (PAD) ×2 IMPLANT
SHEATH CLASSIC 7F (SHEATH) ×4 IMPLANT
SHEATH PINNACLE 6F 10CM (SHEATH) ×2 IMPLANT
TRAY PACEMAKER INSERTION (PACKS) ×2 IMPLANT

## 2017-04-25 NOTE — Progress Notes (Signed)
Patient's blood pressure remains elevated several hours after usual home antihypertensives given in cath lab. Notified Dr. Lovena Le and received orders for IV hydralazine.

## 2017-04-25 NOTE — H&P (View-Only) (Signed)
Electrophysiology Office Note   Date:  04/11/2017   ID:  Jonathan Lamb, DOB [birth year], MRN [id number]  PCP:  Manon Hilding, MD  Cardiologist:  Dr Domenic Polite Primary Electrophysiologist: Thompson Grayer, MD    CC: dizziness, presyncope   History of Present Illness: Jonathan Lamb is a 71 y.o. male who presents today for electrophysiology evaluation.   He presents after recent event monitor reveals mobitz II second degree AV block.  He was seen by me previously in 2017 for ectopic atrial tachycardia.  Though he has done reasonably well with metoprolol, he continues to have occasional episodes with tachypalpitations and SOB.  He recently presented to [facility] for further evaluation of presyncope and chest discomfort.  He had myoview which was unrevealing.  Subsequently, he had an event monitor placed.  This has documented mobitz II second degree AV block as well as occasional 2:1 AV block which correlates to his symptoms of presyncope.  He states that these symptoms only occur when walking briskly.  He feels that if he were to push himself further that he may pass out.  He has had no syncope or symptoms at rest.  Today, he denies symptoms of palpitations, chest pain, shortness of breath, orthopnea, PND, lower extremity edema, claudication,  bleeding, or neurologic sequela. The patient is tolerating medications without difficulties and is otherwise without complaint today.    Past Medical History:  Diagnosis Date  . Arthritis   . Benign prostatic hyperplasia   . Essential hypertension   . Hemorrhoid   . Hypercholesterolemia   . RBBB   . SVT (supraventricular tachycardia) (HCC)    Atrial tachycardia   Past Surgical History:  Procedure Laterality Date  . COLONOSCOPY N/A 03/24/2013   Procedure: COLONOSCOPY;  Surgeon: Rogene Houston, MD;  Location: [facility];  Service: Endoscopy;  Laterality: N/A;  830  . LAMINECTOMY     Lumbar  . PROSTATE BIOPSY    . THYROID SURGERY  1999   L  lobectomy, complicated by esophageal laceration requiring esophageal repair      Current Outpatient Prescriptions  Medication Sig Dispense Refill  . aspirin 81 MG tablet Take 81 mg by mouth daily.    . finasteride (PROSCAR) 5 MG tablet Take 5 mg by mouth daily.    . fish oil-omega-3 fatty acids 1000 MG capsule Take 2 g by mouth daily.    Marland Kitchen ibuprofen (ADVIL,MOTRIN) 200 MG tablet Take 400 mg by mouth every 6 (six) hours as needed for headache.    . lisinopril (PRINIVIL,ZESTRIL) 10 MG tablet Take 10 mg by mouth daily.   0  . metoprolol succinate (TOPROL-XL) 25 MG 24 hr tablet Take 1 tablet (25 mg total) by mouth daily. 90 tablet 3  . Multiple Vitamin (MULTIVITAMIN) tablet Take 1 tablet by mouth daily.    . multivitamin-lutein (OCUVITE-LUTEIN) CAPS capsule Take 1 capsule by mouth daily.    . simvastatin (ZOCOR) 20 MG tablet Take 20 mg by mouth daily.    . tamsulosin (FLOMAX) 0.4 MG CAPS capsule Take 0.4 mg by mouth daily after supper.      No current facility-administered medications for this visit.     Allergies:   Patient has no known allergies.   Social History:  The patient  reports that he has never smoked. He has never used smokeless tobacco. He reports that he drinks alcohol. He reports that he does not use drugs.   Family History:  The patient's  family history includes  Arthritis in his brother; Deep vein thrombosis in his mother; Heart attack (age of onset: 50) in his maternal grandfather; Heart attack (age of onset: 60) in his father; Hypertension in his father and mother; Pancreatic cancer in his paternal grandfather; Pelvic inflammatory disease in his father; Stroke in his mother.    ROS:  Please see the history of present illness.   All other systems are personally reviewed and negative.    PHYSICAL EXAM: VS:  BP (!) 144/87   Pulse (!) 59   Ht 6\' 4"  (1.93 m)   Wt 283 lb (128.4 kg)   SpO2 97%   BMI 34.45 kg/m  , BMI Body mass index is 34.45 kg/m. GEN: Well nourished,  well developed, in no acute distress  HEENT: normal  Neck: no JVD, carotid bruits, or masses Cardiac: RRR; 2/6 SEM LUSB which is early peaking Respiratory:  clear to auscultation bilaterally, normal work of breathing GI: soft, nontender, nondistended, + BS MS: no deformity or atrophy  Skin: warm and dry  Neuro:  Strength and sensation are intact Psych: euthymic mood, full affect  Recent Labs: 03/17/2017: BUN 14; Creatinine, Ser 1.04; Hemoglobin 13.8; Platelets 213; Potassium 4.1; Sodium 138 03/18/2017: Magnesium 2.2  personally reviewed   Lipid Panel     Component Value Date/Time   CHOL 147 03/16/2017 1300   TRIG 146 03/16/2017 1300   HDL 42 03/16/2017 1300   CHOLHDL 3.5 03/16/2017 1300   VLDL 29 03/16/2017 1300   LDLCALC 76 03/16/2017 1300   personally reviewed   Wt Readings from Last 3 Encounters:  04/11/17 283 lb (128.4 kg)  03/25/17 283 lb (128.4 kg)  03/18/17 273 lb 9.6 oz (124.1 kg)      Other studies personally reviewed: Additional studies/ records that were reviewed today include: event monitor is reviewed as above,  Prior ekgs reveal sinus rhythm with RBBB, Dr Chuck Hint notes, recent myoview, echo from 2017 Review of the above records today demonstrates: as above   ASSESSMENT AND PLAN:  1.  Mobitz II second degree AV block The patient has documented AV block as the cause for his symptoms.  This occurs in the setting of longstanding conduction system disease with RBBB on ekgs for several years.  Though he is on low dose beta blocker therapy, he requires this long term for management of ectopic atrial tachycardia.  I would therefore recommend pacemaker implantation at this time.  Risks, benefits, alternatives to pacemaker implantation were discussed in detail with the patient today. The patient understands that the risks include but are not limited to bleeding, infection, pneumothorax, perforation, tamponade, vascular damage, renal failure, MI, stroke, death,  and lead  dislodgement and wishes to proceed.  He is going on vacation to the beach in 2 weeks and wishes to have this performed upon return. I have cautioned against driving.  He is also to present by EMS to the ED immediately for syncope or symptoms at rest in the interm. Echo to evaluate for structural heart changes prior to PPM implant.  2. Ectopic atrial tachycardia Reduce toprol to 25mg  daily given AV block Will have more options for management after PPM implant  3. HTN Stable No change required today    Current medicines are reviewed at length with the patient today.   The patient does not have concerns regarding his medicines.  The following changes were made today:  none  Labs/ tests ordered today include:  Orders Placed This Encounter  Procedures  . Basic metabolic panel  .  CBC  . ECHOCARDIOGRAM COMPLETE     Signed, Thompson Grayer, MD  04/11/2017 2:21 PM     [address] [place] [address] [phone] (office) [phone] (fax)

## 2017-04-25 NOTE — Interval H&P Note (Signed)
History and Physical Interval Note:  04/25/2017 6:31 AM  Jonathan Lamb  has presented today for surgery, with the diagnosis of hb  The various methods of treatment have been discussed with the patient and family. After consideration of risks, benefits and other options for treatment, the patient has consented to  Procedure(s): Pacemaker Implant (N/A) as a surgical intervention .  The patient's history has been reviewed, patient examined, no change in status, stable for surgery.  I have reviewed the patient's chart and labs.  Questions were answered to the patient's satisfaction.     Thompson Grayer

## 2017-04-26 ENCOUNTER — Ambulatory Visit (HOSPITAL_COMMUNITY): Payer: Medicare Other

## 2017-04-26 DIAGNOSIS — I10 Essential (primary) hypertension: Secondary | ICD-10-CM | POA: Diagnosis not present

## 2017-04-26 DIAGNOSIS — Z959 Presence of cardiac and vascular implant and graft, unspecified: Secondary | ICD-10-CM

## 2017-04-26 DIAGNOSIS — M1990 Unspecified osteoarthritis, unspecified site: Secondary | ICD-10-CM | POA: Diagnosis not present

## 2017-04-26 DIAGNOSIS — I441 Atrioventricular block, second degree: Secondary | ICD-10-CM | POA: Diagnosis not present

## 2017-04-26 DIAGNOSIS — N400 Enlarged prostate without lower urinary tract symptoms: Secondary | ICD-10-CM | POA: Diagnosis not present

## 2017-04-26 DIAGNOSIS — Z950 Presence of cardiac pacemaker: Secondary | ICD-10-CM

## 2017-04-26 MED ORDER — TRAMADOL HCL 50 MG PO TABS: 25.0000 mg | ORAL_TABLET | Freq: Three times a day (TID) | ORAL | 0 refills | Status: AC | PRN

## 2017-04-26 MED ORDER — OFF THE BEAT BOOK
Freq: Once | Status: AC
  Administered 2017-04-26: 1
  Filled 2017-04-26: qty 1

## 2017-04-26 NOTE — Care Management Note (Signed)
Case Management Note  Patient Details  Name: Jonathan Lamb MRN: [id number] Date of Birth: Aug 30, 1946  Subjective/Objective:                 Patient with order to DC to home today. Chart reviewed. No Home Health or Equipment needs, no unacknowledged Case Management consults or medication needs identified at the time of this note. Plan for DC to home. If needs arise today prior to discharge, please call Carles Collet RN CM at [phone].    Action/Plan:   Expected Discharge Date:  04/26/17               Expected Discharge Plan:  Home/Self Care  In-House Referral:     Discharge planning Services  CM Consult  Post Acute Care Choice:    Choice offered to:     DME Arranged:    DME Agency:     HH Arranged:    HH Agency:     Status of Service:  Completed, signed off  If discussed at [employer] of Stay Meetings, dates discussed:    Additional Comments:  Carles Collet, RN 04/26/2017, 9:23 AM

## 2017-04-26 NOTE — Progress Notes (Signed)
Entered in error

## 2017-04-26 NOTE — Discharge Summary (Addendum)
ELECTROPHYSIOLOGY PROCEDURE DISCHARGE SUMMARY    Patient ID: Jonathan Lamb,  MRN: [id number], DOB/AGE: 1946/03/02 71 y.o.  Admit date: 04/25/2017 Discharge date: 04/26/2017  Primary Care Physician: Manon Hilding, MD Primary Cardiologist: Domenic Polite Electrophysiologist: Allred  Primary Discharge Diagnosis:  Symptomatic Mobitz II heart block status post pacemaker implantation this admission  Secondary Discharge Diagnosis:  1.  BPH 2.  HTN 3.  Hyperlipdiemia  No Known Allergies   Procedures This Admission:  1.  Implantation of a MDT dual chamber PPM on 04/25/17 by Dr Rayann Heman.  The patient received a MDT model number Azure PPM with model number 5076 right atrial lead and 5076 right ventricular lead. There were no immediate post procedure complications. 2.  CXR on 04/26/17 demonstrated no pneumothorax status post device implantation.   Brief HPI: Jonathan Lamb is a 71 y.o. male was referred to electrophysiology in the outpatient setting for consideration of PPM implantation. The patient has had symptomatic Mobitz II without reversible causes identified.  Risks, benefits, and alternatives to PPM implantation were reviewed with the patient who wished to proceed.   Hospital Course:  The patient was admitted and underwent implantation of a MDT dual chamber PPM with details as outlined above.  He  was monitored on telemetry overnight which demonstrated sinus with intermittent V pacing.  Left chest was without hematoma or ecchymosis. The device was interrogated and found to be functioning normally. CXR was obtained and demonstrated no pneumothorax status post device implantation. Wound care, arm mobility, and restrictions were reviewed with the patient.  The patient was examined and considered stable for discharge to home.    Physical Exam: Vitals:   04/26/17 0258 04/26/17 0400 04/26/17 0600 04/26/17 0717  BP: (!) 146/89 115/64 121/77 109/76  Pulse: 69 69 64 71  Resp: 17 13 13 16     Temp:  97.9 F (36.6 C)  97.6 F (36.4 C)  TempSrc:  Oral  Oral  SpO2: 95% 94% 94% 94%  Weight:      Height:        GEN- The patient is well appearing, alert and oriented x 3 today.   HEENT: normocephalic, atraumatic; sclera clear, conjunctiva pink; hearing intact; oropharynx clear; neck supple, no JVP Lymph- no cervical lymphadenopathy Lungs- Clear to ausculation bilaterally, normal work of breathing.  No wheezes, rales, rhonchi Heart- Regular rate and rhythm, no murmurs, rubs or gallops, PMI not laterally displaced GI- soft, non-tender, non-distended, bowel sounds present, no hepatosplenomegaly Extremities- no clubbing, cyanosis, or edema; DP/PT/radial pulses 2+ bilaterally MS- no significant deformity or atrophy Skin- warm and dry, no rash or lesion, left chest without hematoma/ecchymosis Psych- euthymic mood, full affect Neuro- strength and sensation are intact   Labs:   Lab Results  Component Value Date   WBC 5.9 04/14/2017   HGB 13.5 04/14/2017   HCT 40.2 04/14/2017   MCV 89.3 04/14/2017   PLT 207 04/14/2017   No results for input(s): NA, K, CL, CO2, BUN, CREATININE, CALCIUM, PROT, BILITOT, ALKPHOS, ALT, AST, GLUCOSE in the last 168 hours.  Invalid input(s): LABALBU  Discharge Medications:  Allergies as of 04/26/2017   No Known Allergies     Medication List    TAKE these medications   aspirin 81 MG tablet Take 81 mg by mouth every evening. Notes to patient:  Heart health (decreases clotting)   finasteride 5 MG tablet Commonly known as:  PROSCAR Take 5 mg by mouth every evening. Notes to patient:  Prostate  Fish Oil 1200 MG Caps Take 1,200 mg by mouth every evening. Notes to patient:  Decreases triglycerides    ibuprofen 200 MG tablet Commonly known as:  ADVIL,MOTRIN Take 400 mg by mouth daily as needed for headache. Notes to patient:  As needed for headache   lisinopril 10 MG tablet Commonly known as:  PRINIVIL,ZESTRIL Take 10 mg by mouth every  evening. Notes to patient:  Decreases blood pressure    metoprolol succinate 25 MG 24 hr tablet Commonly known as:  TOPROL-XL Take 1 tablet (25 mg total) by mouth daily. Notes to patient:  Decreases work of the heart  Decreases heart rate and blood pressure   multivitamin tablet Take 1 tablet by mouth every evening. Notes to patient:  Supplement    multivitamin-lutein Caps capsule Take 1 capsule by mouth every evening. Notes to patient:  Supplement with eye care added    simvastatin 20 MG tablet Commonly known as:  ZOCOR Take 20 mg by mouth every evening. Notes to patient:  Cholesterol    tamsulosin 0.4 MG Caps capsule Commonly known as:  FLOMAX Take 0.4 mg by mouth every evening. Notes to patient:  Prostate    traMADol 50 MG tablet Commonly known as:  ULTRAM Take 0.5 tablets (25 mg total) by mouth 3 (three) times daily as needed for moderate pain or severe pain.            Discharge Care Instructions        Start     Ordered   04/26/17 0000  traMADol (ULTRAM) 50 MG tablet  3 times daily PRN    Question:  Supervising Provider  Answer:  Sherren Mocha   04/26/17 0920      Disposition:   Follow-up Information    [facility] Follow up on 05/08/2017.   Specialty:  Cardiology Why:  at [facility] for wound check Contact information: [address] [place]       Thompson Grayer, MD Follow up on 08/08/2017.   Specialty:  Cardiology Why:  at 8:30AM Contact information: [place] [address] [phone]          Duration of Discharge Encounter: Greater than 30 minutes including physician time.  Signed, Angelena Form 04/26/2017 9:21 AM  The patient was seen, examined and discussed with Lorretta Harp, PA-C and I agree with the above.   Pacemaker implantation site no bleeding, erythema or oozing. The patient is still in pain with elevated BP< we will give Tramadol  25 mg po TID PRN, total of 5 pills. Follow up wound check is scheduled for 05/08/17. He had elevated BP with chest pain, now rather low, we will continue his home meds, he is advised to call us with elevated BP at home. Stable for discharge, PM care instruction given to the patient.  Ena Dawley, MD 04/26/2017

## 2017-04-26 NOTE — Discharge Instructions (Signed)
° ° °  Supplemental Discharge Instructions for  Pacemaker/Defibrillator Patients  Activity No heavy lifting or vigorous activity with your left/right arm for 6 to 8 weeks.  Do not raise your left/right arm above your head for one week.  Gradually raise your affected arm as drawn below.           __       04/29/17                  04/30/17                   05/01/17                    05/02/17  NO DRIVING for  1 week   ; you may begin driving on  4/65/03   .  WOUND CARE - Keep the wound area clean and dry.  Do not get this area wet for one week. No showers for one week; you may shower on  05/02/17   . - The tape/steri-strips on your wound will fall off; do not pull them off.  No bandage is needed on the site.  DO  NOT apply any creams, oils, or ointments to the wound area. - If you notice any drainage or discharge from the wound, any swelling or bruising at the site, or you develop a fever > 101? F after you are discharged home, call the office at once.  Special Instructions - You are still able to use cellular telephones; use the ear opposite the side where you have your pacemaker/defibrillator.  Avoid carrying your cellular phone near your device. - When traveling through airports, show security personnel your identification card to avoid being screened in the metal detectors.  Ask the security personnel to use the hand wand. - Avoid arc welding equipment, MRI testing (magnetic resonance imaging), TENS units (transcutaneous nerve stimulators).  Call the office for questions about other devices. - Avoid electrical appliances that are in poor condition or are not properly grounded. - Microwave ovens are safe to be near or to operate.

## 2017-04-28 ENCOUNTER — Encounter (HOSPITAL_COMMUNITY): Payer: Self-pay | Admitting: Internal Medicine

## 2017-04-29 ENCOUNTER — Telehealth: Payer: Self-pay | Admitting: Internal Medicine

## 2017-04-29 NOTE — Telephone Encounter (Signed)
New Message    Needs travel insurance paperwork filled out asap, they are dropping it in the mail today

## 2017-04-30 ENCOUNTER — Encounter: Payer: Self-pay | Admitting: Internal Medicine

## 2017-05-02 NOTE — Telephone Encounter (Signed)
Patient wife called with questions about patient blu sync monitor. I informed her that the monitor, monitors patient while he sleeps b/w 12 AM - 5 AM. Informed her that the phone or tablet needs to be at least 3 ft from where patient sleeps and the app needs to be running in the background in order for the monitor to work properly. Informed her that I have not received any disconnected monitor alerts and that I can only assume that the monitor is working properly. Informed her that on 05-09-17 is patient first remote transmission with this monitor. This transmission will occur b/w 12 AM and 5 AM. If the transmission is not automatic we will call and request a manual transmission. Pt wife verbalized understanding.

## 2017-05-06 NOTE — Telephone Encounter (Signed)
DPR on file to speak with wife.  Advised her Dr. Jackalyn Lombard nurse is out of the office today and I would send her the message and have her follow up in regards to paperwork.    Wife states pt's BP Sunday was 177/106 at 6pm.  Took Lisinopril and 2-3 hrs later came down to 142/87.  Pt had HA all day.  Further BP's given from today: 1AM- 165/98, then 142/92 No time given- 157/95 8AM- 126/84 Pt takes Metoprolol in AM and Lisinopril in the evening.  Denies any cardiac sx.  Advised I would send message to Dr. Domenic Polite and [facility] team for review and advisement.  Wife appreciative for call.

## 2017-05-06 NOTE — Telephone Encounter (Signed)
Happy to help out with recommendations, however blood pressure has really been managed by his PCP Dr. Quintin Alto. He has been on both Toprol-XL and lisinopril. The elevated blood pressure on Sunday seems to be out of proportion to his usual measurements. I would recommend continuing to track blood pressure twice a day, once in the morning and once in the evening. It could be that the lisinopril dose could be divided twice a day, although he may need higher dose if blood pressure trend is going up. They also need to communicate with his PCP so that he is apprised of the situation.

## 2017-05-06 NOTE — Telephone Encounter (Signed)
Follow Up Call:  Jonathan Lamb is calling to follow up on the travel insurance paperwork . They will be in the office on 05/09/17 and they can pick the paperwork up if it has not been mailed out already . Also on Sunday evening his blood pressure was 189/102 and ran high for about two hours and had a headache all day. He took all of medications on scheduling and then took his evening medication and he took a aspirin and finally it went down . Marland Kitchen Wants to know when his blood pressure runs that high what shall they do? Please call

## 2017-05-06 NOTE — Telephone Encounter (Signed)
Spoke with pt and went over recommendations per Dr. Domenic Polite.  Pt verbalized understanding and was in agreement with this plan.  Pt appreciative for call.

## 2017-05-08 ENCOUNTER — Telehealth: Payer: Self-pay | Admitting: Internal Medicine

## 2017-05-08 ENCOUNTER — Ambulatory Visit
Admission: RE | Admit: 2017-05-08 | Discharge: 2017-05-08 | Disposition: A | Discharge status: Home / Self Care | Payer: Medicare Other | Source: Ambulatory Visit | Attending: Internal Medicine | Admitting: Internal Medicine

## 2017-05-08 ENCOUNTER — Ambulatory Visit (INDEPENDENT_AMBULATORY_CARE_PROVIDER_SITE_OTHER): Payer: Medicare Other | Admitting: *Deleted

## 2017-05-08 DIAGNOSIS — I441 Atrioventricular block, second degree: Secondary | ICD-10-CM

## 2017-05-08 LAB — CUP PACEART INCLINIC DEVICE CHECK
Battery Remaining Longevity: 87 mo
Battery Voltage: 3.21 V
Brady Statistic AP VP Percent: 21.24 %
Brady Statistic AP VS Percent: 1.38 %
Brady Statistic AS VP Percent: 70.74 %
Brady Statistic AS VS Percent: 6.64 %
Brady Statistic RV Percent Paced: 91.98 %
Date Time Interrogation Session: 20180906161418
Implantable Lead Implant Date: 20180824
Implantable Lead Location: 753859
Implantable Lead Model: 5076
Implantable Lead Model: 5076
Implantable Pulse Generator Implant Date: 20180824
Lead Channel Impedance Value: 323 Ohm
Lead Channel Impedance Value: 380 Ohm
Lead Channel Impedance Value: 456 Ohm
Lead Channel Pacing Threshold Amplitude: 3.75 V
Lead Channel Sensing Intrinsic Amplitude: 3.9 mV
Lead Channel Sensing Intrinsic Amplitude: 4 mV
Lead Channel Setting Pacing Amplitude: 3.5 V
Lead Channel Setting Pacing Pulse Width: 0.4 ms
Lead Channel Setting Sensing Sensitivity: 0.9 mV
MDC IDC LEAD IMPLANT DT: 20180824
MDC IDC LEAD LOCATION: 753860
MDC IDC MSMT LEADCHNL RA IMPEDANCE VALUE: 418 Ohm
MDC IDC MSMT LEADCHNL RA PACING THRESHOLD AMPLITUDE: 0.5 V
MDC IDC MSMT LEADCHNL RA PACING THRESHOLD PULSEWIDTH: 0.4 ms
MDC IDC MSMT LEADCHNL RV PACING THRESHOLD PULSEWIDTH: 0.4 ms
MDC IDC SET LEADCHNL RV PACING AMPLITUDE: 4.75 V
MDC IDC STAT BRADY RA PERCENT PACED: 22.62 %

## 2017-05-08 NOTE — Progress Notes (Signed)
Wound check appointment. Steri-strips removed. Wound without redness or edema. Medial incision edge un-approximated, steri strips reapplied. RA Threshold, sensing, and impedances consistent with implant measurements. RV threshold elevated at 3.75V@ 0.36ms (implant values 0.5V@0 .70ms), R waves 3.5-3.19mV, impedance stable; GT ordered chest x-ray. Device programmed at auto capture for extra safety margin until 3 month visit, per GT. Histogram distribution appropriate for patient and level of activity. No mode switches or high ventricular rates noted. Patient educated about wound care (including signs and symptoms of infection), arm mobility, lifting restrictions. Wound re-check scheduled 05/19/17, ROV with JA 08/08/2017.

## 2017-05-08 NOTE — Telephone Encounter (Signed)
Patient aware Travel Form ready for pick up.

## 2017-05-09 ENCOUNTER — Ambulatory Visit (INDEPENDENT_AMBULATORY_CARE_PROVIDER_SITE_OTHER): Payer: Self-pay | Admitting: *Deleted

## 2017-05-09 ENCOUNTER — Telehealth: Payer: Self-pay | Admitting: Internal Medicine

## 2017-05-09 DIAGNOSIS — I441 Atrioventricular block, second degree: Secondary | ICD-10-CM

## 2017-05-09 NOTE — Telephone Encounter (Signed)
°  New Message   pt verbalized that she is calling for rn   To get results on the xray to see if the leads are in the right position

## 2017-05-09 NOTE — Progress Notes (Signed)
Remote pacemaker transmission.   

## 2017-05-12 NOTE — Telephone Encounter (Signed)
Spoke with patient regarding chest xray. Per Dr. Rayann Heman chest xray- lead position stable, recommends scheduling appointment with Dr. Rayann Heman 05/19/17. Advised patietn message would be sent to scheduler to schedule appointment with Dr. Rayann Heman 05/19/17

## 2017-05-12 NOTE — Telephone Encounter (Signed)
New message    Pt wife is calling about results of chest xray pt had last week. Please call.

## 2017-05-13 ENCOUNTER — Encounter: Payer: Self-pay | Admitting: Cardiology

## 2017-05-19 ENCOUNTER — Ambulatory Visit (INDEPENDENT_AMBULATORY_CARE_PROVIDER_SITE_OTHER): Payer: Self-pay | Admitting: Internal Medicine

## 2017-05-19 ENCOUNTER — Encounter: Payer: Self-pay | Admitting: Internal Medicine

## 2017-05-19 ENCOUNTER — Telehealth: Payer: Self-pay | Admitting: Internal Medicine

## 2017-05-19 VITALS — BP 140/92 | HR 66 | Ht 76.0 in | Wt 281.8 lb

## 2017-05-19 DIAGNOSIS — I441 Atrioventricular block, second degree: Secondary | ICD-10-CM

## 2017-05-19 NOTE — Telephone Encounter (Signed)
New Message     Does pt need to change his appt from [facility] to [place], just in case he needs to have another interrogation on his pacemaker?

## 2017-05-19 NOTE — Patient Instructions (Signed)
Medication Instructions:  Your physician recommends that you continue on your current medications as directed. Please refer to the Current Medication list given to you today.   Labwork: None ordered   Testing/Procedures: None ordered   Follow-Up:   Your physician recommends that you schedule a follow-up appointment as scheduled with Dr Rayann Heman on 08/08/17   Any Other Special Instructions Will Be Listed Below (If Applicable).     If you need a refill on your cardiac medications before your next appointment, please call your pharmacy.

## 2017-05-19 NOTE — Telephone Encounter (Signed)
Returned call to patient and let him know that he will be able to have his device checked at the [facility] office just as he does here.  He was appreciative of my call and will keep appointment as scheduled.

## 2017-05-19 NOTE — Progress Notes (Signed)
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  Since his recent PPM implant, the patient reports doing very well. His pacemaker pocket continues to heal.  He is very pleased that he feels "much better".  Exertional fatigue and dizziness are resolved!  Today, he denies symptoms of palpitations, chest pain, shortness of breath,  lower extremity edema, dizziness, presyncope, or syncope.  The patient is otherwise without complaint today.   Past Medical History:  Diagnosis Date  . Arthritis   . Benign prostatic hyperplasia   . Essential hypertension   . Hemorrhoid   . Hypercholesterolemia   . RBBB   . SVT (supraventricular tachycardia) (HCC)    Atrial tachycardia   Past Surgical History:  Procedure Laterality Date  . COLONOSCOPY N/A 03/24/2013   Procedure: COLONOSCOPY;  Surgeon: Rogene Houston, MD;  [facility];  Service: Endoscopy;  Laterality: N/A;  830  . LAMINECTOMY     Lumbar  . PACEMAKER IMPLANT N/A 04/25/2017   Procedure: Pacemaker Implant;  Surgeon: Thompson Grayer, MD;  Location: [facility] [facility];  Service: Cardiovascular;  Laterality: N/A;  . PROSTATE BIOPSY    . THYROID SURGERY  1999   L lobectomy, complicated by esophageal laceration requiring esophageal repair     ROS- all systems are reviewed and negative except as per HPI above  Current Outpatient Prescriptions  Medication Sig Dispense Refill  . aspirin 81 MG tablet Take 81 mg by mouth every evening.     . finasteride (PROSCAR) 5 MG tablet Take 5 mg by mouth every evening.     Marland Kitchen ibuprofen (ADVIL,MOTRIN) 200 MG tablet Take 400 mg by mouth daily as needed for headache.     . lisinopril (PRINIVIL,ZESTRIL) 10 MG tablet Take 5 mg by mouth 2 (two) times daily.   0  . metoprolol succinate (TOPROL-XL) 25 MG 24 hr tablet Take 1 tablet (25 mg total) by mouth daily. 90 tablet 3  .  Multiple Vitamin (MULTIVITAMIN) tablet Take 1 tablet by mouth every evening.     . multivitamin-lutein (OCUVITE-LUTEIN) CAPS capsule Take 1 capsule by mouth every evening.     . Omega-3 Fatty Acids (FISH OIL) 1200 MG CAPS Take 1,200 mg by mouth every evening.    . simvastatin (ZOCOR) 20 MG tablet Take 20 mg by mouth every evening.     . tamsulosin (FLOMAX) 0.4 MG CAPS capsule Take 0.4 mg by mouth every evening.      No current facility-administered medications for this visit.     Physical Exam: Vitals:   05/19/17 1035  BP: (!) 140/92  Pulse: 66  SpO2: 98%  Weight: 281 lb 12.8 oz (127.8 kg)  Height: 6\' 4"  (1.93 m)    GEN- The patient is well appearing, alert and oriented x 3 today.   Head- normocephalic, atraumatic Eyes-  Sclera clear, conjunctiva pink Ears- hearing intact Oropharynx- clear Lungs- Clear to ausculation bilaterally, normal work of breathing Chest- pacemaker pocket is healing, small scab over medial border of incision Heart- Regular rate and rhythm, no murmurs, rubs or gallops, PMI not laterally displaced GI- soft, NT, ND, + BS Extremities- no clubbing, cyanosis, or edema  Pacemaker interrogation- reviewed in detail today,  See PACEART report CXR 05/08/17 is personally reviewed and compared to 04/26/17 cxr.  Unchanged   Assessment and Plan:  1. Symptomatic second degree AV  block Normal pacemaker function RV pacing output has increased (currently 2V @ 43msec both unipolar or bipolar configurations) See Pace Art report Programmed RV pacing output 3.5V @ 1 msec today and adaptive pacing turned to monitor.  MVP turned on  Return as scheduled for 3 month device check Will monitor remotely in the interim  Thompson Grayer MD, [facility] 05/19/2017 10:59 AM

## 2017-05-23 ENCOUNTER — Telehealth: Payer: Self-pay | Admitting: Cardiology

## 2017-05-23 ENCOUNTER — Ambulatory Visit (INDEPENDENT_AMBULATORY_CARE_PROVIDER_SITE_OTHER): Payer: Self-pay | Admitting: *Deleted

## 2017-05-23 DIAGNOSIS — I441 Atrioventricular block, second degree: Secondary | ICD-10-CM

## 2017-05-23 NOTE — Telephone Encounter (Signed)
Spoke with pt and reminded pt of remote transmission that is due today. Pt verbalized understanding.   

## 2017-05-23 NOTE — Progress Notes (Signed)
Remote pacemaker transmission.   

## 2017-05-26 LAB — CUP PACEART REMOTE DEVICE CHECK
Battery Remaining Longevity: 96 mo
Battery Voltage: 3.21 V
Brady Statistic RA Percent Paced: 23.77 %
Implantable Lead Implant Date: 20180824
Implantable Lead Location: 753859
Implantable Lead Location: 753860
Implantable Lead Model: 5076
Implantable Pulse Generator Implant Date: 20180824
Lead Channel Impedance Value: 323 Ohm
Lead Channel Pacing Threshold Pulse Width: 0.4 ms
Lead Channel Pacing Threshold Pulse Width: 0.4 ms
Lead Channel Setting Pacing Amplitude: 3.5 V
Lead Channel Setting Pacing Amplitude: 3.5 V
Lead Channel Setting Pacing Pulse Width: 1 ms
MDC IDC LEAD IMPLANT DT: 20180824
MDC IDC MSMT LEADCHNL RA IMPEDANCE VALUE: 418 Ohm
MDC IDC MSMT LEADCHNL RA PACING THRESHOLD AMPLITUDE: 0.375 V
MDC IDC MSMT LEADCHNL RA SENSING INTR AMPL: 2.25 mV
MDC IDC MSMT LEADCHNL RA SENSING INTR AMPL: 2.25 mV
MDC IDC MSMT LEADCHNL RV IMPEDANCE VALUE: 399 Ohm
MDC IDC MSMT LEADCHNL RV IMPEDANCE VALUE: 494 Ohm
MDC IDC MSMT LEADCHNL RV PACING THRESHOLD AMPLITUDE: 2.375 V
MDC IDC MSMT LEADCHNL RV SENSING INTR AMPL: 5.875 mV
MDC IDC MSMT LEADCHNL RV SENSING INTR AMPL: 5.875 mV
MDC IDC SESS DTM: 20180921134502
MDC IDC SET LEADCHNL RV SENSING SENSITIVITY: 0.9 mV
MDC IDC STAT BRADY AP VP PERCENT: 2.18 %
MDC IDC STAT BRADY AP VS PERCENT: 21.64 %
MDC IDC STAT BRADY AS VP PERCENT: 26.06 %
MDC IDC STAT BRADY AS VS PERCENT: 50.13 %
MDC IDC STAT BRADY RV PERCENT PACED: 28.24 %

## 2017-05-27 ENCOUNTER — Telehealth: Payer: Self-pay | Admitting: Internal Medicine

## 2017-05-27 LAB — CUP PACEART INCLINIC DEVICE CHECK
Battery Remaining Longevity: 90 mo
Battery Voltage: 3.21 V
Brady Statistic AP VS Percent: 5.01 %
Brady Statistic AS VS Percent: 49.33 %
Implantable Lead Implant Date: 20180824
Implantable Lead Model: 5076
Implantable Pulse Generator Implant Date: 20180824
Lead Channel Impedance Value: 304 Ohm
Lead Channel Impedance Value: 380 Ohm
Lead Channel Pacing Threshold Amplitude: 0.375 V
Lead Channel Pacing Threshold Amplitude: 2.25 V
Lead Channel Pacing Threshold Pulse Width: 0.4 ms
Lead Channel Sensing Intrinsic Amplitude: 3.125 mV
Lead Channel Sensing Intrinsic Amplitude: 3.125 mV
Lead Channel Sensing Intrinsic Amplitude: 5 mV
Lead Channel Setting Pacing Amplitude: 3.5 V
MDC IDC LEAD IMPLANT DT: 20180824
MDC IDC LEAD LOCATION: 753859
MDC IDC LEAD LOCATION: 753860
MDC IDC MSMT LEADCHNL RA PACING THRESHOLD PULSEWIDTH: 0.4 ms
MDC IDC MSMT LEADCHNL RV IMPEDANCE VALUE: 380 Ohm
MDC IDC MSMT LEADCHNL RV IMPEDANCE VALUE: 456 Ohm
MDC IDC MSMT LEADCHNL RV SENSING INTR AMPL: 5 mV
MDC IDC SESS DTM: 20180917144257
MDC IDC SET LEADCHNL RA PACING AMPLITUDE: 3.5 V
MDC IDC SET LEADCHNL RV PACING PULSEWIDTH: 1 ms
MDC IDC SET LEADCHNL RV SENSING SENSITIVITY: 0.9 mV
MDC IDC STAT BRADY AP VP PERCENT: 34.2 %
MDC IDC STAT BRADY AS VP PERCENT: 11.45 %
MDC IDC STAT BRADY RA PERCENT PACED: 39.17 %
MDC IDC STAT BRADY RV PERCENT PACED: 45.65 %

## 2017-05-27 NOTE — Telephone Encounter (Signed)
New Message  Pt c/o BP issue: STAT if pt c/o blurred vision, one-sided weakness or slurred speech  1. What are your last 5 BP readings? 9/21) 147/86, 145/87 9/22) 142/86, 166/110, 165/101, 183/111 9/23) 166/111, 185/104 9/24) 148/94, 153/92, 150/92,112/82, 173/116 pm 181/116,166/108, 9/25) 170/112, 140/94, 150/99  2. Are you having any other symptoms (ex. Dizziness, headache, blurred vision, passed out)?   3. What is your BP issue? Per pt would like to speak with RN. Please call back to discuss

## 2017-05-28 ENCOUNTER — Encounter: Payer: Self-pay | Admitting: Cardiology

## 2017-05-28 NOTE — Telephone Encounter (Signed)
Noted. Blood pressure trend is significantly elevated. I am glad that he has made a visit with his PCP Dr. Quintin Alto as this is the most appropriate step. He is on Toprol-XL and lisinopril, likely needs an increase in lisinopril dose. As far as follow-up, a visit in November would be reasonable from a cardiac rhythm perspective. Seems unlikely that the recent pacemaker is related to his elevation in blood pressure however.

## 2017-05-28 NOTE — Telephone Encounter (Signed)
Wife Rod Holler) notified.  Routine follow up scheduled for 07/15/2017 in [place] office with Dr. Domenic Polite.

## 2017-05-28 NOTE — Telephone Encounter (Signed)
Will route this to Dr Domenic Polite as he is his primary cardiologist

## 2017-05-28 NOTE — Telephone Encounter (Signed)
Spoke with wife Rod Holler) regarding BP issues below.  Stated that they called Dr. Jackalyn Lombard office yesterday due to him having recent pacemaker insertion.  Stated that she was concerned that the elevated blood pressures could be coming from that.  Wife stated that they have scheduled OV with Dr. Quintin Alto this morning to discuss BP elevation.  With so many doctors, she is not sure who is to manage the BP.  Also, need clarification on when to have follow up with Dr. Domenic Polite.  We have recall in for November, but last OV states pending test results.  On monitor results, he was referred to Dr. Rayann Heman.  Please advise.

## 2017-05-30 ENCOUNTER — Encounter: Payer: Self-pay | Admitting: Internal Medicine

## 2017-05-30 LAB — CUP PACEART REMOTE DEVICE CHECK
Battery Remaining Longevity: 51 mo
Brady Statistic AP VS Percent: 3.08 %
Brady Statistic AS VP Percent: 38.06 %
Brady Statistic RA Percent Paced: 42.68 %
Brady Statistic RV Percent Paced: 77.67 %
Date Time Interrogation Session: 20180907100233
Implantable Lead Implant Date: 20180824
Implantable Lead Implant Date: 20180824
Implantable Lead Location: 753860
Implantable Lead Model: 5076
Implantable Lead Model: 5076
Lead Channel Impedance Value: 304 Ohm
Lead Channel Impedance Value: 437 Ohm
Lead Channel Pacing Threshold Pulse Width: 0.4 ms
Lead Channel Sensing Intrinsic Amplitude: 3.875 mV
Lead Channel Sensing Intrinsic Amplitude: 4.5 mV
Lead Channel Sensing Intrinsic Amplitude: 4.5 mV
Lead Channel Setting Pacing Amplitude: 5 V
Lead Channel Setting Pacing Pulse Width: 1 ms
Lead Channel Setting Sensing Sensitivity: 0.9 mV
MDC IDC LEAD LOCATION: 753859
MDC IDC MSMT BATTERY VOLTAGE: 3.2 V
MDC IDC MSMT LEADCHNL RA IMPEDANCE VALUE: 399 Ohm
MDC IDC MSMT LEADCHNL RA PACING THRESHOLD AMPLITUDE: 0.375 V
MDC IDC MSMT LEADCHNL RA SENSING INTR AMPL: 3.875 mV
MDC IDC MSMT LEADCHNL RV IMPEDANCE VALUE: 361 Ohm
MDC IDC MSMT LEADCHNL RV PACING THRESHOLD AMPLITUDE: 2.25 V
MDC IDC MSMT LEADCHNL RV PACING THRESHOLD PULSEWIDTH: 0.4 ms
MDC IDC PG IMPLANT DT: 20180824
MDC IDC SET LEADCHNL RA PACING AMPLITUDE: 3.5 V
MDC IDC STAT BRADY AP VP PERCENT: 39.61 %
MDC IDC STAT BRADY AS VS PERCENT: 19.25 %

## 2017-06-02 NOTE — Telephone Encounter (Signed)
Spoke to wife and patient about results from 9/21 remote. I informed them that the automatic threshold test is only ran at a PW of 0.10ms and not the 1.78ms PW that it was reprogrammed to a few weeks ago. I explained to them that the voltage that was obtained is stable compared to the previous measurements at 0.76ms. I explained to them that the threshold would be lower at a PW of 1.72ms, and that overall everything appears to be stable. Patient and wife verbalized understanding and appreciation of information.

## 2017-07-14 ENCOUNTER — Encounter: Payer: Self-pay | Admitting: Cardiology

## 2017-07-14 NOTE — Progress Notes (Signed)
Cardiology Office Note  Date: 07/15/2017   ID: Jonathan Lamb, DOB Oct 28, 1945, MRN [id number]  PCP: Manon Hilding, MD  Primary Cardiologist: Rozann Lesches, MD   Chief Complaint  Patient presents with  . Cardiac follow-up    History of Present Illness: Jonathan Lamb is a 71 y.o. male last seen in July.  Since that time I referred him to EP and he ultimately underwent placement of a Medtronic pacemaker due to symptomatic Mobitz type II second-degree heart block.  He last saw Dr. Rayann Heman in September.   He is here today with his wife for follow-up visit.  He states that overall he feels better, has not been having the more intense episodes of lightheadedness and near syncope.  He does not report palpitations, still experiences dyspnea on exertion.  He does not report angina.  I reviewed his medications.  He continues on Toprol-XL at 25 mg daily with history of atrial tachycardia.  He has a planned device interrogation on December 8.  Blood pressure control is reasonable today.  He is now on Cozaar and HCTZ instead of lisinopril.  He tends to follow with Dr. Quintin Alto.  Past Medical History:  Diagnosis Date  . Arthritis   . Benign prostatic hyperplasia   . Essential hypertension   . Hemorrhoid   . Hypercholesterolemia   . RBBB   . Second degree heart block    Medtronic pacemaker -Dr. Rayann Heman  . SVT (supraventricular tachycardia) (HCC)    Atrial tachycardia    Past Surgical History:  Procedure Laterality Date  . LAMINECTOMY     Lumbar  . PROSTATE BIOPSY    . THYROID SURGERY  1999   L lobectomy, complicated by esophageal laceration requiring esophageal repair     Current Outpatient Medications  Medication Sig Dispense Refill  . aspirin 81 MG tablet Take 81 mg by mouth every evening.     . finasteride (PROSCAR) 5 MG tablet Take 5 mg by mouth every evening.     . hydrochlorothiazide (HYDRODIURIL) 25 MG tablet Take 25 mg daily by mouth.    Marland Kitchen ibuprofen (ADVIL,MOTRIN) 200  MG tablet Take 400 mg by mouth daily as needed for headache.     . losartan (COZAAR) 50 MG tablet Take 50 mg daily by mouth.    . metoprolol succinate (TOPROL-XL) 25 MG 24 hr tablet Take 1 tablet (25 mg total) by mouth daily. 90 tablet 3  . Multiple Vitamin (MULTIVITAMIN) tablet Take 1 tablet by mouth every evening.     . multivitamin-lutein (OCUVITE-LUTEIN) CAPS capsule Take 1 capsule by mouth every evening.     . Omega-3 Fatty Acids (FISH OIL) 1200 MG CAPS Take 1,200 mg by mouth every evening.    . simvastatin (ZOCOR) 20 MG tablet Take 20 mg by mouth every evening.     . tamsulosin (FLOMAX) 0.4 MG CAPS capsule Take 0.4 mg by mouth every evening.      No current facility-administered medications for this visit.    Allergies:  Patient has no known allergies.   Social History: The patient  reports that  has never smoked. he has never used smokeless tobacco. He reports that he drinks alcohol. He reports that he does not use drugs.   ROS:  Please see the history of present illness. Otherwise, complete review of systems is positive for none.  All other systems are reviewed and negative.   Physical Exam: VS:  BP (!) 142/78 (BP Location: Left Arm)   Pulse  72   Ht 6\' 4"  (1.93 m)   Wt 283 lb (128.4 kg)   SpO2 98%   BMI 34.45 kg/m , BMI Body mass index is 34.45 kg/m.  Wt Readings from Last 3 Encounters:  07/15/17 283 lb (128.4 kg)  05/19/17 281 lb 12.8 oz (127.8 kg)  04/25/17 279 lb 15.8 oz (127 kg)    General: Overweight male, appears comfortable at rest. HEENT: Conjunctiva and lids normal, oropharynx clear. Neck: Supple, no elevated JVP or carotid bruits, no thyromegaly. Lungs: Clear to auscultation, nonlabored breathing at rest. Cardiac: Regular rate and rhythm, no S3, 2/6 systolic murmur, no pericardial rub. Abdomen: Soft, nontender, bowel sounds present, no guarding or rebound. Extremities: No pitting edema, distal pulses 2+. Skin: Warm and dry. Musculoskeletal: No  kyphosis. Neuropsychiatric: Alert and oriented x3, affect grossly appropriate.  ECG: I personally reviewed the tracing from 04/26/2017 which showed sinus rhythm with incomplete right bundle branch block.  Recent Labwork: 03/18/2017: Magnesium 2.2 04/14/2017: BUN 16; Creatinine, Ser 1.01; Hemoglobin 13.5; Platelets 207; Potassium 4.3; Sodium 139     Component Value Date/Time   CHOL 147 03/16/2017 1300   TRIG 146 03/16/2017 1300   HDL 42 03/16/2017 1300   CHOLHDL 3.5 03/16/2017 1300   VLDL 29 03/16/2017 1300   LDLCALC 76 03/16/2017 1300    Other Studies Reviewed Today:  Echocardiogram 04/14/2017: Study Conclusions  - Left ventricle: The cavity size was normal. Wall thickness was   normal. Systolic function was normal. The estimated ejection   fraction was in the range of 55% to 60%. - Mitral valve: Calcified annulus. Mildly thickened leaflets . - Left atrium: The atrium was mildly dilated. - Atrial septum: No defect or patent foramen ovale was identified.  Assessment and Plan:  1.  Paroxysmal atrial tachycardia.  Plan to continue Toprol-XL at 25 mg daily.  Could consider further advancing this if device interrogation indicates increased episodes of tachycardia.  2.  Symptomatic second-degree heart block now status post Medtronic pacemaker by Dr. Rayann Heman.  Continue with device interrogation.  3.  Essential hypertension.  Reviewed current regimen.  No changes made today.  Keep follow-up with Dr. Quintin Alto.  4.  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  Continue to monitor.  Current medicines were reviewed with the patient today.  Disposition: Follow up in 6 months.  Signed, Satira Sark, MD, [facility] 07/15/2017 9:47 AM    [place] [place]. [address] [place], [place] [address] Phone: [phone]; Fax: [phone]

## 2017-07-15 ENCOUNTER — Ambulatory Visit: Payer: Medicare Other | Admitting: Cardiology

## 2017-07-15 ENCOUNTER — Encounter: Payer: Self-pay | Admitting: Cardiology

## 2017-07-15 VITALS — BP 142/78 | HR 72 | Ht 76.0 in | Wt 283.0 lb

## 2017-07-15 DIAGNOSIS — I10 Essential (primary) hypertension: Secondary | ICD-10-CM | POA: Diagnosis not present

## 2017-07-15 DIAGNOSIS — I441 Atrioventricular block, second degree: Secondary | ICD-10-CM | POA: Diagnosis not present

## 2017-07-15 DIAGNOSIS — I471 Supraventricular tachycardia: Secondary | ICD-10-CM

## 2017-07-15 DIAGNOSIS — I358 Other nonrheumatic aortic valve disorders: Secondary | ICD-10-CM

## 2017-07-15 NOTE — Patient Instructions (Signed)
Your physician wants you to follow-up in: 6 months [place] office with Dr.McDowell You will receive a reminder letter in the mail two months in advance. If you don't receive a letter, please call our office to schedule the follow-up appointment.    Your physician recommends that you continue on your current medications as directed. Please refer to the Current Medication list given to you today.   If you need a refill on your cardiac medications before your next appointment, please call your pharmacy.    No lab work or test ordered today.       Thank you for choosing [facility] !

## 2017-07-28 ENCOUNTER — Ambulatory Visit (INDEPENDENT_AMBULATORY_CARE_PROVIDER_SITE_OTHER): Payer: Self-pay | Admitting: *Deleted

## 2017-07-28 DIAGNOSIS — I441 Atrioventricular block, second degree: Secondary | ICD-10-CM

## 2017-07-29 NOTE — Progress Notes (Signed)
Remote pacemaker transmission.   

## 2017-07-31 LAB — CUP PACEART REMOTE DEVICE CHECK
Battery Voltage: 3.15 V
Brady Statistic AP VP Percent: 11.76 %
Brady Statistic AS VP Percent: 36.67 %
Brady Statistic AS VS Percent: 30.05 %
Brady Statistic RV Percent Paced: 48.43 %
Date Time Interrogation Session: 20181126043205
Implantable Lead Implant Date: 20180824
Implantable Lead Location: 753860
Implantable Lead Model: 5076
Implantable Pulse Generator Implant Date: 20180824
Lead Channel Impedance Value: 304 Ohm
Lead Channel Impedance Value: 342 Ohm
Lead Channel Impedance Value: 418 Ohm
Lead Channel Impedance Value: 437 Ohm
Lead Channel Pacing Threshold Amplitude: 0.5 V
Lead Channel Pacing Threshold Amplitude: 2.25 V
Lead Channel Sensing Intrinsic Amplitude: 6.5 mV
Lead Channel Setting Pacing Amplitude: 3 V
Lead Channel Setting Pacing Amplitude: 3.5 V
Lead Channel Setting Pacing Pulse Width: 1 ms
Lead Channel Setting Sensing Sensitivity: 0.9 mV
MDC IDC LEAD IMPLANT DT: 20180824
MDC IDC LEAD LOCATION: 753859
MDC IDC MSMT BATTERY REMAINING LONGEVITY: 94 mo
MDC IDC MSMT LEADCHNL RA PACING THRESHOLD PULSEWIDTH: 0.4 ms
MDC IDC MSMT LEADCHNL RA SENSING INTR AMPL: 1.875 mV
MDC IDC MSMT LEADCHNL RA SENSING INTR AMPL: 1.875 mV
MDC IDC MSMT LEADCHNL RV PACING THRESHOLD PULSEWIDTH: 0.4 ms
MDC IDC MSMT LEADCHNL RV SENSING INTR AMPL: 6.5 mV
MDC IDC STAT BRADY AP VS PERCENT: 21.51 %
MDC IDC STAT BRADY RA PERCENT PACED: 33.27 %

## 2017-08-01 ENCOUNTER — Encounter: Payer: Self-pay | Admitting: Cardiology

## 2017-08-08 ENCOUNTER — Encounter: Payer: Self-pay | Admitting: Internal Medicine

## 2017-08-08 ENCOUNTER — Ambulatory Visit: Payer: Medicare Other | Admitting: Internal Medicine

## 2017-08-08 VITALS — BP 155/90 | HR 61 | Ht 76.0 in | Wt 290.8 lb

## 2017-08-08 DIAGNOSIS — I441 Atrioventricular block, second degree: Secondary | ICD-10-CM | POA: Diagnosis not present

## 2017-08-08 DIAGNOSIS — I10 Essential (primary) hypertension: Secondary | ICD-10-CM | POA: Diagnosis not present

## 2017-08-08 MED ORDER — LOSARTAN POTASSIUM 100 MG PO TABS: 100.0000 mg | ORAL_TABLET | Freq: Every day | ORAL | 3 refills | Status: DC

## 2017-08-08 NOTE — Patient Instructions (Signed)
Medication Instructions:   Your physician has recommended you make the following change in your medication:   Increase losartan to 100 mg by mouth daily. You may take (2) of your 50 mg tablets daily until they are finished.  Continue all other medications the same.  Labwork:  NONE  Testing/Procedures:  NONE  Follow-Up: Your physician recommends that you schedule a follow-up appointment in: 1 year. Please schedule this appointment today before leaving the office.  Any Other Special Instructions Will Be Listed Below (If Applicable).  Your next device check from home is on 10/27/17.  If you need a refill on your cardiac medications before your next appointment, please call your pharmacy.

## 2017-08-08 NOTE — Progress Notes (Signed)
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  Since last being seen in our clinic, the patient reports doing very well.  Today, he denies symptoms of palpitations, chest pain,  lower extremity edema, dizziness, presyncope, or syncope.  He has mild SOB but overall feels that this is improving.  The patient is otherwise without complaint today.   Past Medical History:  Diagnosis Date  . Arthritis   . Benign prostatic hyperplasia   . Essential hypertension   . Hemorrhoid   . Hypercholesterolemia   . RBBB   . Second degree heart block    Medtronic pacemaker -Dr. Rayann Heman  . SVT (supraventricular tachycardia) (HCC)    Atrial tachycardia   Past Surgical History:  Procedure Laterality Date  . COLONOSCOPY N/A 03/24/2013   Procedure: COLONOSCOPY;  Surgeon: Rogene Houston, MD;  [facility];  Service: Endoscopy;  Laterality: N/A;  830  . LAMINECTOMY     Lumbar  . PACEMAKER IMPLANT N/A 04/25/2017   Procedure: Pacemaker Implant;  Surgeon: Thompson Grayer, MD;  Location: [place] [facility];  Service: Cardiovascular;  Laterality: N/A;  . PROSTATE BIOPSY    . THYROID SURGERY  1999   L lobectomy, complicated by esophageal laceration requiring esophageal repair     ROS- all systems are reviewed and negative except as per HPI above  Current Outpatient Medications  Medication Sig Dispense Refill  . aspirin 81 MG tablet Take 81 mg by mouth every evening.     . finasteride (PROSCAR) 5 MG tablet Take 5 mg by mouth every evening.     . hydrochlorothiazide (HYDRODIURIL) 12.5 MG tablet Take 12.5 mg by mouth daily.    Marland Kitchen ibuprofen (ADVIL,MOTRIN) 200 MG tablet Take 400 mg by mouth daily as needed for headache.     . losartan (COZAAR) 50 MG tablet Take 50 mg daily by mouth.    . metoprolol succinate (TOPROL-XL) 25 MG 24 hr tablet Take 1 tablet (25 mg total) by  mouth daily. 90 tablet 3  . Multiple Vitamin (MULTIVITAMIN) tablet Take 1 tablet by mouth every evening.     . multivitamin-lutein (OCUVITE-LUTEIN) CAPS capsule Take 1 capsule by mouth every evening.     . naproxen sodium (ALEVE) 220 MG tablet Take 220 mg by mouth daily as needed.    . Omega-3 Fatty Acids (FISH OIL) 1200 MG CAPS Take 1,200 mg by mouth every evening.    . simvastatin (ZOCOR) 20 MG tablet Take 20 mg by mouth every evening.     . tamsulosin (FLOMAX) 0.4 MG CAPS capsule Take 0.4 mg by mouth every evening.      No current facility-administered medications for this visit.     Physical Exam: Vitals:   08/08/17 0817  BP: (!) 155/90  Pulse: 61  SpO2: 95%  Weight: 290 lb 12.8 oz (131.9 kg)  Height: 6\' 4"  (1.93 m)    GEN- The patient is well appearing, alert and oriented x 3 today.   Head- normocephalic, atraumatic Eyes-  Sclera clear, conjunctiva pink Ears- hearing intact Oropharynx- clear Lungs- Clear to ausculation bilaterally, normal work of breathing Chest- pacemaker pocket is well healed Heart- Regular rate and rhythm, no murmurs, rubs or gallops, PMI not laterally displaced GI- soft, NT, ND, + BS Extremities- no clubbing, cyanosis, or edema  Pacemaker interrogation- reviewed in detail today,  See PACEART  report   myoview and echo from earlier this year discussed with the patient today  Assessment and Plan:  1. Symptomatic second degree AV block Normal pacemaker function See Pace Art report RV threshold is chronically elevated No changes today  2. HTN BP is elevated today  Increase losartan to 100mg  daily  3. SOB Regular exercise encouraged  [facility] Return to see me in a year  Thompson Grayer MD, [facility] 08/08/2017 8:57 AM

## 2017-08-11 LAB — CUP PACEART INCLINIC DEVICE CHECK
Brady Statistic AP VS Percent: 19.49 %
Brady Statistic AS VP Percent: 40.41 %
Brady Statistic RV Percent Paced: 52.93 %
Date Time Interrogation Session: 20181207133721
Implantable Lead Location: 753860
Implantable Lead Model: 5076
Lead Channel Impedance Value: 380 Ohm
Lead Channel Sensing Intrinsic Amplitude: 4.375 mV
Lead Channel Sensing Intrinsic Amplitude: 4.5 mV
Lead Channel Setting Pacing Amplitude: 3.5 V
Lead Channel Setting Pacing Pulse Width: 1 ms
Lead Channel Setting Sensing Sensitivity: 0.9 mV
MDC IDC LEAD IMPLANT DT: 20180824
MDC IDC LEAD IMPLANT DT: 20180824
MDC IDC LEAD LOCATION: 753859
MDC IDC MSMT BATTERY REMAINING LONGEVITY: 91 mo
MDC IDC MSMT BATTERY VOLTAGE: 3.14 V
MDC IDC MSMT LEADCHNL RA IMPEDANCE VALUE: 285 Ohm
MDC IDC MSMT LEADCHNL RA IMPEDANCE VALUE: 380 Ohm
MDC IDC MSMT LEADCHNL RA PACING THRESHOLD AMPLITUDE: 0.5 V
MDC IDC MSMT LEADCHNL RA PACING THRESHOLD PULSEWIDTH: 0.4 ms
MDC IDC MSMT LEADCHNL RA SENSING INTR AMPL: 3.125 mV
MDC IDC MSMT LEADCHNL RA SENSING INTR AMPL: 4 mV
MDC IDC MSMT LEADCHNL RV IMPEDANCE VALUE: 304 Ohm
MDC IDC MSMT LEADCHNL RV PACING THRESHOLD AMPLITUDE: 2.25 V
MDC IDC MSMT LEADCHNL RV PACING THRESHOLD PULSEWIDTH: 1 ms
MDC IDC PG IMPLANT DT: 20180824
MDC IDC SET LEADCHNL RA PACING AMPLITUDE: 1.5 V
MDC IDC STAT BRADY AP VP PERCENT: 12.52 %
MDC IDC STAT BRADY AS VS PERCENT: 27.58 %
MDC IDC STAT BRADY RA PERCENT PACED: 32 %

## 2017-10-27 ENCOUNTER — Encounter: Payer: Self-pay | Admitting: Internal Medicine

## 2017-10-27 ENCOUNTER — Ambulatory Visit (INDEPENDENT_AMBULATORY_CARE_PROVIDER_SITE_OTHER): Payer: Medicare Other | Admitting: *Deleted

## 2017-10-27 DIAGNOSIS — I441 Atrioventricular block, second degree: Secondary | ICD-10-CM | POA: Diagnosis not present

## 2017-10-27 NOTE — Progress Notes (Signed)
Remote pacemaker transmission.   

## 2017-10-30 ENCOUNTER — Encounter: Payer: Self-pay | Admitting: Cardiology

## 2017-11-11 LAB — CUP PACEART REMOTE DEVICE CHECK
Battery Remaining Longevity: 51 mo
Battery Voltage: 3 V
Brady Statistic AP VS Percent: 7.11 %
Brady Statistic RA Percent Paced: 33.86 %
Date Time Interrogation Session: 20190225040505
Implantable Lead Implant Date: 20180824
Implantable Lead Location: 753859
Implantable Lead Model: 5076
Lead Channel Pacing Threshold Amplitude: 0.5 V
Lead Channel Pacing Threshold Pulse Width: 0.4 ms
Lead Channel Pacing Threshold Pulse Width: 0.4 ms
Lead Channel Sensing Intrinsic Amplitude: 4 mV
Lead Channel Sensing Intrinsic Amplitude: 4 mV
Lead Channel Sensing Intrinsic Amplitude: 6.25 mV
Lead Channel Setting Pacing Amplitude: 5 V
MDC IDC LEAD IMPLANT DT: 20180824
MDC IDC LEAD LOCATION: 753860
MDC IDC MSMT LEADCHNL RA IMPEDANCE VALUE: 323 Ohm
MDC IDC MSMT LEADCHNL RA IMPEDANCE VALUE: 380 Ohm
MDC IDC MSMT LEADCHNL RV IMPEDANCE VALUE: 304 Ohm
MDC IDC MSMT LEADCHNL RV IMPEDANCE VALUE: 399 Ohm
MDC IDC MSMT LEADCHNL RV PACING THRESHOLD AMPLITUDE: 2.5 V
MDC IDC MSMT LEADCHNL RV SENSING INTR AMPL: 6.25 mV
MDC IDC PG IMPLANT DT: 20180824
MDC IDC SET LEADCHNL RA PACING AMPLITUDE: 1.5 V
MDC IDC SET LEADCHNL RV PACING PULSEWIDTH: 1 ms
MDC IDC SET LEADCHNL RV SENSING SENSITIVITY: 0.9 mV
MDC IDC STAT BRADY AP VP PERCENT: 26.77 %
MDC IDC STAT BRADY AS VP PERCENT: 62.69 %
MDC IDC STAT BRADY AS VS PERCENT: 3.44 %
MDC IDC STAT BRADY RV PERCENT PACED: 89.46 %

## 2017-11-17 ENCOUNTER — Ambulatory Visit (INDEPENDENT_AMBULATORY_CARE_PROVIDER_SITE_OTHER): Payer: Medicare Other | Admitting: *Deleted

## 2017-11-17 DIAGNOSIS — I441 Atrioventricular block, second degree: Secondary | ICD-10-CM | POA: Diagnosis not present

## 2017-11-17 LAB — CUP PACEART INCLINIC DEVICE CHECK
Brady Statistic AS VP Percent: 63.9 %
Brady Statistic RA Percent Paced: 33.38 %
Date Time Interrogation Session: 20190318165310
Implantable Lead Implant Date: 20180824
Implantable Lead Location: 753859
Implantable Lead Model: 5076
Implantable Pulse Generator Implant Date: 20180824
Lead Channel Impedance Value: 361 Ohm
Lead Channel Impedance Value: 361 Ohm
Lead Channel Pacing Threshold Amplitude: 2.5 V
Lead Channel Pacing Threshold Pulse Width: 0.4 ms
Lead Channel Sensing Intrinsic Amplitude: 3.375 mV
Lead Channel Sensing Intrinsic Amplitude: 6.25 mV
Lead Channel Setting Pacing Amplitude: 1.5 V
Lead Channel Setting Pacing Pulse Width: 1 ms
Lead Channel Setting Sensing Sensitivity: 0.9 mV
MDC IDC LEAD IMPLANT DT: 20180824
MDC IDC LEAD LOCATION: 753860
MDC IDC MSMT BATTERY REMAINING LONGEVITY: 55 mo
MDC IDC MSMT BATTERY VOLTAGE: 2.98 V
MDC IDC MSMT LEADCHNL RA IMPEDANCE VALUE: 304 Ohm
MDC IDC MSMT LEADCHNL RA PACING THRESHOLD AMPLITUDE: 0.5 V
MDC IDC MSMT LEADCHNL RA SENSING INTR AMPL: 4 mV
MDC IDC MSMT LEADCHNL RV IMPEDANCE VALUE: 285 Ohm
MDC IDC MSMT LEADCHNL RV PACING THRESHOLD PULSEWIDTH: 0.4 ms
MDC IDC MSMT LEADCHNL RV SENSING INTR AMPL: 6.25 mV
MDC IDC SET LEADCHNL RV PACING AMPLITUDE: 4.25 V
MDC IDC STAT BRADY AP VP PERCENT: 27.81 %
MDC IDC STAT BRADY AP VS PERCENT: 5.59 %
MDC IDC STAT BRADY AS VS PERCENT: 2.7 %
MDC IDC STAT BRADY RV PERCENT PACED: 91.71 %

## 2017-11-17 NOTE — Progress Notes (Signed)
Pacemaker check in clinic d/t pt concerned about decrease in battery longevity from 7 years in Dec to 4 years in Feb. normal device function. Thresholds, sensing, impedances consistent with previous measurements. Pt has known high RV threshold, chronically 2.25V@1 .76ms. Explained to pt that he has had an increase in his VP percentage from 52% in Dec. to 91% in Feb. Explained to pt that due to this increase in the amount of pacing and the device having to use more energy to pace his heart this shortens the battery life. Informed pt that per policy and without MD recommendations I had to program the device with a 2x safety margin. Pt voiced understanding.  RV output programmed to 4.25V@1 .11ms with monitor on. Battery life extended from 3.8 years (initial programming/ RV adaptive) to 4.4 years . Remote 01/23/2018 and ROV w/ JA 08/2017

## 2018-01-23 ENCOUNTER — Ambulatory Visit: Payer: Medicare Other | Admitting: Cardiology

## 2018-01-27 ENCOUNTER — Ambulatory Visit (INDEPENDENT_AMBULATORY_CARE_PROVIDER_SITE_OTHER): Payer: Medicare Other | Admitting: *Deleted

## 2018-01-27 DIAGNOSIS — I441 Atrioventricular block, second degree: Secondary | ICD-10-CM | POA: Diagnosis not present

## 2018-01-27 NOTE — Progress Notes (Signed)
Remote pacemaker transmission.   

## 2018-01-29 LAB — CUP PACEART REMOTE DEVICE CHECK
Battery Remaining Longevity: 50 mo
Battery Voltage: 2.97 V
Brady Statistic AP VP Percent: 34.46 %
Brady Statistic RV Percent Paced: 95.18 %
Date Time Interrogation Session: 20190528013426
Implantable Lead Implant Date: 20180824
Implantable Lead Implant Date: 20180824
Implantable Lead Location: 753860
Implantable Lead Model: 5076
Implantable Pulse Generator Implant Date: 20180824
Lead Channel Impedance Value: 285 Ohm
Lead Channel Impedance Value: 361 Ohm
Lead Channel Impedance Value: 380 Ohm
Lead Channel Pacing Threshold Amplitude: 0.5 V
Lead Channel Pacing Threshold Amplitude: 2.5 V
Lead Channel Sensing Intrinsic Amplitude: 3.125 mV
Lead Channel Sensing Intrinsic Amplitude: 7.125 mV
Lead Channel Setting Pacing Amplitude: 1.5 V
Lead Channel Setting Pacing Amplitude: 4.25 V
Lead Channel Setting Sensing Sensitivity: 0.9 mV
MDC IDC LEAD LOCATION: 753859
MDC IDC MSMT LEADCHNL RA IMPEDANCE VALUE: 304 Ohm
MDC IDC MSMT LEADCHNL RA PACING THRESHOLD PULSEWIDTH: 0.4 ms
MDC IDC MSMT LEADCHNL RA SENSING INTR AMPL: 3.125 mV
MDC IDC MSMT LEADCHNL RV PACING THRESHOLD PULSEWIDTH: 0.4 ms
MDC IDC MSMT LEADCHNL RV SENSING INTR AMPL: 7.125 mV
MDC IDC SET LEADCHNL RV PACING PULSEWIDTH: 1 ms
MDC IDC STAT BRADY AP VS PERCENT: 4.6 %
MDC IDC STAT BRADY AS VP PERCENT: 60.71 %
MDC IDC STAT BRADY AS VS PERCENT: 0.22 %
MDC IDC STAT BRADY RA PERCENT PACED: 39.05 %

## 2018-01-30 ENCOUNTER — Encounter: Payer: Self-pay | Admitting: Cardiology

## 2018-03-10 ENCOUNTER — Ambulatory Visit: Payer: Medicare Other | Admitting: Cardiology

## 2018-03-26 NOTE — Progress Notes (Signed)
Cardiology Office Note  Date: 03/30/2018   ID: Jonathan Lamb, DOB 05/21/1946, MRN [id number]  PCP: Manon Hilding, MD  Primary Cardiologist: Rozann Lesches, MD   Chief Complaint  Patient presents with  . Tachycardia-bradycardia syndrome    History of Present Illness: Jonathan Lamb is a 72 y.o. male last seen in November 2018.  He is here with his wife for a follow-up visit.  Overall doing well, he does not report any significant palpitations.  He does have intermittent dyspnea on exertion, particular with inclines.  Also states that he if he works outdoors and sweats a lot he does get lightheaded and has associated hypotension.  We have talked about holding his diuretics on days when he plans to be physically active.  Otherwise his blood pressure control has been good.  He continues to follow with Dr. Rayann Heman in the device clinic, Medtronic pacemaker in place for treatment of symptomatic Mobitz type II second-degree heart block.  I personally reviewed his ECG today which shows a ventricular paced rhythm with atrial sensing.  Current medications reviewed, he continues on Toprol-XL 25 mg daily.  Past Medical History:  Diagnosis Date  . Arthritis   . Benign prostatic hyperplasia   . Essential hypertension   . Hemorrhoid   . Hypercholesterolemia   . RBBB   . Second degree heart block    Medtronic pacemaker -Dr. Rayann Heman  . SVT (supraventricular tachycardia) (HCC)    Atrial tachycardia    Past Surgical History:  Procedure Laterality Date  . COLONOSCOPY N/A 03/24/2013   Procedure: COLONOSCOPY;  Surgeon: Rogene Houston, MD;  [facility];  Service: Endoscopy;  Laterality: N/A;  830  . LAMINECTOMY     Lumbar  . PACEMAKER IMPLANT N/A 04/25/2017   MDT Azure XT DR MRI implanted by Dr Rayann Heman for second degree AV block  . PROSTATE BIOPSY    . THYROID SURGERY  1999   L lobectomy, complicated by esophageal laceration requiring esophageal repair     Current Outpatient  Medications  Medication Sig Dispense Refill  . aspirin 81 MG tablet Take 81 mg by mouth every evening.     . finasteride (PROSCAR) 5 MG tablet Take 5 mg by mouth every evening.     . hydrochlorothiazide (HYDRODIURIL) 12.5 MG tablet Take 12.5 mg by mouth daily.    Marland Kitchen ibuprofen (ADVIL,MOTRIN) 200 MG tablet Take 400 mg by mouth daily as needed for headache.     . losartan (COZAAR) 100 MG tablet Take 1 tablet (100 mg total) by mouth daily. 90 tablet 3  . metoprolol succinate (TOPROL-XL) 25 MG 24 hr tablet Take 1 tablet (25 mg total) by mouth daily. 90 tablet 3  . Multiple Vitamin (MULTIVITAMIN) tablet Take 1 tablet by mouth every evening.     . multivitamin-lutein (OCUVITE-LUTEIN) CAPS capsule Take 1 capsule by mouth every evening.     . naproxen sodium (ALEVE) 220 MG tablet Take 220 mg by mouth daily as needed.    . Omega-3 Fatty Acids (FISH OIL) 1200 MG CAPS Take 1,200 mg by mouth every evening.    . simvastatin (ZOCOR) 20 MG tablet Take 20 mg by mouth every evening.     . tamsulosin (FLOMAX) 0.4 MG CAPS capsule Take 0.4 mg by mouth every evening.      No current facility-administered medications for this visit.    Allergies:  Patient has no known allergies.   Social History: The patient  reports that he has  never smoked. He has never used smokeless tobacco. He reports that he drinks alcohol. He reports that he does not use drugs.   ROS:  Please see the history of present illness. Otherwise, complete review of systems is positive for none.  All other systems are reviewed and negative.   Physical Exam: VS:  BP 108/69   Pulse 67   Ht 6\' 4"  (1.93 m)   Wt 282 lb 12.8 oz (128.3 kg)   BMI 34.42 kg/m , BMI Body mass index is 34.42 kg/m.  Wt Readings from Last 3 Encounters:  03/30/18 282 lb 12.8 oz (128.3 kg)  08/08/17 290 lb 12.8 oz (131.9 kg)  07/15/17 283 lb (128.4 kg)    General: Patient appears comfortable at rest. HEENT: Conjunctiva and lids normal, oropharynx clear. Neck: Supple,  no elevated JVP or carotid bruits, no thyromegaly. Lungs: Clear to auscultation, nonlabored breathing at rest. Cardiac: Regular rate and rhythm, no S3, soft systolic murmur. Abdomen: Soft, nontender, bowel sounds present. Extremities: No pitting edema, distal pulses 2+. Skin: Warm and dry. Musculoskeletal: No kyphosis. Neuropsychiatric: Alert and oriented x3, affect grossly appropriate.  ECG: I personally reviewed the tracing from 04/26/2017 which showed sinus rhythm with incomplete right bundle branch block.  Recent Labwork: 04/14/2017: BUN 16; Creatinine, Ser 1.01; Hemoglobin 13.5; Platelets 207; Potassium 4.3; Sodium 139     Component Value Date/Time   CHOL 147 03/16/2017 1300   TRIG 146 03/16/2017 1300   HDL 42 03/16/2017 1300   CHOLHDL 3.5 03/16/2017 1300   VLDL 29 03/16/2017 1300   LDLCALC 76 03/16/2017 1300    Other Studies Reviewed Today:  Echocardiogram 04/14/2017: Study Conclusions  - Left ventricle: The cavity size was normal. Wall thickness was normal. Systolic function was normal. The estimated ejection fraction was in the range of 55% to 60%. - Mitral valve: Calcified annulus. Mildly thickened leaflets . - Left atrium: The atrium was mildly dilated. - Atrial septum: No defect or patent foramen ovale was identified.  Assessment and Plan:  1.  History of symptomatic second-degree type II heart block, status post Medtronic pacemaker.  He continues to follow-up with Dr. Rayann Heman.  ECG reviewed.  2.  Paroxysmal atrial tachycardia.  Continues on Toprol-XL 25 mg daily without significant palpitations.  3.  Essential hypertension.  He does have episodes of hypotension exacerbated by heat and sweating.  We have discussed hydration, also holding HCTZ intermittently.  Current medicines were reviewed with the patient today.   Orders Placed This Encounter  Procedures  . EKG 12-Lead    Disposition: Follow-up in 6 months.  Signed, Satira Sark, MD,  [facility] 03/30/2018 3:39 PM    [facility] [place], [place], [place] [address] Phone: [phone]; Fax: [phone]

## 2018-03-30 ENCOUNTER — Encounter: Payer: Self-pay | Admitting: Cardiology

## 2018-03-30 ENCOUNTER — Ambulatory Visit: Payer: Medicare Other | Admitting: Cardiology

## 2018-03-30 VITALS — BP 108/69 | HR 67 | Ht 76.0 in | Wt 282.8 lb

## 2018-03-30 DIAGNOSIS — I10 Essential (primary) hypertension: Secondary | ICD-10-CM | POA: Diagnosis not present

## 2018-03-30 DIAGNOSIS — I441 Atrioventricular block, second degree: Secondary | ICD-10-CM | POA: Diagnosis not present

## 2018-03-30 DIAGNOSIS — I471 Supraventricular tachycardia: Secondary | ICD-10-CM | POA: Diagnosis not present

## 2018-03-30 MED ORDER — METOPROLOL SUCCINATE ER 25 MG PO TB24: 25.0000 mg | ORAL_TABLET | Freq: Every day | ORAL | 1 refills | Status: DC

## 2018-03-30 NOTE — Patient Instructions (Signed)
Medication Instructions:  Your physician recommends that you continue on your current medications as directed. Please refer to the Current Medication list given to you today.  Labwork: NONE  Testing/Procedures: NONE  Follow-Up: Your physician wants you to follow-up in: [place]. You will receive a reminder letter in the mail two months in advance. If you don't receive a letter, please call our office to schedule the follow-up appointment.  Any Other Special Instructions Will Be Listed Below (If Applicable).  If you need a refill on your cardiac medications before your next appointment, please call your pharmacy.

## 2018-04-01 ENCOUNTER — Encounter (INDEPENDENT_AMBULATORY_CARE_PROVIDER_SITE_OTHER): Payer: Self-pay | Admitting: *Deleted

## 2018-04-13 ENCOUNTER — Other Ambulatory Visit: Payer: Self-pay | Admitting: Dermatology

## 2018-04-28 ENCOUNTER — Ambulatory Visit (INDEPENDENT_AMBULATORY_CARE_PROVIDER_SITE_OTHER): Payer: Medicare Other | Admitting: *Deleted

## 2018-04-28 DIAGNOSIS — I441 Atrioventricular block, second degree: Secondary | ICD-10-CM

## 2018-04-28 LAB — CUP PACEART REMOTE DEVICE CHECK
Brady Statistic AP VP Percent: 14.04 %
Brady Statistic AP VS Percent: 28.1 %
Brady Statistic AS VP Percent: 40.97 %
Brady Statistic RV Percent Paced: 55.01 %
Date Time Interrogation Session: 20190827053949
Implantable Lead Location: 753860
Implantable Pulse Generator Implant Date: 20180824
Lead Channel Impedance Value: 266 Ohm
Lead Channel Impedance Value: 304 Ohm
Lead Channel Impedance Value: 342 Ohm
Lead Channel Impedance Value: 361 Ohm
Lead Channel Sensing Intrinsic Amplitude: 4.75 mV
Lead Channel Sensing Intrinsic Amplitude: 4.75 mV
Lead Channel Setting Pacing Amplitude: 1.5 V
Lead Channel Setting Pacing Amplitude: 4.25 V
Lead Channel Setting Pacing Pulse Width: 1 ms
Lead Channel Setting Sensing Sensitivity: 0.9 mV
MDC IDC LEAD IMPLANT DT: 20180824
MDC IDC LEAD IMPLANT DT: 20180824
MDC IDC LEAD LOCATION: 753859
MDC IDC MSMT BATTERY REMAINING LONGEVITY: 51 mo
MDC IDC MSMT BATTERY VOLTAGE: 2.99 V
MDC IDC MSMT LEADCHNL RA PACING THRESHOLD AMPLITUDE: 0.5 V
MDC IDC MSMT LEADCHNL RA PACING THRESHOLD PULSEWIDTH: 0.4 ms
MDC IDC MSMT LEADCHNL RA SENSING INTR AMPL: 4 mV
MDC IDC MSMT LEADCHNL RA SENSING INTR AMPL: 4 mV
MDC IDC MSMT LEADCHNL RV PACING THRESHOLD AMPLITUDE: 2.5 V
MDC IDC MSMT LEADCHNL RV PACING THRESHOLD PULSEWIDTH: 0.4 ms
MDC IDC STAT BRADY AS VS PERCENT: 16.89 %
MDC IDC STAT BRADY RA PERCENT PACED: 42.14 %

## 2018-04-28 NOTE — Progress Notes (Signed)
Remote pacemaker transmission.   

## 2018-04-29 ENCOUNTER — Other Ambulatory Visit (INDEPENDENT_AMBULATORY_CARE_PROVIDER_SITE_OTHER): Payer: Self-pay | Admitting: *Deleted

## 2018-04-29 DIAGNOSIS — Z860100 Personal history of colon polyps, unspecified: Secondary | ICD-10-CM | POA: Insufficient documentation

## 2018-04-30 ENCOUNTER — Encounter: Payer: Self-pay | Admitting: Cardiology

## 2018-06-26 ENCOUNTER — Telehealth: Payer: Self-pay

## 2018-06-26 NOTE — Research (Signed)
[employer] [employer] Informed Consent   Subject Name: Jonathan Lamb  Subject met inclusion and exclusion criteria.  The informed consent form, study requirements and expectations were reviewed with the subject and questions and concerns were addressed prior to the signing of the consent form.  The subject verbalized understanding of the trail requirements.  The subject agreed to participate in the [facility] trial and signed the informed consent.  The informed consent was obtained prior to performance of any protocol-specific procedures for the subject.  A copy of the signed informed consent was given to the subject and a copy was placed in the subject's medical record.  [name] 06/26/2018, 1:00 PM

## 2018-06-26 NOTE — Telephone Encounter (Signed)
Called Mr. Drew. 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

## 2018-07-06 ENCOUNTER — Telehealth (INDEPENDENT_AMBULATORY_CARE_PROVIDER_SITE_OTHER): Payer: Self-pay | Admitting: *Deleted

## 2018-07-06 ENCOUNTER — Encounter (INDEPENDENT_AMBULATORY_CARE_PROVIDER_SITE_OTHER): Payer: Self-pay | Admitting: *Deleted

## 2018-07-06 MED ORDER — SUPREP BOWEL PREP KIT 17.5-3.13-1.6 GM/177ML PO SOLN: 1.0000 | Freq: Once | ORAL | 0 refills | Status: AC

## 2018-07-06 NOTE — Telephone Encounter (Signed)
Referring MD/PCP: sasser   Procedure: tcs  Reason/Indication:  Hx polyps  Has patient had this procedure before?  Tes, 03/2013  If so, when, by whom and where?    Is there a family history of colon cancer?  no  Who?  What age when diagnosed?    Is patient diabetic?   no      Does patient have prosthetic heart valve or mechanical valve?  no  Do you have a pacemaker?  no  Has patient ever had endocarditis? no  Has patient had joint replacement within last 12 months?  no  Is patient constipated or do they take laxatives? no  Does patient have a history of alcohol/drug use?  no  Is patient on blood thinner such as Coumadin, Plavix and/or Aspirin? yes  Medications: asa 81 mg daily, finasteride 5 mg daily, fish oil 1200 mg daily, hctz 12.5 mg daily, ibuprofen 200 mg prn, losartan 100 mg daily, metoprolol 25 mg daily, mvi daily, naproxen 220 mg daily, simvastatin 20 mg daily, tamsulosin 0.4 mg daily  Allergies: nkda  Medication Adjustment per Dr Lindi Adie, NP: asa 2 days  Procedure date & time: 08/05/18 at 930

## 2018-07-06 NOTE — Telephone Encounter (Signed)
Patient needs suprep 

## 2018-07-06 NOTE — Telephone Encounter (Signed)
agree

## 2018-07-28 ENCOUNTER — Ambulatory Visit (INDEPENDENT_AMBULATORY_CARE_PROVIDER_SITE_OTHER): Payer: Medicare Other

## 2018-07-28 DIAGNOSIS — I441 Atrioventricular block, second degree: Secondary | ICD-10-CM | POA: Diagnosis not present

## 2018-07-28 NOTE — Progress Notes (Signed)
Remote pacemaker transmission.   

## 2018-08-03 ENCOUNTER — Encounter: Payer: Self-pay | Admitting: Internal Medicine

## 2018-08-03 ENCOUNTER — Telehealth (INDEPENDENT_AMBULATORY_CARE_PROVIDER_SITE_OTHER): Payer: Self-pay | Admitting: Internal Medicine

## 2018-08-03 NOTE — Telephone Encounter (Signed)
Wife called stated patient is to have a colonoscopy on 08-05-18 and wanted to make sure Dr Laural Golden was aware that patient has a Psychologist, forensic.

## 2018-08-05 ENCOUNTER — Ambulatory Visit (HOSPITAL_COMMUNITY)
Admission: RE | Admit: 2018-08-05 | Discharge: 2018-08-05 | Disposition: A | Discharge status: Home / Self Care | Payer: Medicare Other | Source: Ambulatory Visit | Attending: Internal Medicine | Admitting: Internal Medicine

## 2018-08-05 ENCOUNTER — Other Ambulatory Visit: Payer: Self-pay

## 2018-08-05 ENCOUNTER — Encounter (HOSPITAL_COMMUNITY)
Admission: RE | Disposition: A | Discharge status: Home / Self Care | Payer: Self-pay | Source: Ambulatory Visit | Attending: Internal Medicine

## 2018-08-05 ENCOUNTER — Encounter (HOSPITAL_COMMUNITY): Payer: Self-pay | Admitting: *Deleted

## 2018-08-05 DIAGNOSIS — D125 Benign neoplasm of sigmoid colon: Secondary | ICD-10-CM | POA: Insufficient documentation

## 2018-08-05 DIAGNOSIS — Z950 Presence of cardiac pacemaker: Secondary | ICD-10-CM | POA: Insufficient documentation

## 2018-08-05 DIAGNOSIS — Z1211 Encounter for screening for malignant neoplasm of colon: Secondary | ICD-10-CM | POA: Diagnosis not present

## 2018-08-05 DIAGNOSIS — D123 Benign neoplasm of transverse colon: Secondary | ICD-10-CM | POA: Insufficient documentation

## 2018-08-05 DIAGNOSIS — Z860100 Personal history of colon polyps, unspecified: Secondary | ICD-10-CM | POA: Insufficient documentation

## 2018-08-05 DIAGNOSIS — Z09 Encounter for follow-up examination after completed treatment for conditions other than malignant neoplasm: Secondary | ICD-10-CM | POA: Diagnosis not present

## 2018-08-05 DIAGNOSIS — I10 Essential (primary) hypertension: Secondary | ICD-10-CM | POA: Insufficient documentation

## 2018-08-05 DIAGNOSIS — Z79899 Other long term (current) drug therapy: Secondary | ICD-10-CM | POA: Insufficient documentation

## 2018-08-05 DIAGNOSIS — Z7982 Long term (current) use of aspirin: Secondary | ICD-10-CM | POA: Diagnosis not present

## 2018-08-05 DIAGNOSIS — D122 Benign neoplasm of ascending colon: Secondary | ICD-10-CM | POA: Insufficient documentation

## 2018-08-05 DIAGNOSIS — E7800 Pure hypercholesterolemia, unspecified: Secondary | ICD-10-CM | POA: Insufficient documentation

## 2018-08-05 DIAGNOSIS — N400 Enlarged prostate without lower urinary tract symptoms: Secondary | ICD-10-CM | POA: Diagnosis not present

## 2018-08-05 HISTORY — PX: POLYPECTOMY: SHX5525

## 2018-08-05 HISTORY — PX: COLONOSCOPY: SHX5424

## 2018-08-05 SURGERY — COLONOSCOPY
Anesthesia: Moderate Sedation

## 2018-08-05 MED ORDER — MEPERIDINE HCL 50 MG/ML IJ SOLN
INTRAMUSCULAR | Status: DC | PRN
  Administered 2018-08-05 (×2): 25 mg via INTRAVENOUS

## 2018-08-05 MED ORDER — MIDAZOLAM HCL 5 MG/5ML IJ SOLN
INTRAMUSCULAR | Status: AC
  Filled 2018-08-05: qty 10

## 2018-08-05 MED ORDER — STERILE WATER FOR IRRIGATION IR SOLN
Status: DC | PRN
  Administered 2018-08-05: 100 mL

## 2018-08-05 MED ORDER — MEPERIDINE HCL 50 MG/ML IJ SOLN
INTRAMUSCULAR | Status: AC
  Filled 2018-08-05: qty 1

## 2018-08-05 MED ORDER — MIDAZOLAM HCL 5 MG/5ML IJ SOLN
INTRAMUSCULAR | Status: DC | PRN
  Administered 2018-08-05 (×3): 2 mg via INTRAVENOUS

## 2018-08-05 MED ORDER — SODIUM CHLORIDE 0.9 % IV SOLN
INTRAVENOUS | Status: DC
  Administered 2018-08-05: 09:00:00 via INTRAVENOUS

## 2018-08-05 NOTE — H&P (Signed)
Jonathan Lamb is an 72 y.o. male.   Chief Complaint: Patient is here for colonoscopy. HPI: Patient 72 year old Caucasian male who is here for surveillance colonoscopy.  He has a history of colonic adenomas.  Last exam was in July 2014.  He had 6 small polyps removed and at least 2 maybe 3 these were adenomas.  He denies abdominal pain change in bowel habits or rectal bleeding. Family history is negative for CRC. Last aspirin dose was 3 days ago.  Past Medical History:  Diagnosis Date  . Arthritis   . Benign prostatic hyperplasia   . Essential hypertension   . Hemorrhoid   . Hypercholesterolemia   . RBBB   . Second degree heart block    Medtronic pacemaker -Dr. Rayann Heman  . SVT (supraventricular tachycardia) (HCC)    Atrial tachycardia    Past Surgical History:  Procedure Laterality Date  . COLONOSCOPY N/A 03/24/2013   Procedure: COLONOSCOPY;  Surgeon: Rogene Houston, MD;  [facility];  Service: Endoscopy;  Laterality: N/A;  830  . LAMINECTOMY     Lumbar  . PACEMAKER IMPLANT N/A 04/25/2017   MDT Azure XT DR MRI implanted by Dr Rayann Heman for second degree AV block  . PROSTATE BIOPSY    . THYROID SURGERY  1999   L lobectomy, complicated by esophageal laceration requiring esophageal repair     Family History  Problem Relation Age of Onset  . Hypertension Father   . Heart attack Father 80  . Pelvic inflammatory disease Father   . Hypertension Mother   . Stroke Mother   . Deep vein thrombosis Mother   . Arthritis Brother   . Pancreatic cancer Paternal Grandfather   . Heart attack Maternal Grandfather 56   Social History:  reports that he has never smoked. He has never used smokeless tobacco. He reports that he drinks alcohol. He reports that he does not use drugs.  Allergies: No Known Allergies  Medications Prior to Admission  Medication Sig Dispense Refill  . aspirin 81 MG tablet Take 81 mg by mouth every evening.     . Carboxymethylcellul-Glycerin (LUBRICATING  EYE DROPS OP) Place 1 drop into both eyes daily as needed (dry eyes).    . finasteride (PROSCAR) 5 MG tablet Take 5 mg by mouth every evening.     . hydrochlorothiazide (HYDRODIURIL) 12.5 MG tablet Take 12.5 mg by mouth daily.    Marland Kitchen losartan (COZAAR) 50 MG tablet Take 50 mg by mouth every evening.    . metoprolol succinate (TOPROL-XL) 25 MG 24 hr tablet Take 1 tablet (25 mg total) by mouth daily. 90 tablet 1  . Multiple Vitamin (MULTIVITAMIN) tablet Take 1 tablet by mouth every evening.     . multivitamin-lutein (OCUVITE-LUTEIN) CAPS capsule Take 1 capsule by mouth every evening.     . naproxen sodium (ALEVE) 220 MG tablet Take 440 mg by mouth daily as needed (pain).     . Omega-3 Fatty Acids (FISH OIL) 1200 MG CAPS Take 1,200 mg by mouth every evening.    . simvastatin (ZOCOR) 20 MG tablet Take 20 mg by mouth every evening.     . tamsulosin (FLOMAX) 0.4 MG CAPS capsule Take 0.4 mg by mouth every evening.     Marland Kitchen losartan (COZAAR) 100 MG tablet Take 1 tablet (100 mg total) by mouth daily. (Patient not taking: Reported on 07/28/2018) 90 tablet 3    No results found for this or any previous visit (from the past 48 hour(s)).  No results found.  ROS  Blood pressure (!) 156/84, pulse 60, temperature 97.8 F (36.6 C), temperature source Oral, resp. rate 15, height 6\' 4"  (1.93 m), weight 126.1 kg, SpO2 99 %. Physical Exam  Constitutional: He appears well-developed and well-nourished.  HENT:  Mouth/Throat: Oropharynx is clear and moist.  Eyes: Conjunctivae are normal. No scleral icterus.  Neck: No thyromegaly present.  Cardiovascular: Normal rate, regular rhythm and normal heart sounds.  No murmur heard. Respiratory: Effort normal and breath sounds normal.  Patient has pacemaker in left pectoral region.  GI:  Abdomen is full but soft and nontender with organomegaly or masses.  Musculoskeletal: He exhibits no edema.  Lymphadenopathy:    He has no cervical adenopathy.  Neurological: He is  alert.  Skin: Skin is warm and dry.     Assessment/Plan History of colonic adenomas. Surveillance colonoscopy.  Hildred Laser, MD 08/05/2018, 9:06 AM

## 2018-08-05 NOTE — Discharge Instructions (Signed)
Colonoscopy, Adult, Care After This sheet gives you information about how to care for yourself after your procedure. Your health care provider may also give you more specific instructions. If you have problems or questions, contact your health care provider. What can I expect after the procedure? After the procedure, it is common to have:  A small amount of blood in your stool for 24 hours after the procedure.  Some gas.  Mild abdominal cramping or bloating.  Follow these instructions at home: General instructions   For the first 24 hours after the procedure: ? Do not drive or use machinery. ? Do not sign important documents. ? Do not drink alcohol. ? Do your regular daily activities at a slower pace than normal. ? Eat soft, easy-to-digest foods. ? Rest often.  Take over-the-counter or prescription medicines only as told by your health care provider.  It is up to you to get the results of your procedure. Ask your health care provider, or the department performing the procedure, when your results will be ready. Relieving cramping and bloating  Try walking around when you have cramps or feel bloated.  Apply heat to your abdomen as told by your health care provider. Use a heat source that your health care provider recommends, such as a moist heat pack or a heating pad. ? Place a towel between your skin and the heat source. ? Leave the heat on for 20-30 minutes. ? Remove the heat if your skin turns bright red. This is especially important if you are unable to feel pain, heat, or cold. You may have a greater risk of getting burned. Eating and drinking  Drink enough fluid to keep your urine clear or pale yellow.  Resume your normal diet as instructed by your health care provider. Avoid heavy or fried foods that are hard to digest.  Avoid drinking alcohol for as long as instructed by your health care provider. Contact a health care provider if:  You have blood in your stool 2-3  days after the procedure. Get help right away if:  You have more than a small spotting of blood in your stool.  You pass large blood clots in your stool.  Your abdomen is swollen.  You have nausea or vomiting.  You have a fever.  You have increasing abdominal pain that is not relieved with medicine. This information is not intended to replace advice given to you by your health care provider. Make sure you discuss any questions you have with your health care provider. Document Released: 04/02/2004 Document Revised: 05/13/2016 Document Reviewed: 10/31/2015 Elsevier Interactive Patient Education  2018 [employer]. Colon Polyps Polyps are tissue growths inside the body. Polyps can grow in many places, including the large intestine (colon). A polyp may be a round bump or a mushroom-shaped growth. You could have one polyp or several. Most colon polyps are noncancerous (benign). However, some colon polyps can become cancerous over time. What are the causes? The exact cause of colon polyps is not known. What increases the risk? This condition is more likely to develop in people who:  Have a family history of colon cancer or colon polyps.  Are older than 65 or older than 45 if they are African American.  Have inflammatory bowel disease, such as ulcerative colitis or Crohn disease.  Are overweight.  Smoke cigarettes.  Do not get enough exercise.  Drink too much alcohol.  Eat a diet that is: ? High in fat and red meat. ? Low in  fiber.  Had childhood cancer that was treated with abdominal radiation.  What are the signs or symptoms? Most polyps do not cause symptoms. If you have symptoms, they may include:  Blood coming from your rectum when having a bowel movement.  Blood in your stool.The stool may look dark red or black.  A change in bowel habits, such as constipation or diarrhea.  How is this diagnosed? This condition is diagnosed with a colonoscopy. This is a  procedure that uses a lighted, flexible scope to look at the inside of your colon. How is this treated? Treatment for this condition involves removing any polyps that are found. Those polyps will then be tested for cancer. If cancer is found, your health care provider will talk to you about options for colon cancer treatment. Follow these instructions at home: Diet  Eat plenty of fiber, such as fruits, vegetables, and whole grains.  Eat foods that are high in calcium and vitamin D, such as milk, cheese, yogurt, eggs, liver, fish, and broccoli.  Limit foods high in fat, red meats, and processed meats, such as hot dogs, sausage, bacon, and lunch meats.  Maintain a healthy weight, or lose weight if recommended by your health care provider. General instructions  Do not smoke cigarettes.  Do not drink alcohol excessively.  Keep all follow-up visits as told by your health care provider. This is important. This includes keeping regularly scheduled colonoscopies. Talk to your health care provider about when you need a colonoscopy.  Exercise every day or as told by your health care provider. Contact a health care provider if:  You have new or worsening bleeding during a bowel movement.  You have new or increased blood in your stool.  You have a change in bowel habits.  You unexpectedly lose weight. This information is not intended to replace advice given to you by your health care provider. Make sure you discuss any questions you have with your health care provider. Document Released: 05/15/2004 Document Revised: 01/25/2016 Document Reviewed: 07/10/2015 Elsevier Interactive Patient Education  2018 [employer]. Resume aspirin on 08/06/2018. Resume other medications as before. Resume usual diet. No driving for 24 hours. Physician will call with biopsy results.

## 2018-08-06 NOTE — Op Note (Signed)
[facility] Patient Name: Jonathan Lamb Procedure Date: 08/05/2018 9:01 AM MRN: [id number] Date of Birth: [birth year] Attending MD: Hildred Laser , MD CSN: [id number] Age: 72 Admit Type: Outpatient Procedure:                Colonoscopy Indications:              High risk colon cancer surveillance: Personal                            history of colonic polyps Providers:                Hildred Laser, MD, Rosina Lowenstein, RN, Nelma Rothman,                            Technician Referring MD:             Manon Hilding, MD Medicines:                Meperidine 50 mg IV, Midazolam 6 mg IV Complications:            No immediate complications. Estimated Blood Loss:     Estimated blood loss was minimal. Procedure:                Pre-Anesthesia Assessment:                           - Prior to the procedure, a History and Physical                            was performed, and patient medications and                            allergies were reviewed. The patient's tolerance of                            previous anesthesia was also reviewed. The risks                            and benefits of the procedure and the sedation                            options and risks were discussed with the patient.                            All questions were answered, and informed consent                            was obtained. Prior Anticoagulants: The patient                            last took aspirin 3 days prior to the procedure.                            ASA Grade Assessment: III - A patient with severe  systemic disease. After reviewing the risks and                            benefits, the patient was deemed in satisfactory                            condition to undergo the procedure.                           After obtaining informed consent, the colonoscope                            was passed under direct vision. Throughout the                            procedure, the  patient's blood pressure, pulse, and                            oxygen saturations were monitored continuously. The                            [id number]-[id number] ([id number]) scope was introduced through                            the anus and advanced to the the cecum, identified                            by appendiceal orifice and ileocecal valve. The                            colonoscopy was performed without difficulty. The                            patient tolerated the procedure well. The quality                            of the bowel preparation was good. The ileocecal                            valve, appendiceal orifice, and rectum were                            photographed. Scope In: 9:17:11 AM Scope Out: 9:44:16 AM Scope Withdrawal Time: 0 hours 21 minutes 15 seconds  Total Procedure Duration: 0 hours 27 minutes 5 seconds  Findings:      The perianal and digital rectal examinations were normal.      Three sessile polyps were found in the sigmoid colon, splenic flexure       and ascending colon. The polyps were 4 to 6 mm in size. These polyps       were removed with a cold snare. Resection and retrieval were complete.       The pathology specimen was placed into Bottle Number 1.      The exam was otherwise normal throughout the examined colon.  The retroflexed view of the distal rectum and anal verge was normal and       showed no anal or rectal abnormalities. Impression:               - Three 4 to 6 mm polyps in the sigmoid colon, at                            the splenic flexure and in the ascending colon,                            removed with a cold snare. Resected and retrieved. Moderate Sedation:      Moderate (conscious) sedation was administered by the endoscopy nurse       and supervised by the endoscopist. The following parameters were       monitored: oxygen saturation, heart rate, blood pressure, CO2       capnography and response to care. Total physician  intraservice time was       34 minutes. Recommendation:           - Patient has a contact number available for                            emergencies. The signs and symptoms of potential                            delayed complications were discussed with the                            patient. Return to normal activities tomorrow.                            Written discharge instructions were provided to the                            patient.                           - Resume previous diet today.                           - Continue present medications.                           - No aspirin, ibuprofen, naproxen, or other                            non-steroidal anti-inflammatory drugs for 1 day.                           - Await pathology results.                           - Repeat colonoscopy in 5 years for surveillance. Procedure Code(s):        --- Professional ---  [phone], Colonoscopy, flexible; with removal of                            tumor(s), polyp(s), or other lesion(s) by snare                            technique                           99153, Moderate sedation; each additional 15                            minutes intraservice time                           G0500, Moderate sedation services provided by the                            same physician or other qualified health care                            professional performing a gastrointestinal                            endoscopic service that sedation supports,                            requiring the presence of an independent trained                            observer to assist in the monitoring of the                            patient's level of consciousness and physiological                            status; initial 15 minutes of intra-service time;                            patient age 9 years or older (additional time may                            be reported with [phone], as  appropriate) Diagnosis Code(s):        --- Professional ---                           Z86.010, Personal history of colonic polyps                           D12.5, Benign neoplasm of sigmoid colon                           D12.3, Benign neoplasm of transverse colon (hepatic  flexure or splenic flexure)                           D12.2, Benign neoplasm of ascending colon CPT copyright 2018 American Medical Association. All rights reserved. The codes documented in this report are preliminary and upon coder review may  be revised to meet current compliance requirements. Hildred Laser, MD Hildred Laser, MD 08/05/2018 9:55:37 AM This report has been signed electronically. Number of Addenda: 0

## 2018-08-10 ENCOUNTER — Ambulatory Visit: Payer: Medicare Other | Admitting: Internal Medicine

## 2018-08-10 ENCOUNTER — Encounter: Payer: Self-pay | Admitting: Internal Medicine

## 2018-08-10 VITALS — BP 154/98 | HR 60 | Ht 76.0 in | Wt 286.0 lb

## 2018-08-10 DIAGNOSIS — I441 Atrioventricular block, second degree: Secondary | ICD-10-CM

## 2018-08-10 DIAGNOSIS — I4430 Unspecified atrioventricular block: Secondary | ICD-10-CM

## 2018-08-10 DIAGNOSIS — I10 Essential (primary) hypertension: Secondary | ICD-10-CM | POA: Diagnosis not present

## 2018-08-10 NOTE — Progress Notes (Signed)
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 male who presents today for routine electrophysiology followup.  Since last being seen in our clinic, the patient reports doing very well. He and his spouse recently toured [place] and [place]. Today, he denies symptoms of palpitations, chest pain, shortness of breath,  lower extremity edema, dizziness, presyncope, or syncope.  The patient is otherwise without complaint today.   Past Medical History:  Diagnosis Date  . Arthritis   . Benign prostatic hyperplasia   . Essential hypertension   . Hemorrhoid   . Hypercholesterolemia   . RBBB   . Second degree heart block    Medtronic pacemaker -Dr. Rayann Heman  . SVT (supraventricular tachycardia) (HCC)    Atrial tachycardia   Past Surgical History:  Procedure Laterality Date  . COLONOSCOPY N/A 03/24/2013   Procedure: COLONOSCOPY;  Surgeon: Rogene Houston, MD;  Location[facility];  Service: Endoscopy;  Laterality: N/A;  830  . LAMINECTOMY     Lumbar  . PACEMAKER IMPLANT N/A 04/25/2017   MDT Azure XT DR MRI implanted by Dr Rayann Heman for second degree AV block  . PROSTATE BIOPSY    . THYROID SURGERY  1999   L lobectomy, complicated by esophageal laceration requiring esophageal repair     ROS- all systems are reviewed and negative except as per HPI above  Current Outpatient Medications  Medication Sig Dispense Refill  . aspirin 81 MG tablet Take 1 tablet (81 mg total) by mouth every evening. 30 tablet   . Carboxymethylcellul-Glycerin (LUBRICATING EYE DROPS OP) Place 1 drop into both eyes daily as needed (dry eyes).    . finasteride (PROSCAR) 5 MG tablet Take 5 mg by mouth every evening.     . hydrochlorothiazide (HYDRODIURIL) 12.5 MG tablet Take 12.5 mg by mouth daily.    Marland Kitchen losartan (COZAAR) 50 MG tablet Take 50 mg by mouth every evening.    . metoprolol succinate (TOPROL-XL) 25 MG 24 hr tablet Take 1 tablet (25 mg total) by  mouth daily. 90 tablet 1  . Multiple Vitamin (MULTIVITAMIN) tablet Take 1 tablet by mouth every evening.     . multivitamin-lutein (OCUVITE-LUTEIN) CAPS capsule Take 1 capsule by mouth every evening.     . naproxen sodium (ALEVE) 220 MG tablet Take 440 mg by mouth daily as needed (pain).     . Omega-3 Fatty Acids (FISH OIL) 1200 MG CAPS Take 1,200 mg by mouth every evening.    . simvastatin (ZOCOR) 20 MG tablet Take 20 mg by mouth every evening.     . tamsulosin (FLOMAX) 0.4 MG CAPS capsule Take 0.4 mg by mouth every evening.      No current facility-administered medications for this visit.     Physical Exam: Vitals:   08/10/18 1022  BP: (!) 154/98  Pulse: 60  SpO2: 98%  Weight: 286 lb (129.7 kg)  Height: 6\' 4"  (1.93 m)    GEN- The patient is well appearing, alert and oriented x 3 today.   Head- normocephalic, atraumatic Eyes-  Sclera clear, conjunctiva pink Ears- hearing intact Oropharynx- clear Lungs- Clear to ausculation bilaterally, normal work of breathing Chest- pacemaker pocket is well healed Heart- Regular rate and rhythm, no murmurs, rubs or gallops, PMI not laterally displaced GI- soft, NT, ND, + BS Extremities- no clubbing, cyanosis, or edema  Pacemaker interrogation- reviewed in detail today,  See PACEART report  ekg tracing  ordered today is personally reviewed and shows AV paced  Assessment and Plan:  1. Symptomatic second degree heart block Normal pacemaker function See Pace Art report RV threshold is chronically elevated.  Today, threshold is 1.5V @ 1 msec.  I will therefore program at 3V @1  msec to preserve battery  2. HTN Elevated today He reports good control at home  [facility] Return to see EP NP in a year  Thompson Grayer MD, [facility] 08/10/2018 10:54 AM

## 2018-08-10 NOTE — Patient Instructions (Signed)
Medication Instructions:  Your physician recommends that you continue on your current medications as directed. Please refer to the Current Medication list given to you today.  Labwork: None ordered.  Testing/Procedures: None ordered.  Follow-Up: Your physician recommends that you schedule a follow-up appointment in:   12 months with Chanetta Marshall, NP  Remote monitoring is used to monitor your Pacemaker from home. This monitoring reduces the number of office visits required to check your device to one time per year. It allows [place] to keep an eye on the functioning of your device to ensure it is working properly. You are scheduled for a device check from home on 2/25. You may send your transmission at any time that day. If you have a wireless device, the transmission will be sent automatically. After your physician reviews your transmission, you will receive a postcard with your next transmission date.    Any Other Special Instructions Will Be Listed Below (If Applicable).     If you need a refill on your cardiac medications before your next appointment, please call your pharmacy.

## 2018-08-11 ENCOUNTER — Encounter (HOSPITAL_COMMUNITY): Payer: Self-pay | Admitting: Internal Medicine

## 2018-08-21 LAB — CUP PACEART INCLINIC DEVICE CHECK
Date Time Interrogation Session: 20191220141428
Implantable Lead Implant Date: 20180824
Implantable Lead Implant Date: 20180824
Implantable Lead Location: 753859
Implantable Lead Location: 753860
Implantable Lead Model: 5076
Implantable Lead Model: 5076
Implantable Pulse Generator Implant Date: 20180824

## 2018-09-18 ENCOUNTER — Other Ambulatory Visit: Payer: Self-pay | Admitting: Internal Medicine

## 2018-09-18 LAB — CUP PACEART REMOTE DEVICE CHECK
Battery Remaining Longevity: 47 mo
Battery Voltage: 2.95 V
Brady Statistic AP VP Percent: 25.88 %
Brady Statistic AP VS Percent: 9.49 %
Brady Statistic AS VP Percent: 55.09 %
Brady Statistic AS VS Percent: 9.55 %
Brady Statistic RA Percent Paced: 35.36 %
Date Time Interrogation Session: 20191126070934
Implantable Lead Implant Date: 20180824
Implantable Lead Implant Date: 20180824
Implantable Lead Location: 753859
Implantable Lead Location: 753860
Implantable Lead Model: 5076
Implantable Lead Model: 5076
Implantable Pulse Generator Implant Date: 20180824
Lead Channel Impedance Value: 304 Ohm
Lead Channel Impedance Value: 323 Ohm
Lead Channel Impedance Value: 361 Ohm
Lead Channel Impedance Value: 361 Ohm
Lead Channel Pacing Threshold Amplitude: 0.5 V
Lead Channel Pacing Threshold Amplitude: 2 V
Lead Channel Pacing Threshold Pulse Width: 0.4 ms
Lead Channel Sensing Intrinsic Amplitude: 19.375 mV
Lead Channel Sensing Intrinsic Amplitude: 19.375 mV
Lead Channel Sensing Intrinsic Amplitude: 4.125 mV
Lead Channel Sensing Intrinsic Amplitude: 4.125 mV
Lead Channel Setting Pacing Amplitude: 1.5 V
Lead Channel Setting Pacing Amplitude: 4.25 V
Lead Channel Setting Pacing Pulse Width: 1 ms
MDC IDC MSMT LEADCHNL RA PACING THRESHOLD PULSEWIDTH: 0.4 ms
MDC IDC SET LEADCHNL RV SENSING SENSITIVITY: 0.9 mV
MDC IDC STAT BRADY RV PERCENT PACED: 80.97 %

## 2018-09-24 ENCOUNTER — Telehealth: Payer: Self-pay

## 2018-09-24 MED ORDER — LOSARTAN POTASSIUM 100 MG PO TABS: 100.0000 mg | ORAL_TABLET | Freq: Every day | ORAL | 2 refills | Status: DC

## 2018-09-24 NOTE — Telephone Encounter (Signed)
Pt has been taking 100 mg of losartan for the last 90 days - Ok to refill at that dose?

## 2018-09-24 NOTE — Telephone Encounter (Signed)
Says Dr Quintin Alto has not made any medication changes - he was seen in July 2019 with Dr Domenic Polite and medication list indicated 100 mg at that time - do not see any notes indicating that we made a decrease

## 2018-09-24 NOTE — Telephone Encounter (Signed)
Patient followed in our [place] office.  Chart indicates Cozaar 50 mg daily, would please clarify this with the patient.  Actually, he follows with Dr. Quintin Alto as PCP, they may have adjusted the dose.

## 2018-09-24 NOTE — Addendum Note (Signed)
Addended by: Merlene Laughter on: 09/24/2018 12:33 PM   Modules accepted: Orders

## 2018-09-24 NOTE — Telephone Encounter (Signed)
Pt called stating he needs a refill on Losartan 100 mg. Pt chart list Losartan 50 mg. I do not see where Dr. Rayann Heman has ever prescribed this medication for the pt. Please address. Thank you.

## 2018-09-24 NOTE — Telephone Encounter (Signed)
Patient confirmed that he is taking losartan 100 mg daily for the past 90 days. Refill sent to pharmacy per patient request.

## 2018-09-25 NOTE — Progress Notes (Signed)
Cardiology Office Note  Date: 09/28/2018   Jonathan Lamb, DOB Apr 23, 1946, MRN [id number]  PCP: Manon Hilding, MD  Primary Cardiologist: Rozann Lesches, MD   Chief Complaint  Patient presents with  . Cardiac follow-up    History of Present Illness: Jonathan Lamb is a 73 y.o. male last seen in July 2019.  He is here with his wife for a follow-up visit.  He reports no change in stamina, no progressive dyspnea on exertion, and no significant palpitations or chest pain.  They did travel to [place] since our last visit.  He sees Dr. 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  I reviewed his medications which are stable from a cardiac perspective.  He continues on Toprol-XL at 25 mg daily.  Cozaar was recently refilled in the 100 mg daily.  His blood pressure is very well controlled.  He is on HCTZ, no active potassium supplement.  He reports having lab work done recently with Dr. Quintin Alto.  Past Medical History:  Diagnosis Date  . Arthritis   . Benign prostatic hyperplasia   . Essential hypertension   . Hemorrhoid   . Hypercholesterolemia   . RBBB   . Second degree heart block    Medtronic pacemaker -Dr. Rayann Heman  . SVT (supraventricular tachycardia) (HCC)    Atrial tachycardia    Past Surgical History:  Procedure Laterality Date  . COLONOSCOPY N/A 03/24/2013   Procedure: COLONOSCOPY;  Surgeon: Rogene Houston, MD;  [facility];  Service: Endoscopy;  Laterality: N/A;  830  . COLONOSCOPY N/A 08/05/2018   Procedure: COLONOSCOPY;  Surgeon: Rogene Houston, MD;  [facility];  Service: Endoscopy;  Laterality: N/A;  12  . LAMINECTOMY     Lumbar  . PACEMAKER IMPLANT N/A 04/25/2017   MDT Azure XT DR MRI implanted by Dr Rayann Heman for second degree AV block  . POLYPECTOMY  08/05/2018   Procedure: POLYPECTOMY;  Surgeon: Rogene Houston, MD;  [facility];  Service:  Endoscopy;;  ascending polyp, splenic flexure polyp, sigmoid polyp  . PROSTATE BIOPSY    . THYROID SURGERY  1999   L lobectomy, complicated by esophageal laceration requiring esophageal repair     Current Outpatient Medications  Medication Sig Dispense Refill  . aspirin 81 MG tablet Take 1 tablet (81 mg total) by mouth every evening. 30 tablet   . Carboxymethylcellul-Glycerin (LUBRICATING EYE DROPS OP) Place 1 drop into both eyes daily as needed (dry eyes).    . finasteride (PROSCAR) 5 MG tablet Take 5 mg by mouth every evening.     . hydrochlorothiazide (HYDRODIURIL) 12.5 MG tablet Take 12.5 mg by mouth daily.    Marland Kitchen losartan (COZAAR) 100 MG tablet Take 1 tablet (100 mg total) by mouth daily. 90 tablet 2  . metoprolol succinate (TOPROL-XL) 25 MG 24 hr tablet Take 1 tablet (25 mg total) by mouth daily. 90 tablet 2  . Multiple Vitamin (MULTIVITAMIN) tablet Take 1 tablet by mouth every evening.     . multivitamin-lutein (OCUVITE-LUTEIN) CAPS capsule Take 1 capsule by mouth every evening.     . naproxen sodium (ALEVE) 220 MG tablet Take 440 mg by mouth daily as needed (pain).     . Omega-3 Fatty Acids (FISH OIL) 1200 MG CAPS Take 1,200 mg by mouth every evening.    . simvastatin (ZOCOR) 20 MG tablet Take 20 mg by  mouth every evening.     . tamsulosin (FLOMAX) 0.4 MG CAPS capsule Take 0.4 mg by mouth every evening.      No current facility-administered medications for this visit.    Allergies:  Patient has no known allergies.   Social History: The patient  reports that he has never smoked. He has never used smokeless tobacco. He reports current alcohol use. He reports that he does not use drugs.   ROS:  Please see the history of present illness. Otherwise, complete review of systems is positive for none.  All other systems are reviewed and negative.   Physical Exam: VS:  BP 117/71   Pulse 67   Ht 6\' 4"  (1.93 m)   Wt 278 lb (126.1 kg)   SpO2 99%   BMI 33.84 kg/m , BMI Body mass index is  33.84 kg/m.  Wt Readings from Last 3 Encounters:  09/28/18 278 lb (126.1 kg)  08/10/18 286 lb (129.7 kg)  08/05/18 278 lb (126.1 kg)    General: Patient appears comfortable at rest. HEENT: Conjunctiva and lids normal, oropharynx clear. Neck: Supple, no elevated JVP or carotid bruits, no thyromegaly. Lungs: Clear to auscultation, nonlabored breathing at rest. Cardiac: Regular rate and rhythm, no S3, soft systolic murmur. Abdomen: Soft, nontender, bowel sounds present. Extremities: No pitting edema, distal pulses 2+. Skin: Warm and dry. Musculoskeletal: No kyphosis. Neuropsychiatric: Alert and oriented x3, affect grossly appropriate.  ECG: I personally reviewed the tracing from 08/10/2018 which showed dual-chamber pacing.  Recent Labwork:    Component Value Date/Time   CHOL 147 03/16/2017 1300   TRIG 146 03/16/2017 1300   HDL 42 03/16/2017 1300   CHOLHDL 3.5 03/16/2017 1300   VLDL 29 03/16/2017 1300   LDLCALC 76 03/16/2017 1300  04/14/2017: BUN 16; Creatinine, Ser 1.01; Hemoglobin 13.5; Platelets 207; Potassium 4.3; Sodium 139   Other Studies Reviewed Today:  Echocardiogram 04/14/2017: Study Conclusions  - Left ventricle: The cavity size was normal. Wall thickness was normal. Systolic function was normal. The estimated ejection fraction was in the range of 55% to 60%. - Mitral valve: Calcified annulus. Mildly thickened leaflets . - Left atrium: The atrium was mildly dilated. - Atrial septum: No defect or patent foramen ovale was identified.  Assessment and Plan:  1.  History of symptomatic second-degree type II heart block status post Medtronic pacemaker with follow-up per Dr. Rayann Heman.  2.  Paroxysmal atrial tachycardia, well controlled on Toprol-XL.  Refill provided.  3.  Essential hypertension, continue Cozaar at 100 mg daily.  Also on HCTZ.  Requesting interval lab work from Dr. Quintin Alto.  Current medicines were reviewed with the patient today.  Disposition:  Follow-up in 6 months.  Signed, Satira Sark, MD, [facility] 09/28/2018 11:58 AM    [facility] [place], [place], [place] [address] Phone: [phone]; Fax: [phone]

## 2018-09-28 ENCOUNTER — Encounter: Payer: Self-pay | Admitting: *Deleted

## 2018-09-28 ENCOUNTER — Encounter: Payer: Self-pay | Admitting: Cardiology

## 2018-09-28 ENCOUNTER — Ambulatory Visit: Payer: Medicare Other | Admitting: Cardiology

## 2018-09-28 VITALS — BP 117/71 | HR 67 | Ht 76.0 in | Wt 278.0 lb

## 2018-09-28 DIAGNOSIS — I471 Supraventricular tachycardia: Secondary | ICD-10-CM | POA: Diagnosis not present

## 2018-09-28 DIAGNOSIS — Z950 Presence of cardiac pacemaker: Secondary | ICD-10-CM

## 2018-09-28 DIAGNOSIS — I10 Essential (primary) hypertension: Secondary | ICD-10-CM | POA: Diagnosis not present

## 2018-09-28 DIAGNOSIS — I441 Atrioventricular block, second degree: Secondary | ICD-10-CM | POA: Diagnosis not present

## 2018-09-28 MED ORDER — METOPROLOL SUCCINATE ER 25 MG PO TB24: 25.0000 mg | ORAL_TABLET | Freq: Every day | ORAL | 2 refills | Status: DC

## 2018-09-28 NOTE — Patient Instructions (Addendum)

## 2018-10-27 ENCOUNTER — Ambulatory Visit (INDEPENDENT_AMBULATORY_CARE_PROVIDER_SITE_OTHER): Payer: Medicare Other | Admitting: *Deleted

## 2018-10-27 DIAGNOSIS — I441 Atrioventricular block, second degree: Secondary | ICD-10-CM | POA: Diagnosis not present

## 2018-10-28 LAB — CUP PACEART REMOTE DEVICE CHECK
Battery Remaining Longevity: 75 mo
Battery Voltage: 2.97 V
Brady Statistic AP VP Percent: 41.69 %
Brady Statistic AP VS Percent: 3.58 %
Brady Statistic AS VP Percent: 53.67 %
Brady Statistic AS VS Percent: 1.05 %
Brady Statistic RV Percent Paced: 95.36 %
Date Time Interrogation Session: 20200225041843
Implantable Lead Implant Date: 20180824
Implantable Lead Implant Date: 20180824
Implantable Lead Location: 753859
Implantable Lead Location: 753860
Implantable Lead Model: 5076
Implantable Lead Model: 5076
Implantable Pulse Generator Implant Date: 20180824
Lead Channel Impedance Value: 285 Ohm
Lead Channel Impedance Value: 304 Ohm
Lead Channel Impedance Value: 342 Ohm
Lead Channel Impedance Value: 361 Ohm
Lead Channel Pacing Threshold Amplitude: 2.125 V
Lead Channel Pacing Threshold Pulse Width: 0.4 ms
Lead Channel Sensing Intrinsic Amplitude: 3.625 mV
Lead Channel Sensing Intrinsic Amplitude: 3.625 mV
Lead Channel Sensing Intrinsic Amplitude: 3.625 mV
Lead Channel Setting Pacing Amplitude: 1.5 V
Lead Channel Setting Pacing Amplitude: 3 V
Lead Channel Setting Pacing Pulse Width: 1 ms
Lead Channel Setting Sensing Sensitivity: 0.9 mV
MDC IDC MSMT LEADCHNL RA PACING THRESHOLD AMPLITUDE: 0.5 V
MDC IDC MSMT LEADCHNL RA PACING THRESHOLD PULSEWIDTH: 0.4 ms
MDC IDC MSMT LEADCHNL RV SENSING INTR AMPL: 3.625 mV
MDC IDC STAT BRADY RA PERCENT PACED: 45.25 %

## 2018-11-04 ENCOUNTER — Encounter: Payer: Self-pay | Admitting: Cardiology

## 2018-11-04 NOTE — Progress Notes (Signed)
Remote pacemaker transmission.   

## 2018-11-16 MED ORDER — AMLODIPINE BESYLATE 10 MG PO TABS: 10.0000 mg | ORAL_TABLET | Freq: Every day | ORAL | 3 refills | Status: DC

## 2019-01-26 ENCOUNTER — Ambulatory Visit (INDEPENDENT_AMBULATORY_CARE_PROVIDER_SITE_OTHER): Payer: Medicare Other | Admitting: *Deleted

## 2019-01-26 DIAGNOSIS — I471 Supraventricular tachycardia: Secondary | ICD-10-CM

## 2019-01-26 DIAGNOSIS — I441 Atrioventricular block, second degree: Secondary | ICD-10-CM | POA: Diagnosis not present

## 2019-01-26 LAB — CUP PACEART REMOTE DEVICE CHECK
Battery Remaining Longevity: 67 mo
Battery Voltage: 2.97 V
Brady Statistic AP VP Percent: 47.97 %
Brady Statistic AP VS Percent: 4.24 %
Brady Statistic AS VP Percent: 46.66 %
Brady Statistic AS VS Percent: 1.13 %
Brady Statistic RA Percent Paced: 52.16 %
Brady Statistic RV Percent Paced: 94.63 %
Date Time Interrogation Session: 20200526123050
Implantable Lead Implant Date: 20180824
Implantable Lead Implant Date: 20180824
Implantable Lead Location: 753859
Implantable Lead Location: 753860
Implantable Lead Model: 5076
Implantable Lead Model: 5076
Implantable Pulse Generator Implant Date: 20180824
Lead Channel Impedance Value: 266 Ohm
Lead Channel Impedance Value: 285 Ohm
Lead Channel Impedance Value: 323 Ohm
Lead Channel Impedance Value: 342 Ohm
Lead Channel Pacing Threshold Amplitude: 0.5 V
Lead Channel Pacing Threshold Amplitude: 1.625 V
Lead Channel Pacing Threshold Pulse Width: 0.4 ms
Lead Channel Pacing Threshold Pulse Width: 0.4 ms
Lead Channel Sensing Intrinsic Amplitude: 3.25 mV
Lead Channel Sensing Intrinsic Amplitude: 3.25 mV
Lead Channel Sensing Intrinsic Amplitude: 4.625 mV
Lead Channel Sensing Intrinsic Amplitude: 4.625 mV
Lead Channel Setting Pacing Amplitude: 1.5 V
Lead Channel Setting Pacing Amplitude: 3 V
Lead Channel Setting Pacing Pulse Width: 1 ms
Lead Channel Setting Sensing Sensitivity: 0.9 mV

## 2019-02-05 NOTE — Progress Notes (Signed)
Remote pacemaker transmission.   

## 2019-03-21 IMAGING — DX DG CHEST 2V
3 series · 3 of 3 positions shown · non-contrast
Comparison: 01/24/2010.

CLINICAL DATA: Intermittent chest tightness with activity.

EXAM:
CHEST  2 VIEW

[chest pa]
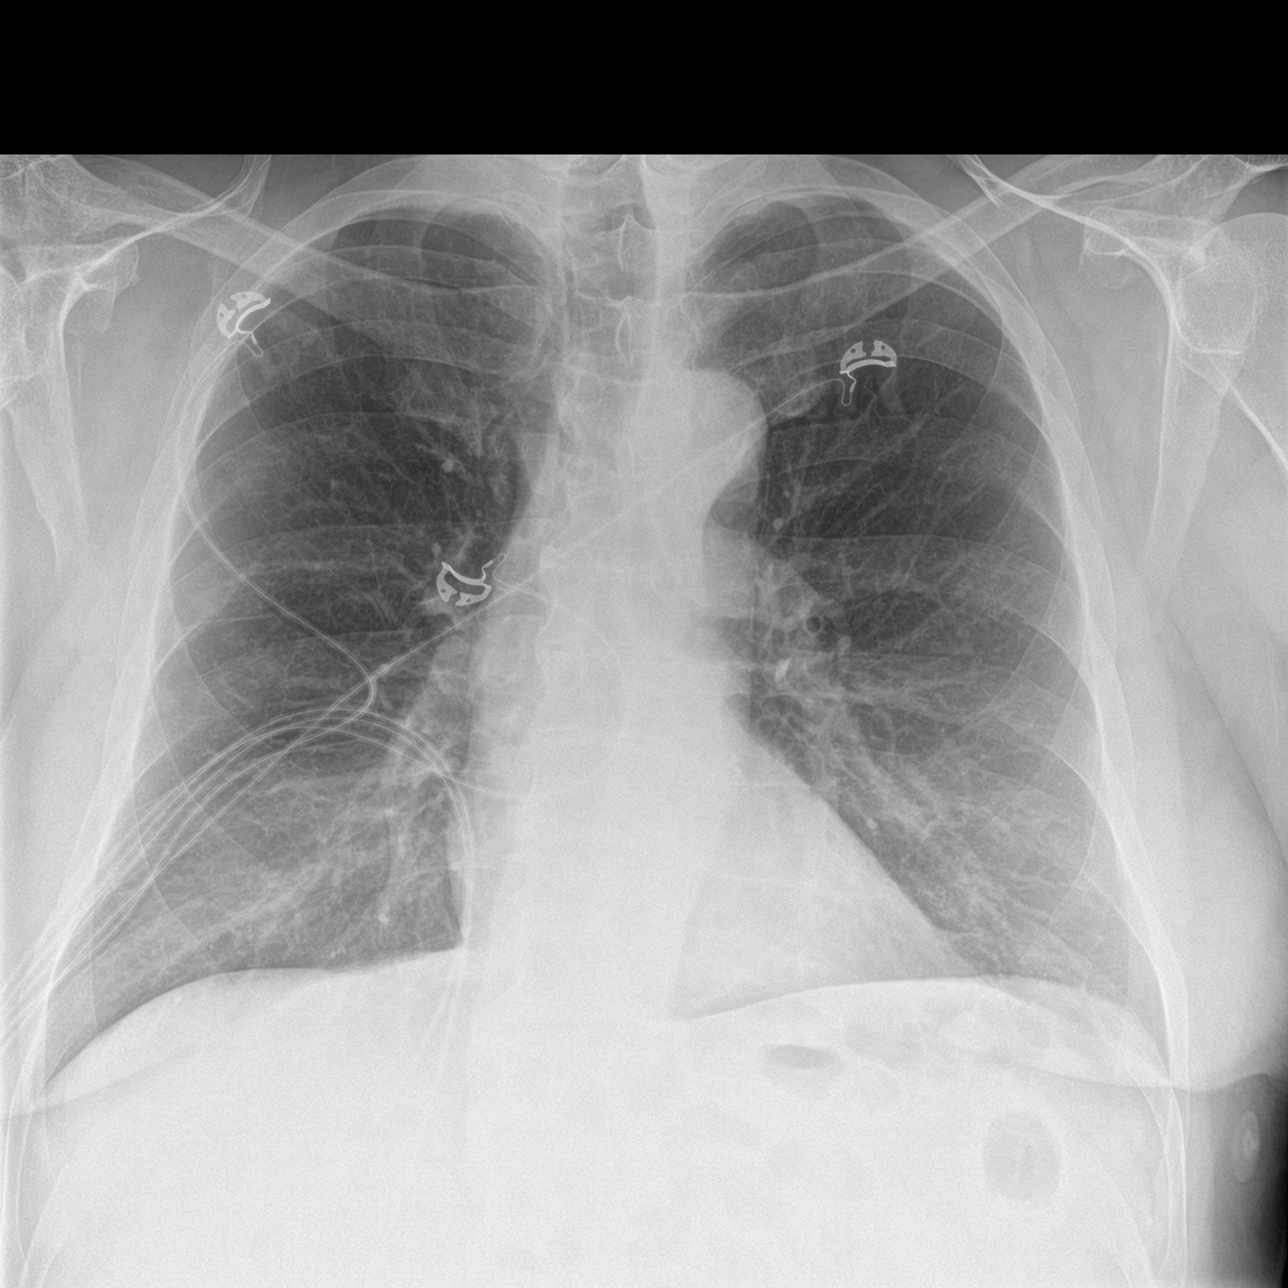

[chest lat (1 of 2)]
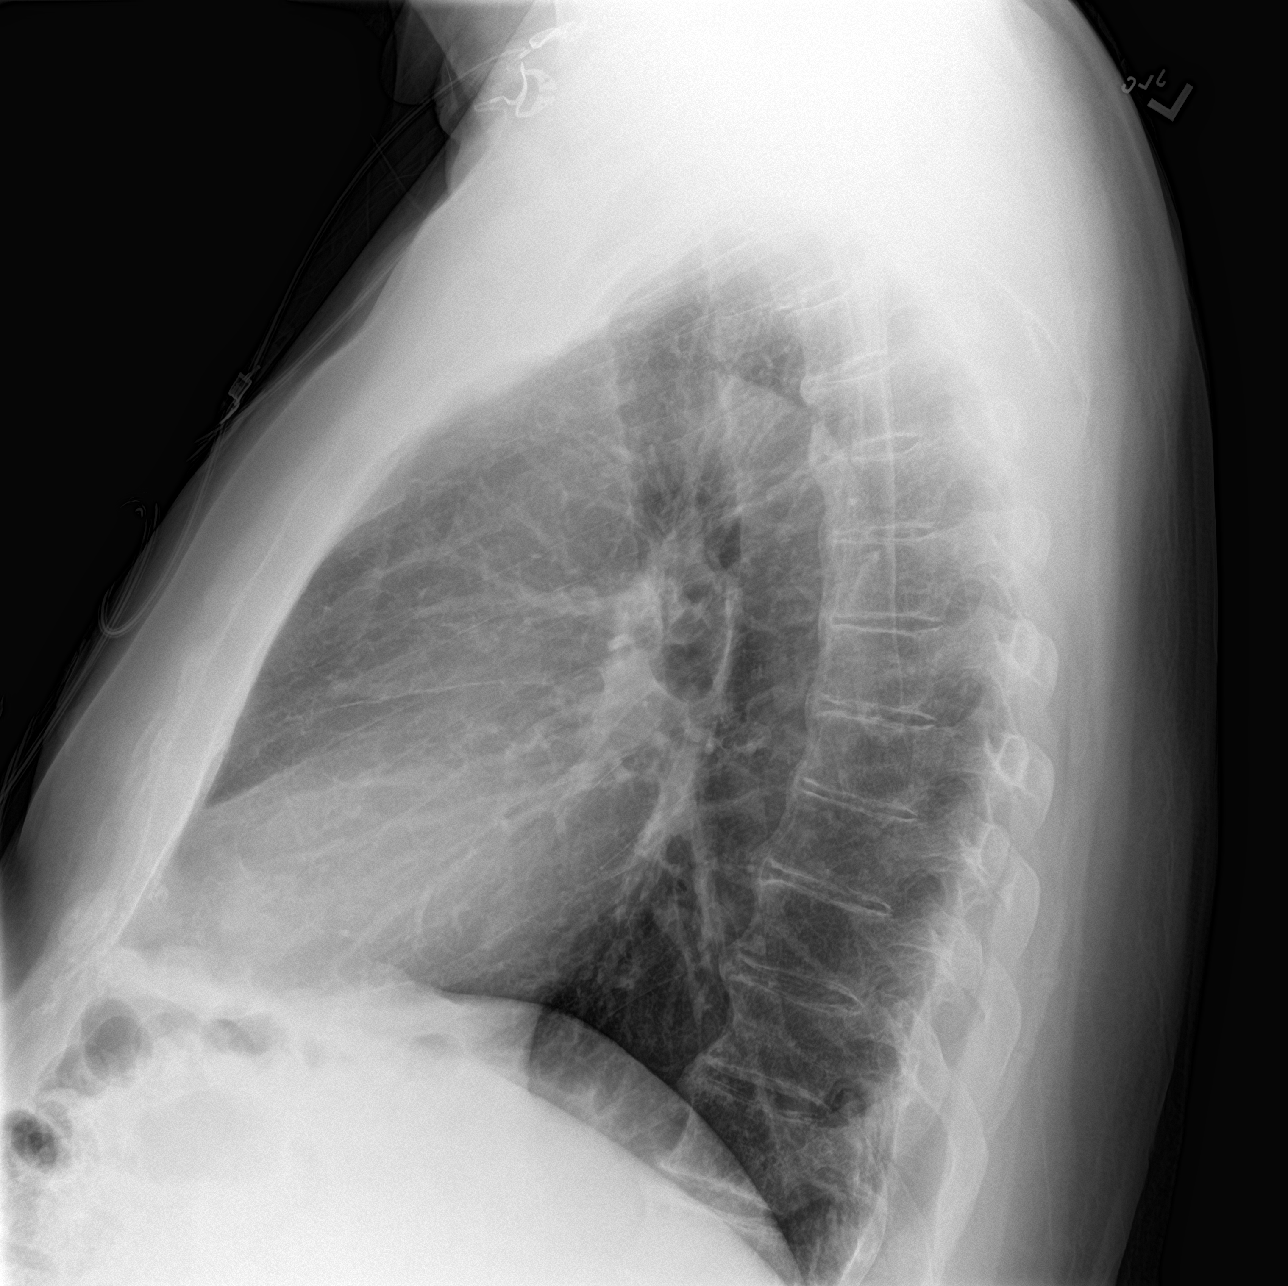

[chest lat (2 of 2)]
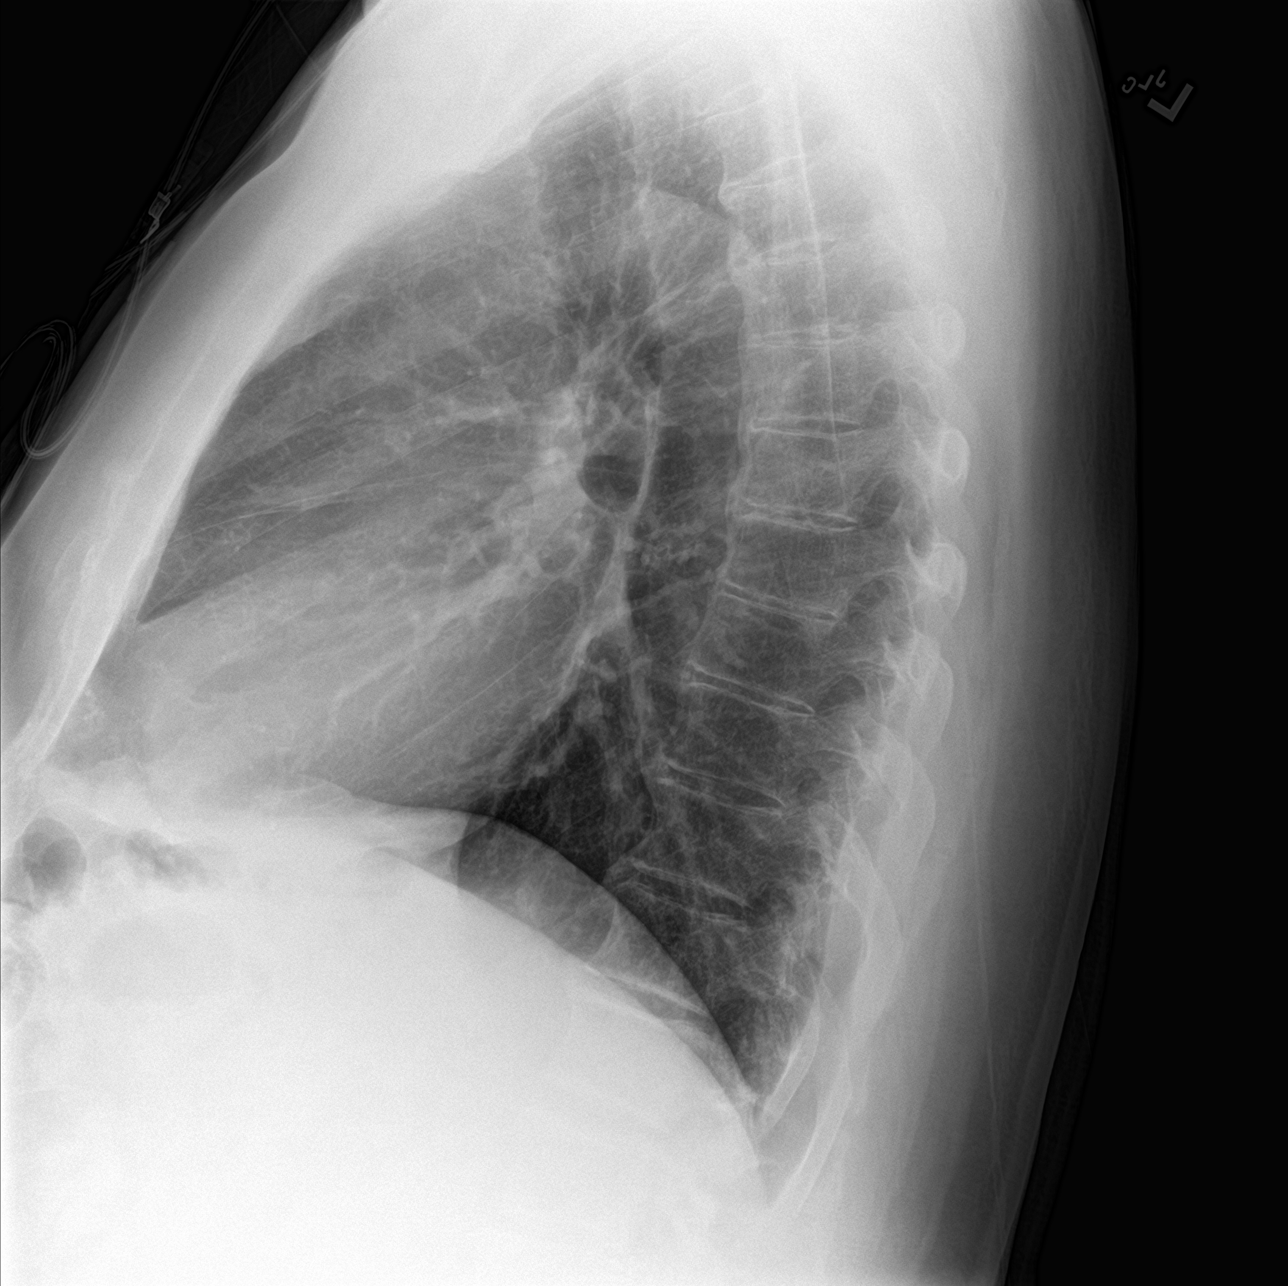

[3 of 3 positions shown; findings below may reference images not displayed]

FINDINGS: Normal sized heart. Tortuous aorta. Mild diffuse peribronchial
thickening. Thoracic spine degenerative changes, including changes
of DISH.
IMPRESSION: Mild chronic bronchitic changes.  No acute abnormality.

## 2019-04-27 ENCOUNTER — Ambulatory Visit (INDEPENDENT_AMBULATORY_CARE_PROVIDER_SITE_OTHER): Payer: Medicare Other | Admitting: *Deleted

## 2019-04-27 DIAGNOSIS — I441 Atrioventricular block, second degree: Secondary | ICD-10-CM

## 2019-04-27 DIAGNOSIS — I4510 Unspecified right bundle-branch block: Secondary | ICD-10-CM

## 2019-04-27 LAB — CUP PACEART REMOTE DEVICE CHECK
Battery Remaining Longevity: 64 mo
Battery Voltage: 2.96 V
Brady Statistic AP VP Percent: 38.61 %
Brady Statistic AP VS Percent: 11.08 %
Brady Statistic AS VP Percent: 44.77 %
Brady Statistic AS VS Percent: 5.55 %
Brady Statistic RA Percent Paced: 49.68 %
Brady Statistic RV Percent Paced: 83.38 %
Date Time Interrogation Session: 20200825053835
Implantable Lead Implant Date: 20180824
Implantable Lead Implant Date: 20180824
Implantable Lead Location: 753859
Implantable Lead Location: 753860
Implantable Lead Model: 5076
Implantable Lead Model: 5076
Implantable Pulse Generator Implant Date: 20180824
Lead Channel Impedance Value: 266 Ohm
Lead Channel Impedance Value: 285 Ohm
Lead Channel Impedance Value: 323 Ohm
Lead Channel Impedance Value: 342 Ohm
Lead Channel Pacing Threshold Amplitude: 0.5 V
Lead Channel Pacing Threshold Amplitude: 1.875 V
Lead Channel Pacing Threshold Pulse Width: 0.4 ms
Lead Channel Pacing Threshold Pulse Width: 0.4 ms
Lead Channel Sensing Intrinsic Amplitude: 3 mV
Lead Channel Sensing Intrinsic Amplitude: 3 mV
Lead Channel Sensing Intrinsic Amplitude: 4.875 mV
Lead Channel Sensing Intrinsic Amplitude: 4.875 mV
Lead Channel Setting Pacing Amplitude: 1.5 V
Lead Channel Setting Pacing Amplitude: 3 V
Lead Channel Setting Pacing Pulse Width: 1 ms
Lead Channel Setting Sensing Sensitivity: 0.9 mV

## 2019-05-04 NOTE — Progress Notes (Signed)
Remote pacemaker transmission.   

## 2019-06-29 ENCOUNTER — Other Ambulatory Visit: Payer: Self-pay | Admitting: *Deleted

## 2019-06-29 MED ORDER — METOPROLOL SUCCINATE ER 25 MG PO TB24: 25.0000 mg | ORAL_TABLET | Freq: Every day | ORAL | 2 refills | Status: DC

## 2019-07-19 ENCOUNTER — Encounter: Payer: Self-pay | Admitting: Cardiology

## 2019-07-19 ENCOUNTER — Telehealth: Payer: Self-pay | Admitting: Cardiology

## 2019-07-19 ENCOUNTER — Other Ambulatory Visit: Payer: Self-pay

## 2019-07-19 ENCOUNTER — Ambulatory Visit (INDEPENDENT_AMBULATORY_CARE_PROVIDER_SITE_OTHER): Payer: Medicare Other | Admitting: Cardiology

## 2019-07-19 VITALS — BP 124/68 | HR 63 | Ht 76.0 in | Wt 275.0 lb

## 2019-07-19 DIAGNOSIS — R011 Cardiac murmur, unspecified: Secondary | ICD-10-CM | POA: Diagnosis not present

## 2019-07-19 DIAGNOSIS — I441 Atrioventricular block, second degree: Secondary | ICD-10-CM | POA: Diagnosis not present

## 2019-07-19 DIAGNOSIS — I471 Supraventricular tachycardia: Secondary | ICD-10-CM

## 2019-07-19 DIAGNOSIS — I10 Essential (primary) hypertension: Secondary | ICD-10-CM | POA: Diagnosis not present

## 2019-07-19 NOTE — Patient Instructions (Addendum)
Medication Instructions:    Your physician recommends that you continue on your current medications as directed. Please refer to the Current Medication list given to you today.  Labwork:  NONE  Testing/Procedures: Your physician has requested that you have an echocardiogram in 8 months just before your next visit. Echocardiography is a painless test that uses sound waves to create images of your heart. It provides your doctor with information about the size and shape of your heart and how well your heart's chambers and valves are working. This procedure takes approximately one hour. There are no restrictions for this procedure.  Follow-Up:  Your physician recommends that you schedule a follow-up appointment in: 8 months. You will receive a reminder letter in the mail in about 4-6 months reminding you to call and schedule your appointment. If you don't receive this letter, please contact our office.  Any Other Special Instructions Will Be Listed Below (If Applicable).  If you need a refill on your cardiac medications before your next appointment, please call your pharmacy.

## 2019-07-19 NOTE — Progress Notes (Signed)
Cardiology Office Note  Date: 07/19/2019   ID: Jonathan Lamb, DOB Dec 08, 1945, MRN [id number]  PCP:  Manon Hilding, MD  Cardiologist:  Rozann Lesches, MD Electrophysiologist:  None   Chief Complaint  Patient presents with  . Cardiac follow-up    History of Present Illness: Jonathan Lamb is a 73 y.o. male last seen in January.  He is here for a routine follow-up visit.  He does not report any palpitations or exertional chest pain, occasionally has shortness of breath when he walks an incline, but walks a mile each day with his dog.  He follows with Dr. Rayann Heman in the device clinic, Medtronic pacemaker in place.  He has a history of symptomatic bradycardia with Mobitz type II second-degree heart block.  He has had no dizziness or syncope.  I personally reviewed her ECG today which shows ventricular pacing with atrial sensing and also dual-chamber pacing.  I reviewed his medications which are outlined below and stable.  Past Medical History:  Diagnosis Date  . Arthritis   . Benign prostatic hyperplasia   . Essential hypertension   . Hemorrhoid   . Hypercholesterolemia   . RBBB   . Second degree heart block    Medtronic pacemaker -Dr. Rayann Heman  . SVT (supraventricular tachycardia) (HCC)    Atrial tachycardia    Past Surgical History:  Procedure Laterality Date  . COLONOSCOPY N/A 03/24/2013   Procedure: COLONOSCOPY;  Surgeon: Rogene Houston, MD;  [facility];  Service: Endoscopy;  Laterality: N/A;  830  . COLONOSCOPY N/A 08/05/2018   Procedure: COLONOSCOPY;  Surgeon: Rogene Houston, MD;  [facility];  Service: Endoscopy;  Laterality: N/A;  12  . LAMINECTOMY     Lumbar  . PACEMAKER IMPLANT N/A 04/25/2017   MDT Azure XT DR MRI implanted by Dr Rayann Heman for second degree AV block  . POLYPECTOMY  08/05/2018   Procedure: POLYPECTOMY;  Surgeon: Rogene Houston, MD;  [facility];  Service: Endoscopy;;  ascending polyp, splenic flexure polyp,  sigmoid polyp  . PROSTATE BIOPSY    . THYROID SURGERY  1999   L lobectomy, complicated by esophageal laceration requiring esophageal repair     Current Outpatient Medications  Medication Sig Dispense Refill  . aspirin 81 MG tablet Take 1 tablet (81 mg total) by mouth every evening. 30 tablet   . Carboxymethylcellul-Glycerin (LUBRICATING EYE DROPS OP) Place 1 drop into both eyes daily as needed (dry eyes).    . finasteride (PROSCAR) 5 MG tablet Take 5 mg by mouth every evening.     . hydrochlorothiazide (HYDRODIURIL) 12.5 MG tablet Take 12.5 mg by mouth daily.    Marland Kitchen losartan (COZAAR) 100 MG tablet Take 1 tablet (100 mg total) by mouth daily. 90 tablet 2  . metoprolol succinate (TOPROL-XL) 25 MG 24 hr tablet Take 1 tablet (25 mg total) by mouth daily. 90 tablet 2  . Multiple Vitamin (MULTIVITAMIN) tablet Take 1 tablet by mouth every evening.     . multivitamin-lutein (OCUVITE-LUTEIN) CAPS capsule Take 1 capsule by mouth every evening.     . naproxen sodium (ALEVE) 220 MG tablet Take 440 mg by mouth daily as needed (pain).     . Omega-3 Fatty Acids (FISH OIL) 1200 MG CAPS Take 1,200 mg by mouth every evening.    . simvastatin (ZOCOR) 20 MG tablet Take 20 mg by mouth every evening.     . tamsulosin (FLOMAX) 0.4 MG CAPS capsule Take  0.4 mg by mouth every evening.     Marland Kitchen amLODipine (NORVASC) 10 MG tablet Take 1 tablet (10 mg total) by mouth daily. 180 tablet 3   No current facility-administered medications for this visit.    Allergies:  Patient has no known allergies.   Social History: The patient  reports that he has never smoked. He has never used smokeless tobacco. He reports current alcohol use. He reports that he does not use drugs.   ROS:  Please see the history of present illness. Otherwise, complete review of systems is positive for none.  All other systems are reviewed and negative.   Physical Exam: VS:  BP 124/68   Pulse 63   Ht 6\' 4"  (1.93 m)   Wt 275 lb (124.7 kg)   SpO2 98%    BMI 33.47 kg/m , BMI Body mass index is 33.47 kg/m.  Wt Readings from Last 3 Encounters:  07/19/19 275 lb (124.7 kg)  09/28/18 278 lb (126.1 kg)  08/10/18 286 lb (129.7 kg)    General: Patient appears comfortable at rest. HEENT: Conjunctiva and lids normal, wearing a mask. Neck: Supple, no elevated JVP or carotid bruits, no thyromegaly. Lungs: Clear to auscultation, nonlabored breathing at rest. Cardiac: Regular rate and rhythm, no S3, 2/6 systolic murmur, no pericardial rub. Abdomen: Soft, nontender, bowel sounds present. Extremities: No pitting edema, distal pulses 2+. Skin: Warm and dry. Musculoskeletal: No kyphosis. Neuropsychiatric: Alert and oriented x3, affect grossly appropriate.  ECG:  An ECG dated 08/10/2018 was personally reviewed today and demonstrated:  Dual-chamber paced rhythm.  Recent Labwork:  November 2019: Cholesterol 145, triglycerides 110, HDL 42, LDL 81, BUN 18, creatinine 1.17, potassium 5.2, AST 43, ALT 41  Other Studies Reviewed Today:  Echocardiogram 04/14/2017: Study Conclusions  - Left ventricle: The cavity size was normal. Wall thickness was normal. Systolic function was normal. The estimated ejection fraction was in the range of 55% to 60%. - Mitral valve: Calcified annulus. Mildly thickened leaflets . - Left atrium: The atrium was mildly dilated. - Atrial septum: No defect or patent foramen ovale was identified.  Assessment and Plan:  1.  History of symptomatic bradycardia with second-degree type II heart block, status post Medtronic pacemaker.  He is doing well at this point.  Keep follow-up with Dr. Rayann Heman.  2.  Paroxysmal atrial tachycardia, tolerating Toprol-XL without palpitations.  3.  Systolic murmur, somewhat more prominent on today's examination.  He has a history of valve calcification but no stenosis.  Update echocardiogram for next visit.  4.  Essential hypertension, well controlled today.  No changes made.  Medication  Adjustments/Labs and Tests Ordered: Current medicines are reviewed at length with the patient today.  Concerns regarding medicines are outlined above.   Tests Ordered: Orders Placed This Encounter  Procedures  . EKG 12-Lead  . ECHOCARDIOGRAM COMPLETE    Medication Changes: No orders of the defined types were placed in this encounter.   Disposition:  Follow up 8 months in the [place] office.  Signed, Satira Sark, MD, [facility] 07/19/2019 3:51 PM    [facility] at [place], [place], [place] [address] Phone: [phone]; Fax: [phone]

## 2019-07-19 NOTE — Telephone Encounter (Signed)
°  Precert needed for: Echo  Location: [facility]    Date: March 01, 2020

## 2019-07-27 ENCOUNTER — Ambulatory Visit (INDEPENDENT_AMBULATORY_CARE_PROVIDER_SITE_OTHER): Payer: Medicare Other | Admitting: *Deleted

## 2019-07-27 DIAGNOSIS — I441 Atrioventricular block, second degree: Secondary | ICD-10-CM | POA: Diagnosis not present

## 2019-07-27 LAB — CUP PACEART REMOTE DEVICE CHECK
Battery Remaining Longevity: 57 mo
Battery Voltage: 2.96 V
Brady Statistic AP VP Percent: 50.44 %
Brady Statistic AP VS Percent: 2.44 %
Brady Statistic AS VP Percent: 45.59 %
Brady Statistic AS VS Percent: 1.53 %
Brady Statistic RA Percent Paced: 52.97 %
Brady Statistic RV Percent Paced: 96.03 %
Date Time Interrogation Session: 20201124134904
Implantable Lead Implant Date: 20180824
Implantable Lead Implant Date: 20180824
Implantable Lead Location: 753859
Implantable Lead Location: 753860
Implantable Lead Model: 5076
Implantable Lead Model: 5076
Implantable Pulse Generator Implant Date: 20180824
Lead Channel Impedance Value: 285 Ohm
Lead Channel Impedance Value: 304 Ohm
Lead Channel Impedance Value: 342 Ohm
Lead Channel Impedance Value: 361 Ohm
Lead Channel Pacing Threshold Amplitude: 0.5 V
Lead Channel Pacing Threshold Amplitude: 2 V
Lead Channel Pacing Threshold Pulse Width: 0.4 ms
Lead Channel Pacing Threshold Pulse Width: 0.4 ms
Lead Channel Sensing Intrinsic Amplitude: 16.5 mV
Lead Channel Sensing Intrinsic Amplitude: 16.5 mV
Lead Channel Sensing Intrinsic Amplitude: 3.875 mV
Lead Channel Sensing Intrinsic Amplitude: 3.875 mV
Lead Channel Setting Pacing Amplitude: 1.5 V
Lead Channel Setting Pacing Amplitude: 3 V
Lead Channel Setting Pacing Pulse Width: 1 ms
Lead Channel Setting Sensing Sensitivity: 0.9 mV

## 2019-08-15 NOTE — Progress Notes (Signed)
Cardiology Office Note Date:  08/16/2019  Patient ID:  Jonathan, Lamb 25-Jun-1946, MRN [id number] PCP:  Jonathan Hilding, MD  Cardiologist:  Dr. Domenic Polite Electrophysiologist: Dr. Rayann Heman    Chief Complaint:  annual device visit  History of Present Illness: Jonathan Lamb is a 73 y.o. male with history of HTN, HLD, RBBB, Atach, heart block w/PPM.  He comes in today to be seen for Dr. Rayann Heman, last seen byhim Dec 2019.  At that time doing well, he had toured [place] that year, was feeling well.  Discussed his chronically elevated RV threshold, that day was 1.5V./1.74ms and programed 3V/1.5ms.  More recently saw Dr. Domenic Polite Nov 2020.  Reported getting winded with inclines, though walked a mile daily with his dog without difficulty.  No changes were made.  He again confirms feeling well, no CP, palpitations, no SOB.  Reports he and his wife walk daily and denies any changes in his exertional capacity.  No symptoms of PND or orthopnea, says he sleeps like a baby. No near syncope or syncope.  He is noted to have skin rash b/l LE, he says this has been there for quite a while and has been evaluated, no particular diagnosis that he recalls or treatment recommended.  Plans to have it looked at again., no itching.   Device information MDT dual chamber PPM, implanted 04/25/2017 Known chronically elevated RV threshold.  Past Medical History:  Diagnosis Date  . Arthritis   . Benign prostatic hyperplasia   . Essential hypertension   . Hemorrhoid   . Hypercholesterolemia   . RBBB   . Second degree heart block    Medtronic pacemaker -Dr. Rayann Heman  . SVT (supraventricular tachycardia) (HCC)    Atrial tachycardia    Past Surgical History:  Procedure Laterality Date  . COLONOSCOPY N/A 03/24/2013   Procedure: COLONOSCOPY;  Surgeon: Rogene Houston, MD;  [facility];  Service: Endoscopy;  Laterality: N/A;  830  . COLONOSCOPY N/A 08/05/2018   Procedure: COLONOSCOPY;  Surgeon:  Rogene Houston, MD;  [facility];  Service: Endoscopy;  Laterality: N/A;  12  . LAMINECTOMY     Lumbar  . PACEMAKER IMPLANT N/A 04/25/2017   MDT Azure XT DR MRI implanted by Dr Rayann Heman for second degree AV block  . POLYPECTOMY  08/05/2018   Procedure: POLYPECTOMY;  Surgeon: Rogene Houston, MD;  [facility];  Service: Endoscopy;;  ascending polyp, splenic flexure polyp, sigmoid polyp  . PROSTATE BIOPSY    . THYROID SURGERY  1999   L lobectomy, complicated by esophageal laceration requiring esophageal repair     Current Outpatient Medications  Medication Sig Dispense Refill  . aspirin 81 MG tablet Take 1 tablet (81 mg total) by mouth every evening. 30 tablet   . Carboxymethylcellul-Glycerin (LUBRICATING EYE DROPS OP) Place 1 drop into both eyes daily as needed (dry eyes).    . finasteride (PROSCAR) 5 MG tablet Take 5 mg by mouth every evening.     . metoprolol succinate (TOPROL-XL) 25 MG 24 hr tablet Take 1 tablet (25 mg total) by mouth daily. 90 tablet 2  . Multiple Vitamin (MULTIVITAMIN) tablet Take 1 tablet by mouth every evening.     . multivitamin-lutein (OCUVITE-LUTEIN) CAPS capsule Take 1 capsule by mouth every evening.     . naproxen sodium (ALEVE) 220 MG tablet Take 440 mg by mouth daily as needed (pain).     . Omega-3 Fatty Acids (FISH OIL) 1200  MG CAPS Take 1,200 mg by mouth every evening.    . simvastatin (ZOCOR) 20 MG tablet Take 20 mg by mouth every evening.     . tamsulosin (FLOMAX) 0.4 MG CAPS capsule Take 0.4 mg by mouth every evening.     Marland Kitchen amLODipine (NORVASC) 10 MG tablet Take 1 tablet (10 mg total) by mouth daily. 180 tablet 3   No current facility-administered medications for this visit.    Allergies:   Patient has no known allergies.   Social History:  The patient  reports that he has never smoked. He has never used smokeless tobacco. He reports current alcohol use. He reports that he does not use drugs.   Family History:  The patient's  family history includes Arthritis in his brother; Deep vein thrombosis in his mother; Heart attack (age of onset: 41) in his maternal grandfather; Heart attack (age of onset: 15) in his father; Hypertension in his father and mother; Pancreatic cancer in his paternal grandfather; Pelvic inflammatory disease in his father; Stroke in his mother.  ROS:  Please see the history of present illness.  All other systems are reviewed and otherwise negative.   PHYSICAL EXAM:  VS:  BP 138/76   Pulse 60   Ht 6\' 4"  (1.93 m)   Wt 275 lb (124.7 kg)   BMI 33.47 kg/m  BMI: Body mass index is 33.47 kg/m. Well nourished, well developed, in no acute distress  HEENT: normocephalic, atraumatic  Neck: no JVD, carotid bruits or masses Cardiac:  RRR; no significant murmurs, no rubs, or gallops Lungs:  CTA b/l, no wheezing, rhonchi or rales  Abd: soft, nontender MS: no deformity or atrophy Ext: 1+ edema to mid-shin (pt reports edema at baseline, perhaps more of late) Skin: warm and dry, he has circular/ring skin rash b/l LE Neuro:  No gross deficits appreciated Psych: euthymic mood, full affect  PPM site is stable, no tethering or discomfort   EKG:  AV paced  PPM interrogation done today and reviewed by myself:  RA lead measurements good RV lead known to have chronically elevated thresholds He is dependent today at 40bpm RV threshold is 1.75V/1.64ms No arrhythmias or device observations noted RV lead output is increased to 3.5V (from 3.0)given device dependence This makes battery estimate 3.8years 49.9% AP 92.7% VP   Echocardiogram 04/14/2017: Study Conclusions - Left ventricle: The cavity size was normal. Wall thickness was normal. Systolic function was normal. The estimated ejection fraction was in the range of 55% to 60%. - Mitral valve: Calcified annulus. Mildly thickened leaflets . - Left atrium: The atrium was mildly dilated. - Atrial septum: No defect or patent foramen ovale was  identified.    Recent Labs: No results found for requested labs within last 8760 hours.  No results found for requested labs within last 8760 hours.   CrCl cannot be calculated (Patient's most recent lab result is older than the maximum 21 days allowed.).   Wt Readings from Last 3 Encounters:  08/16/19 275 lb (124.7 kg)  07/19/19 275 lb (124.7 kg)  09/28/18 278 lb (126.1 kg)     Other studies reviewed: Additional studies/records reviewed today include: summarized above  ASSESSMENT AND PLAN:  1. PPM     Intact function     Known chronic elevation RV lead threshold     RV lead outo threshold is on monitor  2. HTN     Reports at home is better, earlier today at his urologist was 129/72  3. ATach  0% burden    continue BB tx  4. Edema     Not new, mentions he has had some edema for years, perhaps more so on the last couple months (known to his PMD and D. McDowell, per pt)     No SOB, DOE, PND, orthopnea     Possibly his amlodipine, pt says his PMD when saw him a few weeks ago said was his age/veins     I will defer to his PMD and Dr. Domenic Polite   Disposition: F/u with Q 3 mo remotes, and in-clinic 1 year  Current medicines are reviewed at length with the patient today.  The patient did not have any concerns regarding medicines.  Venetia Night, PA-C 08/16/2019 3:14 PM     [place] [address] [phone] (office)  [phone] (fax)

## 2019-08-16 ENCOUNTER — Other Ambulatory Visit: Payer: Self-pay

## 2019-08-16 ENCOUNTER — Ambulatory Visit (INDEPENDENT_AMBULATORY_CARE_PROVIDER_SITE_OTHER): Payer: Medicare Other | Admitting: Physician Assistant

## 2019-08-16 VITALS — BP 138/76 | HR 60 | Ht 76.0 in | Wt 275.0 lb

## 2019-08-16 DIAGNOSIS — I471 Supraventricular tachycardia: Secondary | ICD-10-CM | POA: Diagnosis not present

## 2019-08-16 DIAGNOSIS — I10 Essential (primary) hypertension: Secondary | ICD-10-CM

## 2019-08-16 DIAGNOSIS — R609 Edema, unspecified: Secondary | ICD-10-CM

## 2019-08-16 DIAGNOSIS — Z950 Presence of cardiac pacemaker: Secondary | ICD-10-CM

## 2019-08-16 NOTE — Patient Instructions (Signed)
Medication Instructions:  Your physician recommends that you continue on your current medications as directed. Please refer to the Current Medication list given to you today.  *If you need a refill on your cardiac medications before your next appointment, please call your pharmacy*  Lab Work: [place]   If you have labs (blood work) drawn today and your tests are completely normal, you will receive your results only by: Marland Kitchen MyChart Message (if you have MyChart) OR . A paper copy in the mail If you have any lab test that is abnormal or we need to change your treatment, we will call you to review the results.  Testing/Procedures: NONE ORDERED  TODAY   Follow-Up: At [facility], you and your health needs are our priority.  As part of our continuing mission to provide you with exceptional heart care, we have created designated Provider Care Teams.  These Care Teams include your primary Cardiologist (physician) and Advanced Practice Providers (APPs -  Physician Assistants and Nurse Practitioners) who all work together to provide you with the care you need, when you need it.  Your next appointment:   1 year(s)  The format for your next appointment:   In Person  Provider:  Your physician wants you to follow-up in: [place] will receive a reminder letter in the mail two months in advance. If you don't receive a letter, please call our office to schedule the follow-up appointment.   Other Instructions

## 2019-08-24 NOTE — Progress Notes (Signed)
PPM remote 

## 2019-09-14 ENCOUNTER — Other Ambulatory Visit: Payer: Self-pay | Admitting: Cardiology

## 2019-09-29 ENCOUNTER — Ambulatory Visit: Payer: Medicare Other

## 2019-10-08 ENCOUNTER — Ambulatory Visit: Payer: Medicare Other

## 2019-10-20 ENCOUNTER — Ambulatory Visit: Payer: Medicare Other

## 2019-10-26 ENCOUNTER — Ambulatory Visit (INDEPENDENT_AMBULATORY_CARE_PROVIDER_SITE_OTHER): Payer: Medicare PPO | Admitting: *Deleted

## 2019-10-26 DIAGNOSIS — I441 Atrioventricular block, second degree: Secondary | ICD-10-CM

## 2019-10-26 LAB — CUP PACEART REMOTE DEVICE CHECK
Battery Remaining Longevity: 42 mo
Battery Voltage: 2.94 V
Brady Statistic AP VP Percent: 48.76 %
Brady Statistic AP VS Percent: 10.29 %
Brady Statistic AS VP Percent: 36.84 %
Brady Statistic AS VS Percent: 4.11 %
Brady Statistic RA Percent Paced: 59.11 %
Brady Statistic RV Percent Paced: 85.6 %
Date Time Interrogation Session: 20210223083744
Implantable Lead Implant Date: 20180824
Implantable Lead Implant Date: 20180824
Implantable Lead Location: 753859
Implantable Lead Location: 753860
Implantable Lead Model: 5076
Implantable Lead Model: 5076
Implantable Pulse Generator Implant Date: 20180824
Lead Channel Impedance Value: 266 Ohm
Lead Channel Impedance Value: 304 Ohm
Lead Channel Impedance Value: 342 Ohm
Lead Channel Impedance Value: 380 Ohm
Lead Channel Pacing Threshold Amplitude: 0.5 V
Lead Channel Pacing Threshold Amplitude: 2.25 V
Lead Channel Pacing Threshold Pulse Width: 0.4 ms
Lead Channel Pacing Threshold Pulse Width: 0.4 ms
Lead Channel Sensing Intrinsic Amplitude: 3 mV
Lead Channel Sensing Intrinsic Amplitude: 3 mV
Lead Channel Sensing Intrinsic Amplitude: 4.5 mV
Lead Channel Sensing Intrinsic Amplitude: 4.5 mV
Lead Channel Setting Pacing Amplitude: 1.5 V
Lead Channel Setting Pacing Amplitude: 3.5 V
Lead Channel Setting Pacing Pulse Width: 1 ms
Lead Channel Setting Sensing Sensitivity: 0.9 mV

## 2019-10-27 NOTE — Progress Notes (Signed)
PPM Remote  

## 2019-10-29 ENCOUNTER — Ambulatory Visit: Payer: Medicare Other

## 2019-11-11 DIAGNOSIS — H353131 Nonexudative age-related macular degeneration, bilateral, early dry stage: Secondary | ICD-10-CM | POA: Diagnosis not present

## 2019-12-07 ENCOUNTER — Other Ambulatory Visit: Payer: Self-pay | Admitting: Cardiology

## 2020-01-13 DIAGNOSIS — I10 Essential (primary) hypertension: Secondary | ICD-10-CM | POA: Diagnosis not present

## 2020-01-13 DIAGNOSIS — R5382 Chronic fatigue, unspecified: Secondary | ICD-10-CM | POA: Diagnosis not present

## 2020-01-13 DIAGNOSIS — R7301 Impaired fasting glucose: Secondary | ICD-10-CM | POA: Diagnosis not present

## 2020-01-13 DIAGNOSIS — E782 Mixed hyperlipidemia: Secondary | ICD-10-CM | POA: Diagnosis not present

## 2020-01-13 DIAGNOSIS — E7800 Pure hypercholesterolemia, unspecified: Secondary | ICD-10-CM | POA: Diagnosis not present

## 2020-01-20 DIAGNOSIS — Z6832 Body mass index (BMI) 32.0-32.9, adult: Secondary | ICD-10-CM | POA: Diagnosis not present

## 2020-01-20 DIAGNOSIS — R7301 Impaired fasting glucose: Secondary | ICD-10-CM | POA: Diagnosis not present

## 2020-01-20 DIAGNOSIS — R002 Palpitations: Secondary | ICD-10-CM | POA: Diagnosis not present

## 2020-01-20 DIAGNOSIS — G619 Inflammatory polyneuropathy, unspecified: Secondary | ICD-10-CM | POA: Diagnosis not present

## 2020-01-20 DIAGNOSIS — Z0001 Encounter for general adult medical examination with abnormal findings: Secondary | ICD-10-CM | POA: Diagnosis not present

## 2020-01-20 DIAGNOSIS — I10 Essential (primary) hypertension: Secondary | ICD-10-CM | POA: Diagnosis not present

## 2020-01-20 DIAGNOSIS — N401 Enlarged prostate with lower urinary tract symptoms: Secondary | ICD-10-CM | POA: Diagnosis not present

## 2020-01-25 ENCOUNTER — Ambulatory Visit (INDEPENDENT_AMBULATORY_CARE_PROVIDER_SITE_OTHER): Payer: Medicare PPO | Admitting: *Deleted

## 2020-01-25 DIAGNOSIS — I441 Atrioventricular block, second degree: Secondary | ICD-10-CM | POA: Diagnosis not present

## 2020-01-25 LAB — CUP PACEART REMOTE DEVICE CHECK
Battery Remaining Longevity: 32 mo
Battery Voltage: 2.93 V
Brady Statistic AP VP Percent: 52.33 %
Brady Statistic AP VS Percent: 0.14 %
Brady Statistic AS VP Percent: 47.28 %
Brady Statistic AS VS Percent: 0.24 %
Brady Statistic RA Percent Paced: 52.53 %
Brady Statistic RV Percent Paced: 99.61 %
Date Time Interrogation Session: 20210525003825
Implantable Lead Implant Date: 20180824
Implantable Lead Implant Date: 20180824
Implantable Lead Location: 753859
Implantable Lead Location: 753860
Implantable Lead Model: 5076
Implantable Lead Model: 5076
Implantable Pulse Generator Implant Date: 20180824
Lead Channel Impedance Value: 304 Ohm
Lead Channel Impedance Value: 304 Ohm
Lead Channel Impedance Value: 361 Ohm
Lead Channel Impedance Value: 361 Ohm
Lead Channel Pacing Threshold Amplitude: 0.5 V
Lead Channel Pacing Threshold Amplitude: 2.125 V
Lead Channel Pacing Threshold Pulse Width: 0.4 ms
Lead Channel Pacing Threshold Pulse Width: 0.4 ms
Lead Channel Sensing Intrinsic Amplitude: 18 mV
Lead Channel Sensing Intrinsic Amplitude: 18 mV
Lead Channel Sensing Intrinsic Amplitude: 3.125 mV
Lead Channel Sensing Intrinsic Amplitude: 3.125 mV
Lead Channel Setting Pacing Amplitude: 1.5 V
Lead Channel Setting Pacing Amplitude: 3.5 V
Lead Channel Setting Pacing Pulse Width: 1 ms
Lead Channel Setting Sensing Sensitivity: 0.9 mV

## 2020-01-25 NOTE — Progress Notes (Signed)
Remote pacemaker transmission.   

## 2020-02-14 DIAGNOSIS — N138 Other obstructive and reflux uropathy: Secondary | ICD-10-CM | POA: Diagnosis not present

## 2020-02-14 DIAGNOSIS — R972 Elevated prostate specific antigen [PSA]: Secondary | ICD-10-CM | POA: Diagnosis not present

## 2020-02-14 DIAGNOSIS — N529 Male erectile dysfunction, unspecified: Secondary | ICD-10-CM | POA: Diagnosis not present

## 2020-02-14 DIAGNOSIS — N401 Enlarged prostate with lower urinary tract symptoms: Secondary | ICD-10-CM | POA: Diagnosis not present

## 2020-02-29 ENCOUNTER — Telehealth: Payer: Self-pay

## 2020-02-29 NOTE — Telephone Encounter (Signed)
The patient wife wanted to know if [employer] gotten back in touch with Renee about the battery usage? She states it has been a month and have not heard anything about it.

## 2020-03-01 ENCOUNTER — Ambulatory Visit (INDEPENDENT_AMBULATORY_CARE_PROVIDER_SITE_OTHER): Payer: Medicare PPO

## 2020-03-01 ENCOUNTER — Telehealth: Payer: Self-pay | Admitting: *Deleted

## 2020-03-01 DIAGNOSIS — R011 Cardiac murmur, unspecified: Secondary | ICD-10-CM

## 2020-03-01 NOTE — Telephone Encounter (Signed)
-----   Message from Satira Sark, MD sent at 03/01/2020 10:23 AM EDT ----- Results reviewed. Echocardiogram shows stable LVEF, normal range at 55 to 60%. Degree of valvular calcification has increased since the previous study, there is mitral annular calcification which is not causing any valve dysfunction. Aortic valve is mildly stenosed in comparison to previous study, but would not be expected to be causing him any symptoms and can be followed over time. This would explain the more prominent heart murmur that I heard at his last visit. He does not need to change any medications at this time.

## 2020-03-02 NOTE — Telephone Encounter (Signed)
Patient informed. Copy sent to PCP °

## 2020-03-08 ENCOUNTER — Telehealth: Payer: Self-pay | Admitting: Physician Assistant

## 2020-03-08 NOTE — Telephone Encounter (Signed)
Called patient to follow up on pacemaker battery inquiry.  apologized for the wait on my reply, [employer] email had gone to my junk file, and made them aware that they had responded in a timely manor.   Discussed  [employer] technical services and reported based on information they had that the battery performance is normal battery depletion based on programming and pacing.  Discussed as Raquel Sarna had previously this number will fluctuate particularly as his pacing % goes up/down.  I had also updated Dr. Rayann Heman of the recent patient inquiry and review by [employer] tech support.  I reviewed my last in clinic check and last 2 remotes with Dr. Rayann Heman, noting back to 2018 has been found to have chronically elevated RV thresholds with numbers similar to mine in Dec.  Recommended reassurance for the patient and that we are following his device and he would be happy to see him.   The patient and his wife (on speaker phone) were appreciative of the call and were happy to keep his planned in-clinic visit at his year mark in December, I will ask our scheduler to make sure this is with Dr. Rayann Heman.  Encouraged to call if any further concerns or questions.  Tommye Standard, PA-C

## 2020-04-25 ENCOUNTER — Ambulatory Visit (INDEPENDENT_AMBULATORY_CARE_PROVIDER_SITE_OTHER): Payer: Medicare PPO | Admitting: *Deleted

## 2020-04-25 DIAGNOSIS — I441 Atrioventricular block, second degree: Secondary | ICD-10-CM

## 2020-04-25 LAB — CUP PACEART REMOTE DEVICE CHECK
Battery Remaining Longevity: 26 mo
Battery Voltage: 2.92 V
Brady Statistic AP VP Percent: 55.65 %
Brady Statistic AP VS Percent: 0.02 %
Brady Statistic AS VP Percent: 43.74 %
Brady Statistic AS VS Percent: 0.59 %
Brady Statistic RA Percent Paced: 55.86 %
Brady Statistic RV Percent Paced: 99.39 %
Date Time Interrogation Session: 20210824115221
Implantable Lead Implant Date: 20180824
Implantable Lead Implant Date: 20180824
Implantable Lead Location: 753859
Implantable Lead Location: 753860
Implantable Lead Model: 5076
Implantable Lead Model: 5076
Implantable Pulse Generator Implant Date: 20180824
Lead Channel Impedance Value: 304 Ohm
Lead Channel Impedance Value: 304 Ohm
Lead Channel Impedance Value: 342 Ohm
Lead Channel Impedance Value: 342 Ohm
Lead Channel Pacing Threshold Amplitude: 0.375 V
Lead Channel Pacing Threshold Amplitude: 1.875 V
Lead Channel Pacing Threshold Pulse Width: 0.4 ms
Lead Channel Pacing Threshold Pulse Width: 0.4 ms
Lead Channel Sensing Intrinsic Amplitude: 3.25 mV
Lead Channel Sensing Intrinsic Amplitude: 3.25 mV
Lead Channel Sensing Intrinsic Amplitude: 9.625 mV
Lead Channel Sensing Intrinsic Amplitude: 9.625 mV
Lead Channel Setting Pacing Amplitude: 1.5 V
Lead Channel Setting Pacing Amplitude: 3.5 V
Lead Channel Setting Pacing Pulse Width: 1 ms
Lead Channel Setting Sensing Sensitivity: 0.9 mV

## 2020-05-01 NOTE — Progress Notes (Signed)
Remote pacemaker transmission.   

## 2020-05-11 NOTE — Telephone Encounter (Signed)
Let's go ahead and set up a follow-up visit. I will make sure that nursing knows.

## 2020-05-26 DIAGNOSIS — I471 Supraventricular tachycardia: Secondary | ICD-10-CM | POA: Diagnosis not present

## 2020-05-26 DIAGNOSIS — I10 Essential (primary) hypertension: Secondary | ICD-10-CM | POA: Diagnosis not present

## 2020-05-26 DIAGNOSIS — E785 Hyperlipidemia, unspecified: Secondary | ICD-10-CM | POA: Diagnosis not present

## 2020-05-26 DIAGNOSIS — Z791 Long term (current) use of non-steroidal anti-inflammatories (NSAID): Secondary | ICD-10-CM | POA: Diagnosis not present

## 2020-05-26 DIAGNOSIS — N400 Enlarged prostate without lower urinary tract symptoms: Secondary | ICD-10-CM | POA: Diagnosis not present

## 2020-05-26 DIAGNOSIS — Z7982 Long term (current) use of aspirin: Secondary | ICD-10-CM | POA: Diagnosis not present

## 2020-05-26 DIAGNOSIS — G8929 Other chronic pain: Secondary | ICD-10-CM | POA: Diagnosis not present

## 2020-05-26 DIAGNOSIS — E669 Obesity, unspecified: Secondary | ICD-10-CM | POA: Diagnosis not present

## 2020-05-26 DIAGNOSIS — Z6832 Body mass index (BMI) 32.0-32.9, adult: Secondary | ICD-10-CM | POA: Diagnosis not present

## 2020-05-26 DIAGNOSIS — Z833 Family history of diabetes mellitus: Secondary | ICD-10-CM | POA: Diagnosis not present

## 2020-05-26 DIAGNOSIS — Z950 Presence of cardiac pacemaker: Secondary | ICD-10-CM | POA: Diagnosis not present

## 2020-06-26 ENCOUNTER — Ambulatory Visit: Payer: Medicare PPO | Admitting: Cardiology

## 2020-06-27 ENCOUNTER — Ambulatory Visit: Payer: Medicare PPO | Admitting: Cardiology

## 2020-06-27 ENCOUNTER — Encounter: Payer: Self-pay | Admitting: Cardiology

## 2020-06-27 VITALS — BP 124/74 | HR 81 | Ht 76.0 in | Wt 272.0 lb

## 2020-06-27 DIAGNOSIS — Z950 Presence of cardiac pacemaker: Secondary | ICD-10-CM | POA: Diagnosis not present

## 2020-06-27 DIAGNOSIS — I471 Supraventricular tachycardia: Secondary | ICD-10-CM

## 2020-06-27 DIAGNOSIS — I350 Nonrheumatic aortic (valve) stenosis: Secondary | ICD-10-CM | POA: Diagnosis not present

## 2020-06-27 NOTE — Progress Notes (Signed)
Cardiology Office Note  Date: 06/27/2020   ID: Jonathan ARGUIJO, DOB 08/11/1946, MRN [id number]  PCP:  Manon Hilding, MD  Cardiologist:  Rozann Lesches, MD Electrophysiologist:  None   Chief Complaint  Patient presents with  . Cardiac follow-up    History of Present Illness: Jonathan Lamb is a 74 y.o. male last seen in November 2020.  He is here today with his wife for a follow-up visit.  He does not report any progressive dyspnea on exertion with typical activities, mostly notable when he is walking up an incline.  No definite sense of palpitations or chest pain, no syncope.  He was seen in the EP clinic in December 2020, I reviewed the note.  He continues to follow with Dr. Rayann Heman, Medtronic pacemaker in place, also history of atrial tachycardia.  Device interrogation in August revealed normal function.  I reviewed his medications which are outlined below and stable from a cardiac perspective.  I personally reviewed his ECG today which shows a ventricular paced rhythm with atrial sensing.  He did have a follow-up echocardiogram in June as noted below.  Past Medical History:  Diagnosis Date  . Arthritis   . Benign prostatic hyperplasia   . Essential hypertension   . Hemorrhoid   . Hypercholesterolemia   . RBBB   . Second degree heart block    Medtronic pacemaker -Dr. Rayann Heman  . SVT (supraventricular tachycardia) (HCC)    Atrial tachycardia    Past Surgical History:  Procedure Laterality Date  . COLONOSCOPY N/A 03/24/2013   Procedure: COLONOSCOPY;  Surgeon: Rogene Houston, MD;  [facility];  Service: Endoscopy;  Laterality: N/A;  830  . COLONOSCOPY N/A 08/05/2018   Procedure: COLONOSCOPY;  Surgeon: Rogene Houston, MD;  [facility];  Service: Endoscopy;  Laterality: N/A;  12  . LAMINECTOMY     Lumbar  . PACEMAKER IMPLANT N/A 04/25/2017   MDT Azure XT DR MRI implanted by Dr Rayann Heman for second degree AV block  . POLYPECTOMY  08/05/2018    Procedure: POLYPECTOMY;  Surgeon: Rogene Houston, MD;  [facility];  Service: Endoscopy;;  ascending polyp, splenic flexure polyp, sigmoid polyp  . PROSTATE BIOPSY    . THYROID SURGERY  1999   L lobectomy, complicated by esophageal laceration requiring esophageal repair     Current Outpatient Medications  Medication Sig Dispense Refill  . amLODipine (NORVASC) 10 MG tablet TAKE 1 TABLET BY MOUTH EVERY DAY 180 tablet 3  . aspirin 81 MG tablet Take 1 tablet (81 mg total) by mouth every evening. 30 tablet   . Carboxymethylcellul-Glycerin (LUBRICATING EYE DROPS OP) Place 1 drop into both eyes daily as needed (dry eyes).    . finasteride (PROSCAR) 5 MG tablet Take 5 mg by mouth every evening.     . metoprolol succinate (TOPROL-XL) 25 MG 24 hr tablet TAKE 1 TABLET BY MOUTH EVERY DAY 90 tablet 2  . Multiple Vitamin (MULTIVITAMIN) tablet Take 1 tablet by mouth every evening.     . multivitamin-lutein (OCUVITE-LUTEIN) CAPS capsule Take 1 capsule by mouth every evening.     . naproxen sodium (ALEVE) 220 MG tablet Take 440 mg by mouth daily as needed (pain).     . Omega-3 Fatty Acids (FISH OIL) 1200 MG CAPS Take 1,200 mg by mouth every evening.    . simvastatin (ZOCOR) 20 MG tablet Take 20 mg by mouth every evening.     . tamsulosin (FLOMAX)  0.4 MG CAPS capsule Take 0.4 mg by mouth every evening.      No current facility-administered medications for this visit.   Allergies:  Patient has no known allergies.   ROS: No syncope.  Physical Exam: VS:  BP 124/74   Pulse 81   Ht 6\' 4"  (1.93 m)   Wt 272 lb (123.4 kg)   BMI 33.11 kg/m , BMI Body mass index is 33.11 kg/m.  Wt Readings from Last 3 Encounters:  06/27/20 272 lb (123.4 kg)  08/16/19 275 lb (124.7 kg)  07/19/19 275 lb (124.7 kg)    General: Patient appears comfortable at rest. HEENT: Conjunctiva and lids normal, wearing a mask. Neck: No elevated JVP. Cardiac: Regular rate and rhythm, no S3, 2/6 systolic murmur, no  pericardial rub.  ECG:  An ECG dated 08/16/2019 was personally reviewed today and demonstrated:  Ventricular paced rhythm.  Recent Labwork: No results found for requested labs within last 8760 hours.     Component Value Date/Time   CHOL 147 03/16/2017 1300   TRIG 146 03/16/2017 1300   HDL 42 03/16/2017 1300   CHOLHDL 3.5 03/16/2017 1300   VLDL 29 03/16/2017 1300   LDLCALC 76 03/16/2017 1300    Other Studies Reviewed Today:  Echocardiogram 03/01/2020: 1. Left ventricular ejection fraction, by estimation, is 55 to 60%. The  left ventricle has normal function. Left ventricular endocardial border  not optimally defined to evaluate regional wall motion. There is mild left  ventricular hypertrophy. Left  ventricular diastolic parameters were normal.  2. Right ventricular systolic function is normal. The right ventricular  size is normal. There is normal pulmonary artery systolic pressure. The  estimated right ventricular systolic pressure is 21.1 mmHg.  3. The mitral valve is grossly normal, mildly thickened and with moderate  annular calcification. Trivial mitral valve regurgitation.  4. The aortic valve is tricuspid, moderately calcified and with reduced  left coronary cusp excursion. Aortic valve regurgitation is not  visualized. Mild aortic valve stenosis. Mean gradient 9.5 mmHg and valve  area 1.92 cm2.  5. The inferior vena cava is normal in size with greater than 50%  respiratory variability, suggesting right atrial pressure of 3 mmHg.   Assessment and Plan:  1.  Paroxysmal atrial tachycardia, doing well without active palpitations on Toprol-XL which we will continue at 25 mg daily for now.  2.  Mild calcific aortic stenosis, asymptomatic.  Echocardiogram from June noted above.  No change in examination today, continue observation.  3.  History of symptomatic bradycardia with second-degree type II heart block.  Medtronic pacemaker in place with follow-up by Dr. Rayann Heman.  Visit scheduled in December.  Medication Adjustments/Labs and Tests Ordered: Current medicines are reviewed at length with the patient today.  Concerns regarding medicines are outlined above.   Tests Ordered: Orders Placed This Encounter  Procedures  . EKG 12-Lead    Medication Changes: No orders of the defined types were placed in this encounter.   Disposition:  Follow up 6 months in the [place] office.  Signed, Satira Sark, MD, [facility] 06/27/2020 12:14 PM    [facility] at [place], [place], [place] [address] Phone: [phone]; Fax: [phone]

## 2020-06-27 NOTE — Patient Instructions (Addendum)

## 2020-06-28 ENCOUNTER — Other Ambulatory Visit: Payer: Self-pay | Admitting: Cardiology

## 2020-07-17 DIAGNOSIS — I10 Essential (primary) hypertension: Secondary | ICD-10-CM | POA: Diagnosis not present

## 2020-07-17 DIAGNOSIS — R7301 Impaired fasting glucose: Secondary | ICD-10-CM | POA: Diagnosis not present

## 2020-07-17 DIAGNOSIS — E782 Mixed hyperlipidemia: Secondary | ICD-10-CM | POA: Diagnosis not present

## 2020-07-17 DIAGNOSIS — Z1159 Encounter for screening for other viral diseases: Secondary | ICD-10-CM | POA: Diagnosis not present

## 2020-07-17 DIAGNOSIS — E7800 Pure hypercholesterolemia, unspecified: Secondary | ICD-10-CM | POA: Diagnosis not present

## 2020-07-20 DIAGNOSIS — E782 Mixed hyperlipidemia: Secondary | ICD-10-CM | POA: Diagnosis not present

## 2020-07-20 DIAGNOSIS — R002 Palpitations: Secondary | ICD-10-CM | POA: Diagnosis not present

## 2020-07-20 DIAGNOSIS — R7301 Impaired fasting glucose: Secondary | ICD-10-CM | POA: Diagnosis not present

## 2020-07-20 DIAGNOSIS — E7801 Familial hypercholesterolemia: Secondary | ICD-10-CM | POA: Diagnosis not present

## 2020-07-20 DIAGNOSIS — I10 Essential (primary) hypertension: Secondary | ICD-10-CM | POA: Diagnosis not present

## 2020-07-20 DIAGNOSIS — G619 Inflammatory polyneuropathy, unspecified: Secondary | ICD-10-CM | POA: Diagnosis not present

## 2020-07-20 DIAGNOSIS — N401 Enlarged prostate with lower urinary tract symptoms: Secondary | ICD-10-CM | POA: Diagnosis not present

## 2020-07-20 DIAGNOSIS — R7303 Prediabetes: Secondary | ICD-10-CM | POA: Diagnosis not present

## 2020-07-25 ENCOUNTER — Ambulatory Visit (INDEPENDENT_AMBULATORY_CARE_PROVIDER_SITE_OTHER): Payer: Medicare PPO

## 2020-07-25 DIAGNOSIS — I441 Atrioventricular block, second degree: Secondary | ICD-10-CM | POA: Diagnosis not present

## 2020-07-25 LAB — CUP PACEART REMOTE DEVICE CHECK
Battery Remaining Longevity: 19 mo
Battery Voltage: 2.91 V
Brady Statistic AP VP Percent: 46.6 %
Brady Statistic AP VS Percent: 0.01 %
Brady Statistic AS VP Percent: 53.16 %
Brady Statistic AS VS Percent: 0.24 %
Brady Statistic RA Percent Paced: 46.66 %
Brady Statistic RV Percent Paced: 99.75 %
Date Time Interrogation Session: 20211123021116
Implantable Lead Implant Date: 20180824
Implantable Lead Implant Date: 20180824
Implantable Lead Location: 753859
Implantable Lead Location: 753860
Implantable Lead Model: 5076
Implantable Lead Model: 5076
Implantable Pulse Generator Implant Date: 20180824
Lead Channel Impedance Value: 285 Ohm
Lead Channel Impedance Value: 285 Ohm
Lead Channel Impedance Value: 342 Ohm
Lead Channel Impedance Value: 342 Ohm
Lead Channel Pacing Threshold Amplitude: 0.375 V
Lead Channel Pacing Threshold Amplitude: 1.75 V
Lead Channel Pacing Threshold Pulse Width: 0.4 ms
Lead Channel Pacing Threshold Pulse Width: 0.4 ms
Lead Channel Sensing Intrinsic Amplitude: 18.75 mV
Lead Channel Sensing Intrinsic Amplitude: 18.75 mV
Lead Channel Sensing Intrinsic Amplitude: 3.25 mV
Lead Channel Sensing Intrinsic Amplitude: 3.25 mV
Lead Channel Setting Pacing Amplitude: 1.5 V
Lead Channel Setting Pacing Amplitude: 3.5 V
Lead Channel Setting Pacing Pulse Width: 1 ms
Lead Channel Setting Sensing Sensitivity: 0.9 mV

## 2020-08-01 NOTE — Progress Notes (Signed)
Remote pacemaker transmission.   

## 2020-08-11 ENCOUNTER — Other Ambulatory Visit: Payer: Self-pay

## 2020-08-11 ENCOUNTER — Encounter: Payer: Self-pay | Admitting: Internal Medicine

## 2020-08-11 ENCOUNTER — Ambulatory Visit: Payer: Medicare PPO | Admitting: Internal Medicine

## 2020-08-11 VITALS — BP 148/80 | HR 67 | Ht 76.0 in | Wt 275.2 lb

## 2020-08-11 DIAGNOSIS — I10 Essential (primary) hypertension: Secondary | ICD-10-CM

## 2020-08-11 DIAGNOSIS — I441 Atrioventricular block, second degree: Secondary | ICD-10-CM

## 2020-08-11 LAB — CUP PACEART INCLINIC DEVICE CHECK
Battery Remaining Longevity: 18 mo
Battery Voltage: 2.91 V
Brady Statistic AP VP Percent: 50.52 %
Brady Statistic AP VS Percent: 2.06 %
Brady Statistic AS VP Percent: 46.34 %
Brady Statistic AS VS Percent: 1.08 %
Brady Statistic RA Percent Paced: 52.67 %
Brady Statistic RV Percent Paced: 96.86 %
Date Time Interrogation Session: 20211210141200
Implantable Lead Implant Date: 20180824
Implantable Lead Implant Date: 20180824
Implantable Lead Location: 753859
Implantable Lead Location: 753860
Implantable Lead Model: 5076
Implantable Lead Model: 5076
Implantable Pulse Generator Implant Date: 20180824
Lead Channel Impedance Value: 304 Ohm
Lead Channel Impedance Value: 323 Ohm
Lead Channel Impedance Value: 361 Ohm
Lead Channel Impedance Value: 380 Ohm
Lead Channel Pacing Threshold Amplitude: 0.5 V
Lead Channel Pacing Threshold Amplitude: 1.5 V
Lead Channel Pacing Threshold Pulse Width: 0.4 ms
Lead Channel Pacing Threshold Pulse Width: 1 ms
Lead Channel Sensing Intrinsic Amplitude: 19.75 mV
Lead Channel Sensing Intrinsic Amplitude: 3.75 mV
Lead Channel Setting Pacing Amplitude: 1.5 V
Lead Channel Setting Pacing Amplitude: 3 V
Lead Channel Setting Pacing Pulse Width: 1 ms
Lead Channel Setting Sensing Sensitivity: 0.9 mV

## 2020-08-11 NOTE — Progress Notes (Signed)
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  Since last being seen in our clinic, the patient reports doing very well.  Today, he denies symptoms of palpitations, chest pain, shortness of breath,  lower extremity edema, dizziness, presyncope, or syncope.  The patient is otherwise without complaint today.   Past Medical History:  Diagnosis Date  . Arthritis   . Benign prostatic hyperplasia   . Essential hypertension   . Hemorrhoid   . Hypercholesterolemia   . RBBB   . Second degree heart block    Medtronic pacemaker -Dr. Rayann Heman  . SVT (supraventricular tachycardia) (HCC)    Atrial tachycardia   Past Surgical History:  Procedure Laterality Date  . COLONOSCOPY N/A 03/24/2013   Procedure: COLONOSCOPY;  Surgeon: Rogene Houston, MD;  [facility];  Service: Endoscopy;  Laterality: N/A;  830  . COLONOSCOPY N/A 08/05/2018   Procedure: COLONOSCOPY;  Surgeon: Rogene Houston, MD;  [facility];  Service: Endoscopy;  Laterality: N/A;  12  . LAMINECTOMY     Lumbar  . PACEMAKER IMPLANT N/A 04/25/2017   MDT Azure XT DR MRI implanted by Dr Rayann Heman for second degree AV block  . POLYPECTOMY  08/05/2018   Procedure: POLYPECTOMY;  Surgeon: Rogene Houston, MD;  [facility];  Service: Endoscopy;;  ascending polyp, splenic flexure polyp, sigmoid polyp  . PROSTATE BIOPSY    . THYROID SURGERY  1999   L lobectomy, complicated by esophageal laceration requiring esophageal repair     ROS- all systems are reviewed and negative except as per HPI above  Current Outpatient Medications  Medication Sig Dispense Refill  . amLODipine (NORVASC) 10 MG tablet TAKE 1 TABLET BY MOUTH EVERY DAY 180 tablet 3  . aspirin 81 MG tablet Take 1 tablet (81 mg total) by mouth every evening. 30 tablet   . Carboxymethylcellul-Glycerin (LUBRICATING EYE DROPS OP) Place 1 drop into both eyes  daily as needed (dry eyes).    . Cholecalciferol (VITAMIN D3) 25 MCG (1000 UT) CAPS Take 1,000 Units by mouth daily.    . finasteride (PROSCAR) 5 MG tablet Take 5 mg by mouth every evening.     . metoprolol succinate (TOPROL-XL) 25 MG 24 hr tablet TAKE 1 TABLET(25 MG) BY MOUTH DAILY 90 tablet 2  . Multiple Vitamin (MULTIVITAMIN) tablet Take 1 tablet by mouth every evening.     . multivitamin-lutein (OCUVITE-LUTEIN) CAPS capsule Take 1 capsule by mouth every evening.     . naproxen sodium (ALEVE) 220 MG tablet Take 440 mg by mouth daily as needed (pain).     . Omega-3 Fatty Acids (FISH OIL) 1200 MG CAPS Take 1,200 mg by mouth every evening.    . simvastatin (ZOCOR) 20 MG tablet Take 20 mg by mouth every evening.     . tamsulosin (FLOMAX) 0.4 MG CAPS capsule Take 0.4 mg by mouth every evening.      No current facility-administered medications for this visit.    Physical Exam: Vitals:   08/11/20 1400  BP: (!) 148/80  Pulse: 67  SpO2: 96%  Weight: 275 lb 3.2 oz (124.8 kg)  Height: 6\' 4"  (1.93 m)    GEN- The patient is well appearing, alert and oriented x 3 today.   Head- normocephalic, atraumatic Eyes-  Sclera clear, conjunctiva pink Ears- hearing intact Oropharynx- clear Lungs- Clear to  ausculation bilaterally, normal work of breathing Chest- pacemaker pocket is well healed Heart- Regular rate and rhythm, no murmurs, rubs or gallops, PMI not laterally displaced GI- soft, NT, ND, + BS Extremities- no clubbing, cyanosis, or edema  Pacemaker interrogation- reviewed in detail today,  See PACEART report  ekg tracing ordered today is personally reviewed and shows sinus with RV pacing  Assessment and Plan:  1. Symptomatic second degree heart block Normal pacemaker function See Pace Art report No changes today he is device dependant today RV lead threshold is chronically elevated (since 2018).  The patient, his wife, and I discussed at length today.  As threshold is stable, we will  continue to follow.  2. HTN Stable No change required today   Risks, benefits and potential toxicities for medications prescribed and/or refilled reviewed with patient today.   Return to see me in 6 months  Thompson Grayer MD, [facility] 08/11/2020 2:35 PM

## 2020-08-11 NOTE — Patient Instructions (Addendum)
Medication Instructions:  Your physician recommends that you continue on your current medications as directed. Please refer to the Current Medication list given to you today.  *If you need a refill on your cardiac medications before your next appointment, please call your pharmacy*  Lab Work: None ordered.  If you have labs (blood work) drawn today and your tests are completely normal, you will receive your results only by: Marland Kitchen MyChart Message (if you have MyChart) OR . A paper copy in the mail If you have any lab test that is abnormal or we need to change your treatment, we will call you to review the results.  Testing/Procedures: None ordered.  Follow-Up: At [facility], you and your health needs are our priority.  As part of our continuing mission to provide you with exceptional heart care, we have created designated Provider Care Teams.  These Care Teams include your primary Cardiologist (physician) and Advanced Practice Providers (APPs -  Physician Assistants and Nurse Practitioners) who all work together to provide you with the care you need, when you need it.  We recommend signing up for the patient portal called "MyChart".  Sign up information is provided on this After Visit Summary.  MyChart is used to connect with patients for Virtual Visits (Telemedicine).  Patients are able to view lab/test results, encounter notes, upcoming appointments, etc.  Non-urgent messages can be sent to your provider as well.   To learn more about what you can do with MyChart, go to [email or web address].    Your next appointment:   Your physician wants you to follow-up in: 02/07/21 at 1:45 pm with Dr. Rayann Heman.   Remote monitoring is used to monitor your Pacemaker from home. This monitoring reduces the number of office visits required to check your device to one time per year. It allows [place] to keep an eye on the functioning of your device to ensure it is working properly. You are scheduled for a device  check from home on 10/24/20. You may send your transmission at any time that day. If you have a wireless device, the transmission will be sent automatically. After your physician reviews your transmission, you will receive a postcard with your next transmission date.  Other Instructions:

## 2020-08-21 DIAGNOSIS — N138 Other obstructive and reflux uropathy: Secondary | ICD-10-CM | POA: Diagnosis not present

## 2020-08-21 DIAGNOSIS — N529 Male erectile dysfunction, unspecified: Secondary | ICD-10-CM | POA: Diagnosis not present

## 2020-08-21 DIAGNOSIS — N401 Enlarged prostate with lower urinary tract symptoms: Secondary | ICD-10-CM | POA: Diagnosis not present

## 2020-08-21 DIAGNOSIS — R972 Elevated prostate specific antigen [PSA]: Secondary | ICD-10-CM | POA: Diagnosis not present

## 2020-09-04 DIAGNOSIS — Z20822 Contact with and (suspected) exposure to covid-19: Secondary | ICD-10-CM | POA: Diagnosis not present

## 2020-10-24 ENCOUNTER — Ambulatory Visit (INDEPENDENT_AMBULATORY_CARE_PROVIDER_SITE_OTHER): Payer: Medicare PPO

## 2020-10-24 DIAGNOSIS — I441 Atrioventricular block, second degree: Secondary | ICD-10-CM

## 2020-10-25 LAB — CUP PACEART REMOTE DEVICE CHECK
Battery Remaining Longevity: 22 mo
Battery Voltage: 2.91 V
Brady Statistic AP VP Percent: 47.44 %
Brady Statistic AP VS Percent: 0 %
Brady Statistic AS VP Percent: 52.49 %
Brady Statistic AS VS Percent: 0.07 %
Brady Statistic RA Percent Paced: 47.44 %
Brady Statistic RV Percent Paced: 99.93 %
Date Time Interrogation Session: 20220222154425
Implantable Lead Implant Date: 20180824
Implantable Lead Implant Date: 20180824
Implantable Lead Location: 753859
Implantable Lead Location: 753860
Implantable Lead Model: 5076
Implantable Lead Model: 5076
Implantable Pulse Generator Implant Date: 20180824
Lead Channel Impedance Value: 285 Ohm
Lead Channel Impedance Value: 304 Ohm
Lead Channel Impedance Value: 361 Ohm
Lead Channel Impedance Value: 361 Ohm
Lead Channel Pacing Threshold Amplitude: 0.5 V
Lead Channel Pacing Threshold Amplitude: 2.375 V
Lead Channel Pacing Threshold Pulse Width: 0.4 ms
Lead Channel Pacing Threshold Pulse Width: 0.4 ms
Lead Channel Sensing Intrinsic Amplitude: 3.125 mV
Lead Channel Sensing Intrinsic Amplitude: 3.125 mV
Lead Channel Sensing Intrinsic Amplitude: 9.375 mV
Lead Channel Sensing Intrinsic Amplitude: 9.375 mV
Lead Channel Setting Pacing Amplitude: 1.5 V
Lead Channel Setting Pacing Amplitude: 3 V
Lead Channel Setting Pacing Pulse Width: 1 ms
Lead Channel Setting Sensing Sensitivity: 0.9 mV

## 2020-11-01 NOTE — Progress Notes (Signed)
Remote pacemaker transmission.   

## 2020-12-18 DIAGNOSIS — H25013 Cortical age-related cataract, bilateral: Secondary | ICD-10-CM | POA: Diagnosis not present

## 2020-12-18 DIAGNOSIS — H2513 Age-related nuclear cataract, bilateral: Secondary | ICD-10-CM | POA: Diagnosis not present

## 2020-12-18 DIAGNOSIS — H04123 Dry eye syndrome of bilateral lacrimal glands: Secondary | ICD-10-CM | POA: Diagnosis not present

## 2020-12-18 DIAGNOSIS — H353131 Nonexudative age-related macular degeneration, bilateral, early dry stage: Secondary | ICD-10-CM | POA: Diagnosis not present

## 2020-12-18 DIAGNOSIS — M25569 Pain in unspecified knee: Secondary | ICD-10-CM | POA: Diagnosis not present

## 2020-12-18 DIAGNOSIS — Z6833 Body mass index (BMI) 33.0-33.9, adult: Secondary | ICD-10-CM | POA: Diagnosis not present

## 2021-01-03 ENCOUNTER — Encounter: Payer: Self-pay | Admitting: Cardiology

## 2021-01-03 ENCOUNTER — Ambulatory Visit: Payer: Medicare PPO | Admitting: Cardiology

## 2021-01-03 VITALS — BP 128/82 | HR 65 | Ht 76.0 in | Wt 274.2 lb

## 2021-01-03 DIAGNOSIS — I441 Atrioventricular block, second degree: Secondary | ICD-10-CM

## 2021-01-03 DIAGNOSIS — I350 Nonrheumatic aortic (valve) stenosis: Secondary | ICD-10-CM | POA: Diagnosis not present

## 2021-01-03 DIAGNOSIS — I471 Supraventricular tachycardia: Secondary | ICD-10-CM

## 2021-01-03 NOTE — Progress Notes (Signed)
Cardiology Office Note  Date: 01/03/2021   ID: Jonathan Lamb, DOB [birth year], MRN [id number]  PCP:  Manon Hilding, MD  Cardiologist:  Rozann Lesches, MD Electrophysiologist:  None   Chief Complaint  Patient presents with  . Cardiac follow-up    History of Present Illness: Jonathan Lamb is a 75 y.o. male last seen in October 2021.  He presents for a routine follow-up visit.  States that he has been doing well, no significant palpitations or worsening dyspnea on exertion.  He and his wife just got back from a trip to [facility].  He continues to follow with Dr. Rayann Heman, Medtronic pacemaker in place.  He has a history of second-degree type II heart block and also atrial tachycardia.  Device interrogation February revealed normal function.  He anticipates generator change out in the next year.  I reviewed his medications which are stable from a cardiac perspective and outlined below.  He continues to follow with Dr. Quintin Alto.  Past Medical History:  Diagnosis Date  . Arthritis   . Benign prostatic hyperplasia   . Essential hypertension   . Hemorrhoid   . Hypercholesterolemia   . RBBB   . Second degree heart block    Medtronic pacemaker -Dr. Rayann Heman  . SVT (supraventricular tachycardia) (HCC)    Atrial tachycardia    Past Surgical History:  Procedure Laterality Date  . COLONOSCOPY N/A 03/24/2013   Procedure: COLONOSCOPY;  Surgeon: Rogene Houston, MD;  [facility];  Service: Endoscopy;  Laterality: N/A;  830  . COLONOSCOPY N/A 08/05/2018   Procedure: COLONOSCOPY;  Surgeon: Rogene Houston, MD;  [facility];  Service: Endoscopy;  Laterality: N/A;  12  . LAMINECTOMY     Lumbar  . PACEMAKER IMPLANT N/A 04/25/2017   MDT Azure XT DR MRI implanted by Dr Rayann Heman for second degree AV block  . POLYPECTOMY  08/05/2018   Procedure: POLYPECTOMY;  Surgeon: Rogene Houston, MD;  [facility];  Service: Endoscopy;;  ascending polyp, splenic flexure  polyp, sigmoid polyp  . PROSTATE BIOPSY    . THYROID SURGERY  1999   L lobectomy, complicated by esophageal laceration requiring esophageal repair     Current Outpatient Medications  Medication Sig Dispense Refill  . amLODipine (NORVASC) 10 MG tablet TAKE 1 TABLET BY MOUTH EVERY DAY 180 tablet 3  . aspirin 81 MG tablet Take 1 tablet (81 mg total) by mouth every evening. 30 tablet   . Carboxymethylcellul-Glycerin (LUBRICATING EYE DROPS OP) Place 1 drop into both eyes daily as needed (dry eyes).    . Cholecalciferol (VITAMIN D3) 25 MCG (1000 UT) CAPS Take 1,000 Units by mouth daily.    . finasteride (PROSCAR) 5 MG tablet Take 5 mg by mouth every evening.     . metoprolol succinate (TOPROL-XL) 25 MG 24 hr tablet TAKE 1 TABLET(25 MG) BY MOUTH DAILY 90 tablet 2  . Multiple Vitamin (MULTIVITAMIN) tablet Take 1 tablet by mouth every evening.     . multivitamin-lutein (OCUVITE-LUTEIN) CAPS capsule Take 1 capsule by mouth every evening.     . naproxen sodium (ALEVE) 220 MG tablet Take 440 mg by mouth daily as needed (pain).     . Omega-3 Fatty Acids (FISH OIL) 1200 MG CAPS Take 1,200 mg by mouth every evening.    . simvastatin (ZOCOR) 20 MG tablet Take 20 mg by mouth every evening.     . tamsulosin (FLOMAX) 0.4 MG CAPS capsule Take  0.4 mg by mouth every evening.      No current facility-administered medications for this visit.   Allergies:  Patient has no known allergies.   ROS: No sudden dizziness or syncope.  Physical Exam: VS:  BP 128/82   Pulse 65   Ht 6\' 4"  (1.93 m)   Wt 274 lb 3.2 oz (124.4 kg)   SpO2 95%   BMI 33.38 kg/m , BMI Body mass index is 33.38 kg/m.  Wt Readings from Last 3 Encounters:  01/03/21 274 lb 3.2 oz (124.4 kg)  08/11/20 275 lb 3.2 oz (124.8 kg)  06/27/20 272 lb (123.4 kg)    General: Patient appears comfortable at rest. HEENT: Conjunctiva and lids normal, wearing a mask. Neck: Supple, no elevated JVP. Lungs: Clear to auscultation, nonlabored breathing at  rest. Cardiac: Regular rate and rhythm, no S3, 2/6 basal systolic murmur, no pericardial rub. Extremities: No pitting edema.  ECG:  An ECG dated 08/11/2020 was personally reviewed today and demonstrated:  Ventricular pacing with atrial sensing.  Recent Labwork:  No interval lab work for review today.  Other Studies Reviewed Today:  Echocardiogram 03/01/2020: 1. Left ventricular ejection fraction, by estimation, is 55 to 60%. The  left ventricle has normal function. Left ventricular endocardial border  not optimally defined to evaluate regional wall motion. There is mild left  ventricular hypertrophy. Left  ventricular diastolic parameters were normal.  2. Right ventricular systolic function is normal. The right ventricular  size is normal. There is normal pulmonary artery systolic pressure. The  estimated right ventricular systolic pressure is 16.1 mmHg.  3. The mitral valve is grossly normal, mildly thickened and with moderate  annular calcification. Trivial mitral valve regurgitation.  4. The aortic valve is tricuspid, moderately calcified and with reduced  left coronary cusp excursion. Aortic valve regurgitation is not  visualized. Mild aortic valve stenosis. Mean gradient 9.5 mmHg and valve  area 1.92 cm2.  5. The inferior vena cava is normal in size with greater than 50%  respiratory variability, suggesting right atrial pressure of 3 mmHg.   Assessment and Plan:  1.  Paroxysmal atrial tachycardia.  No significant palpitations at this time, tolerating Toprol-XL.  Continue observation.  2.  Second-degree type II heart block status post Medtronic pacemaker.  Continue to follow regularly with Dr. Rayann Heman.  Last device interrogation revealed normal function.  He anticipates generator change out within the next year.  3.  Calcific aortic stenosis, mild by echocardiogram in June 2021.  No significant change in murmur.  Continue observation.  Medication Adjustments/Labs and  Tests Ordered: Current medicines are reviewed at length with the patient today.  Concerns regarding medicines are outlined above.   Tests Ordered: No orders of the defined types were placed in this encounter.   Medication Changes: No orders of the defined types were placed in this encounter.   Disposition:  Follow up 6 months.  Signed, Satira Sark, MD, [facility] 01/03/2021 9:39 AM    [facility] at [place], [place], [place] [address] Phone: [phone]; Fax: [phone]

## 2021-01-03 NOTE — Patient Instructions (Addendum)

## 2021-01-11 DIAGNOSIS — R7303 Prediabetes: Secondary | ICD-10-CM | POA: Diagnosis not present

## 2021-01-11 DIAGNOSIS — I10 Essential (primary) hypertension: Secondary | ICD-10-CM | POA: Diagnosis not present

## 2021-01-11 DIAGNOSIS — E782 Mixed hyperlipidemia: Secondary | ICD-10-CM | POA: Diagnosis not present

## 2021-01-11 DIAGNOSIS — E7849 Other hyperlipidemia: Secondary | ICD-10-CM | POA: Diagnosis not present

## 2021-01-11 DIAGNOSIS — E7801 Familial hypercholesterolemia: Secondary | ICD-10-CM | POA: Diagnosis not present

## 2021-01-11 DIAGNOSIS — R7301 Impaired fasting glucose: Secondary | ICD-10-CM | POA: Diagnosis not present

## 2021-01-11 DIAGNOSIS — E7800 Pure hypercholesterolemia, unspecified: Secondary | ICD-10-CM | POA: Diagnosis not present

## 2021-01-15 DIAGNOSIS — R7303 Prediabetes: Secondary | ICD-10-CM | POA: Diagnosis not present

## 2021-01-15 DIAGNOSIS — R7301 Impaired fasting glucose: Secondary | ICD-10-CM | POA: Diagnosis not present

## 2021-01-15 DIAGNOSIS — G619 Inflammatory polyneuropathy, unspecified: Secondary | ICD-10-CM | POA: Diagnosis not present

## 2021-01-15 DIAGNOSIS — E7801 Familial hypercholesterolemia: Secondary | ICD-10-CM | POA: Diagnosis not present

## 2021-01-15 DIAGNOSIS — I10 Essential (primary) hypertension: Secondary | ICD-10-CM | POA: Diagnosis not present

## 2021-01-15 DIAGNOSIS — R002 Palpitations: Secondary | ICD-10-CM | POA: Diagnosis not present

## 2021-01-15 DIAGNOSIS — N401 Enlarged prostate with lower urinary tract symptoms: Secondary | ICD-10-CM | POA: Diagnosis not present

## 2021-01-15 DIAGNOSIS — E7849 Other hyperlipidemia: Secondary | ICD-10-CM | POA: Diagnosis not present

## 2021-01-23 ENCOUNTER — Ambulatory Visit (INDEPENDENT_AMBULATORY_CARE_PROVIDER_SITE_OTHER): Payer: Medicare PPO

## 2021-01-23 DIAGNOSIS — I441 Atrioventricular block, second degree: Secondary | ICD-10-CM

## 2021-01-23 LAB — CUP PACEART REMOTE DEVICE CHECK
Battery Remaining Longevity: 19 mo
Battery Voltage: 2.9 V
Brady Statistic AP VP Percent: 50.14 %
Brady Statistic AP VS Percent: 0 %
Brady Statistic AS VP Percent: 49.83 %
Brady Statistic AS VS Percent: 0.03 %
Brady Statistic RA Percent Paced: 50.14 %
Brady Statistic RV Percent Paced: 99.97 %
Date Time Interrogation Session: 20220523235358
Implantable Lead Implant Date: 20180824
Implantable Lead Implant Date: 20180824
Implantable Lead Location: 753859
Implantable Lead Location: 753860
Implantable Lead Model: 5076
Implantable Lead Model: 5076
Implantable Pulse Generator Implant Date: 20180824
Lead Channel Impedance Value: 266 Ohm
Lead Channel Impedance Value: 266 Ohm
Lead Channel Impedance Value: 323 Ohm
Lead Channel Impedance Value: 342 Ohm
Lead Channel Pacing Threshold Amplitude: 0.375 V
Lead Channel Pacing Threshold Amplitude: 2 V
Lead Channel Pacing Threshold Pulse Width: 0.4 ms
Lead Channel Pacing Threshold Pulse Width: 0.4 ms
Lead Channel Sensing Intrinsic Amplitude: 2.875 mV
Lead Channel Sensing Intrinsic Amplitude: 2.875 mV
Lead Channel Sensing Intrinsic Amplitude: 7.875 mV
Lead Channel Sensing Intrinsic Amplitude: 7.875 mV
Lead Channel Setting Pacing Amplitude: 1.5 V
Lead Channel Setting Pacing Amplitude: 3 V
Lead Channel Setting Pacing Pulse Width: 1 ms
Lead Channel Setting Sensing Sensitivity: 0.9 mV

## 2021-02-07 ENCOUNTER — Encounter: Payer: Self-pay | Admitting: Internal Medicine

## 2021-02-07 ENCOUNTER — Ambulatory Visit: Payer: Medicare PPO | Admitting: Internal Medicine

## 2021-02-07 ENCOUNTER — Other Ambulatory Visit: Payer: Self-pay

## 2021-02-07 VITALS — BP 120/78 | HR 65 | Ht 76.0 in

## 2021-02-07 DIAGNOSIS — I441 Atrioventricular block, second degree: Secondary | ICD-10-CM | POA: Diagnosis not present

## 2021-02-07 DIAGNOSIS — I10 Essential (primary) hypertension: Secondary | ICD-10-CM | POA: Diagnosis not present

## 2021-02-07 NOTE — Progress Notes (Signed)
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  Since last being seen in our clinic, the patient reports doing very well.  Today, he denies symptoms of palpitations, chest pain, shortness of breath,  lower extremity edema, dizziness, presyncope, or syncope.  Activity is limited by knee pain/ DJD.  Not very active currently.  The patient is otherwise without complaint today.   Past Medical History:  Diagnosis Date  . Arthritis   . Benign prostatic hyperplasia   . Essential hypertension   . Hemorrhoid   . Hypercholesterolemia   . RBBB   . Second degree heart block    Medtronic pacemaker -Dr. Rayann Heman  . SVT (supraventricular tachycardia) (HCC)    Atrial tachycardia   Past Surgical History:  Procedure Laterality Date  . COLONOSCOPY N/A 03/24/2013   Procedure: COLONOSCOPY;  Surgeon: Rogene Houston, MD;  [facility];  Service: Endoscopy;  Laterality: N/A;  830  . COLONOSCOPY N/A 08/05/2018   Procedure: COLONOSCOPY;  Surgeon: Rogene Houston, MD;  [facility];  Service: Endoscopy;  Laterality: N/A;  12  . LAMINECTOMY     Lumbar  . PACEMAKER IMPLANT N/A 04/25/2017   MDT Azure XT DR MRI implanted by Dr Rayann Heman for second degree AV block  . POLYPECTOMY  08/05/2018   Procedure: POLYPECTOMY;  Surgeon: Rogene Houston, MD;  [facility];  Service: Endoscopy;;  ascending polyp, splenic flexure polyp, sigmoid polyp  . PROSTATE BIOPSY    . THYROID SURGERY  1999   L lobectomy, complicated by esophageal laceration requiring esophageal repair     ROS- all systems are reviewed and negative except as per HPI above  Current Outpatient Medications  Medication Sig Dispense Refill  . amLODipine (NORVASC) 10 MG tablet TAKE 1 TABLET BY MOUTH EVERY DAY 180 tablet 3  . aspirin 81 MG tablet Take 1 tablet (81 mg total) by mouth every evening. 30 tablet   .  Carboxymethylcellul-Glycerin (LUBRICATING EYE DROPS OP) Place 1 drop into both eyes daily as needed (dry eyes).    . Cholecalciferol (VITAMIN D3) 25 MCG (1000 UT) CAPS Take 1,000 Units by mouth daily.    . finasteride (PROSCAR) 5 MG tablet Take 5 mg by mouth every evening.     . metoprolol succinate (TOPROL-XL) 25 MG 24 hr tablet TAKE 1 TABLET(25 MG) BY MOUTH DAILY 90 tablet 2  . Multiple Vitamin (MULTIVITAMIN) tablet Take 1 tablet by mouth every evening.     . multivitamin-lutein (OCUVITE-LUTEIN) CAPS capsule Take 1 capsule by mouth every evening.     . naproxen sodium (ALEVE) 220 MG tablet Take 440 mg by mouth daily as needed (pain).     . Omega-3 Fatty Acids (FISH OIL) 1200 MG CAPS Take 1,200 mg by mouth every evening.    . simvastatin (ZOCOR) 20 MG tablet Take 20 mg by mouth every evening.     . tamsulosin (FLOMAX) 0.4 MG CAPS capsule Take 0.4 mg by mouth every evening.      No current facility-administered medications for this visit.    Physical Exam: Vitals:   02/07/21 1404  BP: 120/78  Pulse: 65  SpO2: 96%  Height: 6\' 4"  (1.93 m)    GEN- The patient is well appearing, alert and oriented x 3 today.   Head- normocephalic, atraumatic Eyes-  Sclera clear, conjunctiva pink Ears- hearing intact Oropharynx-  clear Lungs-   normal work of breathing Chest- pacemaker pocket is well healed Heart- Regular rate and rhythm  GI- soft  Extremities- no clubbing, cyanosis, or edema  Pacemaker interrogation- reviewed in detail today,  See PACEART report  ekg tracing ordered today is personally reviewed and shows sinus with first degree AV block, V paced  Assessment and Plan:  1. Symptomatic complete heart block Normal pacemaker function See Pace Art report RV threshold tdoay is chronically elevated but stable.  Today, threshold is 1.5V @1  msec.  He is programmed 3V@1msec  he is device dependant today I have turned off MVP and fixed AV delays at 200/180 msec We will plan lead revision  at time of generator change unless his lead becomes unstable in the interim.  2. HTN Stable No change required today  3. HL Continue statin at current dose  Risks, benefits and potential toxicities for medications prescribed and/or refilled reviewed with patient today.   Return to see EP PA in 6 months  Thompson Grayer MD, [facility] 02/07/2021 2:24 PM

## 2021-02-07 NOTE — Patient Instructions (Addendum)
Medication Instructions:  Your physician recommends that you continue on your current medications as directed. Please refer to the Current Medication list given to you today.  Labwork: None ordered.  Testing/Procedures: None ordered.  Follow-Up: Your physician wants you to follow-up in: 6 months with Thompson Grayer, MD or one of the following Advanced Practice Providers on your designated Care Team:      Tommye Standard, PA-C     You will receive a reminder letter in the mail two months in advance. If you don't receive a letter, please call our office to schedule the follow-up appointment.  Remote monitoring is used to monitor your Pacemaker from home. This monitoring reduces the number of office visits required to check your device to one time per year. It allows [place] to keep an eye on the functioning of your device to ensure it is working properly. You are scheduled for a device check from home on 04/24/21. You may send your transmission at any time that day. If you have a wireless device, the transmission will be sent automatically. After your physician reviews your transmission, you will receive a postcard with your next transmission date.  Any Other Special Instructions Will Be Listed Below (If Applicable).  If you need a refill on your cardiac medications before your next appointment, please call your pharmacy.

## 2021-02-14 NOTE — Progress Notes (Signed)
Remote pacemaker transmission.   

## 2021-03-22 DIAGNOSIS — N138 Other obstructive and reflux uropathy: Secondary | ICD-10-CM | POA: Diagnosis not present

## 2021-03-22 DIAGNOSIS — R972 Elevated prostate specific antigen [PSA]: Secondary | ICD-10-CM | POA: Diagnosis not present

## 2021-03-22 DIAGNOSIS — N401 Enlarged prostate with lower urinary tract symptoms: Secondary | ICD-10-CM | POA: Diagnosis not present

## 2021-03-22 DIAGNOSIS — N529 Male erectile dysfunction, unspecified: Secondary | ICD-10-CM | POA: Diagnosis not present

## 2021-04-24 ENCOUNTER — Ambulatory Visit (INDEPENDENT_AMBULATORY_CARE_PROVIDER_SITE_OTHER): Payer: Medicare PPO

## 2021-04-24 DIAGNOSIS — I441 Atrioventricular block, second degree: Secondary | ICD-10-CM | POA: Diagnosis not present

## 2021-04-24 LAB — CUP PACEART REMOTE DEVICE CHECK
Battery Remaining Longevity: 15 mo
Battery Voltage: 2.89 V
Brady Statistic AP VP Percent: 42.89 %
Brady Statistic AP VS Percent: 0 %
Brady Statistic AS VP Percent: 57.1 %
Brady Statistic AS VS Percent: 0.02 %
Brady Statistic RA Percent Paced: 42.87 %
Brady Statistic RV Percent Paced: 99.98 %
Date Time Interrogation Session: 20220823080845
Implantable Lead Implant Date: 20180824
Implantable Lead Implant Date: 20180824
Implantable Lead Location: 753859
Implantable Lead Location: 753860
Implantable Lead Model: 5076
Implantable Lead Model: 5076
Implantable Pulse Generator Implant Date: 20180824
Lead Channel Impedance Value: 285 Ohm
Lead Channel Impedance Value: 285 Ohm
Lead Channel Impedance Value: 342 Ohm
Lead Channel Impedance Value: 361 Ohm
Lead Channel Pacing Threshold Amplitude: 0.5 V
Lead Channel Pacing Threshold Amplitude: 2.25 V
Lead Channel Pacing Threshold Pulse Width: 0.4 ms
Lead Channel Pacing Threshold Pulse Width: 0.4 ms
Lead Channel Sensing Intrinsic Amplitude: 3.625 mV
Lead Channel Sensing Intrinsic Amplitude: 3.625 mV
Lead Channel Sensing Intrinsic Amplitude: 8.5 mV
Lead Channel Sensing Intrinsic Amplitude: 8.5 mV
Lead Channel Setting Pacing Amplitude: 1.5 V
Lead Channel Setting Pacing Amplitude: 3 V
Lead Channel Setting Pacing Pulse Width: 1 ms
Lead Channel Setting Sensing Sensitivity: 0.9 mV

## 2021-05-09 NOTE — Progress Notes (Signed)
Remote pacemaker transmission.   

## 2021-07-09 NOTE — Progress Notes (Signed)
Cardiology Office Note  Date: 07/10/2021   ID: Jonathan Lamb, DOB Sep 05, 1945, MRN [id number]  PCP:  Manon Hilding, MD  Cardiologist:  Rozann Lesches, MD Electrophysiologist:  None   Chief Complaint  Patient presents with   Cardiac follow-up     History of Present Illness: Jonathan Lamb is a 75 y.o. male last seen in May.  He is here with his wife for a follow-up visit.  Reports no chest pain or palpitations.  They have been walking about a mile at a time, four days a week.  They plan to take a trip to [place] in February.  He has a Medtronic pacemaker in place with follow-up by Dr. Rayann Heman.  History of second-degree type II heart block and atrial tachycardia.  Device check in August indicated normal function (15 month battery longevity).  I went over his medications which are stable from a cardiac perspective and outlined below.  Past Medical History:  Diagnosis Date   Arthritis    Benign prostatic hyperplasia    Essential hypertension    Hemorrhoid    Hypercholesterolemia    RBBB    Second degree heart block    Medtronic pacemaker -Dr. Rayann Heman   SVT [facility]supraventricular tachycardia) [facility])    Atrial tachycardia    Past Surgical History:  Procedure Laterality Date   COLONOSCOPY N/A 03/24/2013   Procedure: COLONOSCOPY;  Surgeon: Rogene Houston, MD;  [facility];  Service: Endoscopy;  Laterality: N/A;  830   COLONOSCOPY N/A 08/05/2018   Procedure: COLONOSCOPY;  Surgeon: Rogene Houston, MD;  [facility];  Service: Endoscopy;  Laterality: N/A;  12   LAMINECTOMY     Lumbar   PACEMAKER IMPLANT N/A 04/25/2017   MDT [place] XT DR MRI implanted by Dr Rayann Heman for second degree AV block   POLYPECTOMY  08/05/2018   Procedure: POLYPECTOMY;  Surgeon: Rogene Houston, MD;  [facility];  Service: Endoscopy;;  ascending polyp, splenic flexure polyp, sigmoid polyp   PROSTATE BIOPSY     THYROID SURGERY  1999   L lobectomy, complicated by  esophageal laceration requiring esophageal repair     Current Outpatient Medications  Medication Sig Dispense Refill   amLODipine (NORVASC) 10 MG tablet TAKE 1 TABLET BY MOUTH EVERY DAY 180 tablet 3   aspirin 81 MG tablet Take 1 tablet (81 mg total) by mouth every evening. 30 tablet    Carboxymethylcellul-Glycerin (LUBRICATING EYE DROPS OP) Place 1 drop into both eyes daily as needed (dry eyes).     Cholecalciferol (VITAMIN D3) 25 MCG (1000 UT) CAPS Take 1,000 Units by mouth daily.     finasteride (PROSCAR) 5 MG tablet Take 5 mg by mouth every evening.      metoprolol succinate (TOPROL-XL) 25 MG 24 hr tablet TAKE 1 TABLET(25 MG) BY MOUTH DAILY 90 tablet 2   Multiple Vitamin (MULTIVITAMIN) tablet Take 1 tablet by mouth every evening.      multivitamin-lutein (OCUVITE-LUTEIN) CAPS capsule Take 1 capsule by mouth every evening.      naproxen sodium (ALEVE) 220 MG tablet Take 440 mg by mouth daily as needed (pain).      Omega-3 Fatty Acids (FISH OIL) 1200 MG CAPS Take 1,200 mg by mouth every evening.     simvastatin (ZOCOR) 20 MG tablet Take 20 mg by mouth every evening.      tamsulosin (FLOMAX) 0.4 MG CAPS capsule Take 0.4 mg by mouth every evening.  No current facility-administered medications for this visit.   Allergies:  Patient has no known allergies.   ROS: No syncope.  Physical Exam: VS:  BP 118/80 (BP Location: Left Arm, Patient Position: Sitting, Cuff Size: Large)   Pulse 60   Ht 6' (1.829 m)   Wt 274 lb 6.4 oz (124.5 kg)   SpO2 97%   BMI 37.22 kg/m , BMI Body mass index is 37.22 kg/m.  Wt Readings from Last 3 Encounters:  07/10/21 274 lb 6.4 oz (124.5 kg)  01/03/21 274 lb 3.2 oz (124.4 kg)  08/11/20 275 lb 3.2 oz (124.8 kg)    General: Patient appears comfortable at rest. HEENT: Conjunctiva and lids normal, wearing a mask. Neck: Supple, no elevated JVP or carotid bruits. Lungs: Clear to auscultation, nonlabored breathing at rest. Cardiac: Regular rate and rhythm,  no S3, 2/6 systolic murmur.  ECG:  An ECG dated 02/07/2021 was personally reviewed today and demonstrated:  Ventricular paced rhythm with atrial tracking and prolonged PR interval.  Recent Labwork:  No interval lab work for review today.  Other Studies Reviewed Today:  Echocardiogram 03/01/2020:  1. Left ventricular ejection fraction, by estimation, is 55 to 60%. The  left ventricle has normal function. Left ventricular endocardial border  not optimally defined to evaluate regional wall motion. There is mild left  ventricular hypertrophy. Left  ventricular diastolic parameters were normal.   2. Right ventricular systolic function is normal. The right ventricular  size is normal. There is normal pulmonary artery systolic pressure. The  estimated right ventricular systolic pressure is 63.3 mmHg.   3. The mitral valve is grossly normal, mildly thickened and with moderate  annular calcification. Trivial mitral valve regurgitation.   4. The aortic valve is tricuspid, moderately calcified and with reduced  left coronary cusp excursion. Aortic valve regurgitation is not  visualized. Mild aortic valve stenosis. Mean gradient 9.5 mmHg and valve  area 1.92 cm2.   5. The inferior vena cava is normal in size with greater than 50%  respiratory variability, suggesting right atrial pressure of 3 mmHg.   Assessment and Plan:  1.  Paroxysmal atrial tachycardia.  Quiescent on Toprol-XL, no active sense of palpitations.  2.  Symptomatic second-degree type II heart block status post Medtronic pacemaker with regular follow-up per Dr. Rayann Heman.  3.  Asymptomatic mild calcific aortic stenosis.  Echocardiogram in June 2021 revealed mean gradient 9.5 mmHg.  No change in examination.  Continue observation for now.  Medication Adjustments/Labs and Tests Ordered: Current medicines are reviewed at length with the patient today.  Concerns regarding medicines are outlined above.   Tests Ordered: No orders of the  defined types were placed in this encounter.   Medication Changes: No orders of the defined types were placed in this encounter.   Disposition:  Follow up  6 months.  Signed, Satira Sark, MD, [facility] 07/10/2021 9:57 AM    [place] [facility] [place], [place], [place] [address] Phone: [phone]; Fax: [phone]

## 2021-07-10 ENCOUNTER — Encounter: Payer: Self-pay | Admitting: Cardiology

## 2021-07-10 ENCOUNTER — Ambulatory Visit: Payer: Medicare PPO | Admitting: Cardiology

## 2021-07-10 VITALS — BP 118/80 | HR 60 | Ht 72.0 in | Wt 274.4 lb

## 2021-07-10 DIAGNOSIS — Z950 Presence of cardiac pacemaker: Secondary | ICD-10-CM | POA: Diagnosis not present

## 2021-07-10 DIAGNOSIS — I350 Nonrheumatic aortic (valve) stenosis: Secondary | ICD-10-CM | POA: Diagnosis not present

## 2021-07-10 DIAGNOSIS — I471 Supraventricular tachycardia: Secondary | ICD-10-CM

## 2021-07-10 NOTE — Patient Instructions (Signed)

## 2021-07-24 ENCOUNTER — Ambulatory Visit (INDEPENDENT_AMBULATORY_CARE_PROVIDER_SITE_OTHER): Payer: Medicare PPO

## 2021-07-24 DIAGNOSIS — I441 Atrioventricular block, second degree: Secondary | ICD-10-CM | POA: Diagnosis not present

## 2021-07-24 LAB — CUP PACEART REMOTE DEVICE CHECK
Battery Remaining Longevity: 17 mo
Battery Voltage: 2.87 V
Brady Statistic AP VP Percent: 42.99 %
Brady Statistic AP VS Percent: 0 %
Brady Statistic AS VP Percent: 56.99 %
Brady Statistic AS VS Percent: 0.02 %
Brady Statistic RA Percent Paced: 42.98 %
Brady Statistic RV Percent Paced: 99.98 %
Date Time Interrogation Session: 20221122021602
Implantable Lead Implant Date: 20180824
Implantable Lead Implant Date: 20180824
Implantable Lead Location: 753859
Implantable Lead Location: 753860
Implantable Lead Model: 5076
Implantable Lead Model: 5076
Implantable Pulse Generator Implant Date: 20180824
Lead Channel Impedance Value: 323 Ohm
Lead Channel Impedance Value: 323 Ohm
Lead Channel Impedance Value: 342 Ohm
Lead Channel Impedance Value: 380 Ohm
Lead Channel Pacing Threshold Amplitude: 0.375 V
Lead Channel Pacing Threshold Amplitude: 2.25 V
Lead Channel Pacing Threshold Pulse Width: 0.4 ms
Lead Channel Pacing Threshold Pulse Width: 0.4 ms
Lead Channel Sensing Intrinsic Amplitude: 3.625 mV
Lead Channel Sensing Intrinsic Amplitude: 3.625 mV
Lead Channel Sensing Intrinsic Amplitude: 8.5 mV
Lead Channel Sensing Intrinsic Amplitude: 8.5 mV
Lead Channel Setting Pacing Amplitude: 1.5 V
Lead Channel Setting Pacing Amplitude: 3 V
Lead Channel Setting Pacing Pulse Width: 1 ms
Lead Channel Setting Sensing Sensitivity: 0.9 mV

## 2021-07-30 ENCOUNTER — Telehealth: Payer: Self-pay

## 2021-07-30 NOTE — Telephone Encounter (Signed)
-----   Message from Thompson Grayer, MD sent at 07/29/2021  5:35 PM EST ----- Remote device check reviewed.   Device report notable for:  he is dependant.  May benefit from additional safety margin given RV threshold.  Please bring in and check in the office.

## 2021-07-30 NOTE — Telephone Encounter (Signed)
Spoke with patient informed him of Dr. Bonita Quin recommendations patient agreeable to appointment with device clinic in [place] ( patient lives in [place]) on 08/10/21 at 12:30 patient ok to keep appointment with [facility] at [place] location on 08/17/21

## 2021-08-02 NOTE — Progress Notes (Signed)
Remote pacemaker transmission.   

## 2021-08-10 ENCOUNTER — Encounter: Payer: Self-pay | Admitting: Internal Medicine

## 2021-08-10 ENCOUNTER — Ambulatory Visit: Payer: Medicare PPO | Admitting: Internal Medicine

## 2021-08-10 VITALS — BP 128/74 | HR 60 | Ht 76.0 in | Wt 272.0 lb

## 2021-08-10 DIAGNOSIS — I442 Atrioventricular block, complete: Secondary | ICD-10-CM | POA: Diagnosis not present

## 2021-08-10 DIAGNOSIS — I10 Essential (primary) hypertension: Secondary | ICD-10-CM | POA: Diagnosis not present

## 2021-08-10 LAB — CUP PACEART INCLINIC DEVICE CHECK
Battery Remaining Longevity: 17 mo
Battery Voltage: 2.87 V
Brady Statistic AP VP Percent: 42.64 %
Brady Statistic AP VS Percent: 0 %
Brady Statistic AS VP Percent: 57.34 %
Brady Statistic AS VS Percent: 0.02 %
Brady Statistic RA Percent Paced: 42.63 %
Brady Statistic RV Percent Paced: 99.98 %
Date Time Interrogation Session: 20221209094406
Implantable Lead Implant Date: 20180824
Implantable Lead Implant Date: 20180824
Implantable Lead Location: 753859
Implantable Lead Location: 753860
Implantable Lead Model: 5076
Implantable Lead Model: 5076
Implantable Pulse Generator Implant Date: 20180824
Lead Channel Impedance Value: 304 Ohm
Lead Channel Impedance Value: 304 Ohm
Lead Channel Impedance Value: 342 Ohm
Lead Channel Impedance Value: 361 Ohm
Lead Channel Pacing Threshold Amplitude: 0.375 V
Lead Channel Pacing Threshold Amplitude: 2.125 V
Lead Channel Pacing Threshold Pulse Width: 0.4 ms
Lead Channel Pacing Threshold Pulse Width: 0.4 ms
Lead Channel Sensing Intrinsic Amplitude: 3 mV
Lead Channel Sensing Intrinsic Amplitude: 4.125 mV
Lead Channel Sensing Intrinsic Amplitude: 8.5 mV
Lead Channel Sensing Intrinsic Amplitude: 8.5 mV
Lead Channel Setting Pacing Amplitude: 1.5 V
Lead Channel Setting Pacing Amplitude: 3 V
Lead Channel Setting Pacing Pulse Width: 1 ms
Lead Channel Setting Sensing Sensitivity: 0.9 mV

## 2021-08-10 NOTE — Patient Instructions (Signed)
Medication Instructions:  Continue all current medications.   Labwork: none  Testing/Procedures: none  Follow-Up: 6 months   Any Other Special Instructions Will Be Listed Below (If Applicable).   If you need a refill on your cardiac medications before your next appointment, please call your pharmacy.  

## 2021-08-10 NOTE — Progress Notes (Signed)
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  Since last being seen in our clinic, the patient reports doing very well.  Today, he denies symptoms of palpitations, chest pain, shortness of breath,  lower extremity edema, dizziness, presyncope, or syncope.  The patient is otherwise without complaint today.   Past Medical History:  Diagnosis Date   Arthritis    Benign prostatic hyperplasia    Essential hypertension    Hemorrhoid    Hypercholesterolemia    RBBB    Second degree heart block    Medtronic pacemaker -Dr. Rayann Heman   SVT [facility]supraventricular tachycardia) [facility])    Atrial tachycardia   Past Surgical History:  Procedure Laterality Date   COLONOSCOPY N/A 03/24/2013   Procedure: COLONOSCOPY;  Surgeon: Rogene Houston, MD;  [facility];  Service: Endoscopy;  Laterality: N/A;  830   COLONOSCOPY N/A 08/05/2018   Procedure: COLONOSCOPY;  Surgeon: Rogene Houston, MD;  [facility];  Service: Endoscopy;  Laterality: N/A;  12   LAMINECTOMY     Lumbar   PACEMAKER IMPLANT N/A 04/25/2017   MDT [place]se Mead XT DR MRI implanted by Dr Rayann Heman for second degree AV block   POLYPECTOMY  08/05/2018   Procedure: POLYPECTOMY;  Surgeon: Rogene Houston, MD;  [facility];  Service: Endoscopy;;  ascending polyp, splenic flexure polyp, sigmoid polyp   PROSTATE BIOPSY     THYROID SURGERY  1999   L lobectomy, complicated by esophageal laceration requiring esophageal repair     ROS- all systems are reviewed and negative except as per HPI above  Current Outpatient Medications  Medication Sig Dispense Refill   amLODipine (NORVASC) 10 MG tablet TAKE 1 TABLET BY MOUTH EVERY DAY 180 tablet 3   aspirin 81 MG tablet Take 1 tablet (81 mg total) by mouth every evening. 30 tablet    Carboxymethylcellul-Glycerin (LUBRICATING EYE DROPS OP) Place 1 drop  into both eyes daily as needed (dry eyes).     Cholecalciferol (VITAMIN D3) 25 MCG (1000 UT) CAPS Take 1,000 Units by mouth daily.     finasteride (PROSCAR) 5 MG tablet Take 5 mg by mouth every evening.      metoprolol succinate (TOPROL-XL) 25 MG 24 hr tablet TAKE 1 TABLET(25 MG) BY MOUTH DAILY 90 tablet 2   Multiple Vitamin (MULTIVITAMIN) tablet Take 1 tablet by mouth every evening.      multivitamin-lutein (OCUVITE-LUTEIN) CAPS capsule Take 1 capsule by mouth every evening.      naproxen sodium (ALEVE) 220 MG tablet Take 440 mg by mouth daily as needed (pain).      Omega-3 Fatty Acids (FISH OIL) 1200 MG CAPS Take 1,200 mg by mouth every evening.     simvastatin (ZOCOR) 20 MG tablet Take 20 mg by mouth every evening.      tamsulosin (FLOMAX) 0.4 MG CAPS capsule Take 0.4 mg by mouth every evening.      No current facility-administered medications for this visit.    Physical Exam: Vitals:   08/10/21 0909  BP: 128/74  Pulse: 60  SpO2: 99%  Weight: 272 lb (123.4 kg)  Height: 6\' 4"  (1.93 m)    GEN- The patient is well appearing, alert and oriented x 3 today.   Head- normocephalic, atraumatic Eyes-  Sclera clear, conjunctiva pink Ears- hearing intact Oropharynx- clear Lungs- Clear to ausculation  bilaterally, normal work of breathing Chest- pacemaker pocket is well healed Heart- Regular rate and rhythm, no murmurs, rubs or gallops, PMI not laterally displaced GI- soft, NT, ND, + BS Extremities- no clubbing, cyanosis, or edema  Pacemaker interrogation- reviewed in detail today,  See PACEART report    Assessment and Plan:  1. Symptomatic complete heart block Normal pacemaker function See Pace Art report No changes today RV threshold is chronically elevated but stable.  Again today, its 1.5V@1  msec. He is programmed 3V@1msec  he is device dependant today We will plan lead revision at time of generator change unless his lead becomes unstable in the interim.  2. HTN Stable No  change required today   Risks, benefits and potential toxicities for medications prescribed and/or refilled reviewed with patient today.   Return in 6 months  Thompson Grayer MD, [facility] 08/10/2021 9:36 AM

## 2021-08-13 DIAGNOSIS — E7849 Other hyperlipidemia: Secondary | ICD-10-CM | POA: Diagnosis not present

## 2021-08-13 DIAGNOSIS — R7303 Prediabetes: Secondary | ICD-10-CM | POA: Diagnosis not present

## 2021-08-13 DIAGNOSIS — E7801 Familial hypercholesterolemia: Secondary | ICD-10-CM | POA: Diagnosis not present

## 2021-08-13 DIAGNOSIS — I10 Essential (primary) hypertension: Secondary | ICD-10-CM | POA: Diagnosis not present

## 2021-08-13 DIAGNOSIS — E782 Mixed hyperlipidemia: Secondary | ICD-10-CM | POA: Diagnosis not present

## 2021-08-13 DIAGNOSIS — E7800 Pure hypercholesterolemia, unspecified: Secondary | ICD-10-CM | POA: Diagnosis not present

## 2021-08-16 DIAGNOSIS — R7303 Prediabetes: Secondary | ICD-10-CM | POA: Diagnosis not present

## 2021-08-16 DIAGNOSIS — I10 Essential (primary) hypertension: Secondary | ICD-10-CM | POA: Diagnosis not present

## 2021-08-16 DIAGNOSIS — Z0001 Encounter for general adult medical examination with abnormal findings: Secondary | ICD-10-CM | POA: Diagnosis not present

## 2021-08-16 DIAGNOSIS — N401 Enlarged prostate with lower urinary tract symptoms: Secondary | ICD-10-CM | POA: Diagnosis not present

## 2021-08-16 DIAGNOSIS — E7849 Other hyperlipidemia: Secondary | ICD-10-CM | POA: Diagnosis not present

## 2021-08-16 DIAGNOSIS — Z1389 Encounter for screening for other disorder: Secondary | ICD-10-CM | POA: Diagnosis not present

## 2021-08-16 DIAGNOSIS — Z1331 Encounter for screening for depression: Secondary | ICD-10-CM | POA: Diagnosis not present

## 2021-08-16 DIAGNOSIS — R7301 Impaired fasting glucose: Secondary | ICD-10-CM | POA: Diagnosis not present

## 2021-08-17 ENCOUNTER — Encounter (HOSPITAL_BASED_OUTPATIENT_CLINIC_OR_DEPARTMENT_OTHER): Payer: Medicare PPO | Admitting: Internal Medicine

## 2021-10-22 DIAGNOSIS — R042 Hemoptysis: Secondary | ICD-10-CM | POA: Diagnosis not present

## 2021-10-22 DIAGNOSIS — Z6832 Body mass index (BMI) 32.0-32.9, adult: Secondary | ICD-10-CM | POA: Diagnosis not present

## 2021-10-22 DIAGNOSIS — J40 Bronchitis, not specified as acute or chronic: Secondary | ICD-10-CM | POA: Diagnosis not present

## 2021-10-22 DIAGNOSIS — R059 Cough, unspecified: Secondary | ICD-10-CM | POA: Diagnosis not present

## 2021-10-23 ENCOUNTER — Ambulatory Visit (INDEPENDENT_AMBULATORY_CARE_PROVIDER_SITE_OTHER): Payer: Medicare PPO

## 2021-10-23 DIAGNOSIS — I441 Atrioventricular block, second degree: Secondary | ICD-10-CM | POA: Diagnosis not present

## 2021-10-25 DIAGNOSIS — N529 Male erectile dysfunction, unspecified: Secondary | ICD-10-CM | POA: Diagnosis not present

## 2021-10-25 DIAGNOSIS — N401 Enlarged prostate with lower urinary tract symptoms: Secondary | ICD-10-CM | POA: Diagnosis not present

## 2021-10-25 DIAGNOSIS — R972 Elevated prostate specific antigen [PSA]: Secondary | ICD-10-CM | POA: Diagnosis not present

## 2021-10-25 DIAGNOSIS — N138 Other obstructive and reflux uropathy: Secondary | ICD-10-CM | POA: Diagnosis not present

## 2021-10-25 LAB — CUP PACEART REMOTE DEVICE CHECK
Battery Remaining Longevity: 14 mo
Battery Voltage: 2.85 V
Brady Statistic AP VP Percent: 34.56 %
Brady Statistic AP VS Percent: 0 %
Brady Statistic AS VP Percent: 65.41 %
Brady Statistic AS VS Percent: 0.03 %
Brady Statistic RA Percent Paced: 34.56 %
Brady Statistic RV Percent Paced: 99.97 %
Date Time Interrogation Session: 20230222215209
Implantable Lead Implant Date: 20180824
Implantable Lead Implant Date: 20180824
Implantable Lead Location: 753859
Implantable Lead Location: 753860
Implantable Lead Model: 5076
Implantable Lead Model: 5076
Implantable Pulse Generator Implant Date: 20180824
Lead Channel Impedance Value: 304 Ohm
Lead Channel Impedance Value: 323 Ohm
Lead Channel Impedance Value: 342 Ohm
Lead Channel Impedance Value: 380 Ohm
Lead Channel Pacing Threshold Amplitude: 0.375 V
Lead Channel Pacing Threshold Amplitude: 2 V
Lead Channel Pacing Threshold Pulse Width: 0.4 ms
Lead Channel Pacing Threshold Pulse Width: 0.4 ms
Lead Channel Sensing Intrinsic Amplitude: 3.625 mV
Lead Channel Sensing Intrinsic Amplitude: 3.625 mV
Lead Channel Sensing Intrinsic Amplitude: 8.625 mV
Lead Channel Sensing Intrinsic Amplitude: 8.625 mV
Lead Channel Setting Pacing Amplitude: 1.5 V
Lead Channel Setting Pacing Amplitude: 3 V
Lead Channel Setting Pacing Pulse Width: 1 ms
Lead Channel Setting Sensing Sensitivity: 0.9 mV

## 2021-10-30 NOTE — Progress Notes (Signed)
Remote pacemaker transmission.   

## 2021-11-23 DIAGNOSIS — R972 Elevated prostate specific antigen [PSA]: Secondary | ICD-10-CM | POA: Diagnosis not present

## 2021-12-26 DIAGNOSIS — H2513 Age-related nuclear cataract, bilateral: Secondary | ICD-10-CM | POA: Diagnosis not present

## 2021-12-26 DIAGNOSIS — H353132 Nonexudative age-related macular degeneration, bilateral, intermediate dry stage: Secondary | ICD-10-CM | POA: Diagnosis not present

## 2021-12-26 DIAGNOSIS — H43813 Vitreous degeneration, bilateral: Secondary | ICD-10-CM | POA: Diagnosis not present

## 2021-12-26 DIAGNOSIS — H5203 Hypermetropia, bilateral: Secondary | ICD-10-CM | POA: Diagnosis not present

## 2022-01-15 NOTE — Progress Notes (Signed)
? ? ?Cardiology Office Note ? ?Date: 01/16/2022  ? ?ID: Jonathan Lamb, DOB 04/20/1946, MRN [id number] ? ?PCP:  Manon Hilding, MD  ?Cardiologist:  Rozann Lesches, MD ?Electrophysiologist:  None  ? ?Chief Complaint  ?Patient presents with  ? Cardiac follow-up  ? ? ?History of Present Illness: ?Jonathan Lamb is a 76 y.o. male last seen in November 2022.  He is here for a follow-up visit.  Reports no major change in status.  Did experience some shortness of breath and fatigue walking up a steep hill while traveling in [place] earlier this year, otherwise walks a mile 5 days a week on level ground with his dogs and has no issues.  He does not report any angina, no palpitations. ? ?Medtronic pacemaker in place with followed by Dr. Rayann Heman.  He has a history of second-degree type II heart block and atrial tachycardia.  Interval visit noted with Dr. Rayann Heman in December 2022, I reviewed the note.  Last device interrogation indicated normal function.  Estimated battery life 14 months. ? ?I personally reviewed his ECG today which shows a ventricular paced rhythm.  I went over his medications which are stable from a cardiac perspective.  We discussed planning on an echocardiogram in about a year for follow-up of his aortic stenosis. ? ?Past Medical History:  ?Diagnosis Date  ? Arthritis   ? Benign prostatic hyperplasia   ? Essential hypertension   ? Hemorrhoid   ? Hypercholesterolemia   ? RBBB   ? Second degree heart block   ? Medtronic pacemaker -Dr. Rayann Heman  ? SVT (supraventricular tachycardia) ([place])   ? Atrial tachycardia  ? ? ?Past Surgical History:  ?Procedure Laterality Date  ? COLONOSCOPY N/A 03/24/2013  ? Procedure: COLONOSCOPY;  Surgeon: Rogene Houston, MD;  [facility];  Service: Endoscopy;  Laterality: N/A;  830  ? COLONOSCOPY N/A 08/05/2018  ? Procedure: COLONOSCOPY;  Surgeon: Rogene Houston, MD;  [facility];  Service: Endoscopy;  Laterality: N/A;  12  ? LAMINECTOMY    ? Lumbar  ?  PACEMAKER IMPLANT N/A 04/25/2017  ? MDT Azure XT DR MRI implanted by Dr Rayann Heman for second degree AV block  ? POLYPECTOMY  08/05/2018  ? Procedure: POLYPECTOMY;  Surgeon: Rogene Houston, MD;  [facility];  Service: Endoscopy;;  ascending polyp, splenic flexure polyp, sigmoid polyp  ? PROSTATE BIOPSY    ? THYROID SURGERY  1999  ? L lobectomy, complicated by esophageal laceration requiring esophageal repair   ? ? ?Current Outpatient Medications  ?Medication Sig Dispense Refill  ? amLODipine (NORVASC) 10 MG tablet TAKE 1 TABLET BY MOUTH EVERY DAY 180 tablet 3  ? aspirin 81 MG tablet Take 1 tablet (81 mg total) by mouth every evening. 30 tablet   ? Carboxymethylcellul-Glycerin (LUBRICATING EYE DROPS OP) Place 1 drop into both eyes daily as needed (dry eyes).    ? Cholecalciferol (VITAMIN D3) 25 MCG (1000 UT) CAPS Take 1,000 Units by mouth daily.    ? finasteride (PROSCAR) 5 MG tablet Take 5 mg by mouth every evening.     ? metoprolol succinate (TOPROL-XL) 25 MG 24 hr tablet TAKE 1 TABLET(25 MG) BY MOUTH DAILY 90 tablet 2  ? Multiple Vitamin (MULTIVITAMIN) tablet Take 1 tablet by mouth every evening.     ? multivitamin-lutein (OCUVITE-LUTEIN) CAPS capsule Take 1 capsule by mouth every evening.     ? naproxen sodium (ALEVE) 220 MG tablet Take 440 mg by  mouth daily as needed (pain).     ? Omega-3 Fatty Acids (FISH OIL) 1200 MG CAPS Take 1,200 mg by mouth every evening.    ? simvastatin (ZOCOR) 20 MG tablet Take 20 mg by mouth every evening.     ? tamsulosin (FLOMAX) 0.4 MG CAPS capsule Take 0.4 mg by mouth every evening.     ? ?No current facility-administered medications for this visit.  ? ?Allergies:  Patient has no known allergies.  ? ?ROS: No syncope. ? ?Physical Exam: ?VS:  BP 128/72   Pulse 74   Ht '6\' 4"'$  (1.93 m)   Wt 269 lb (122 kg)   SpO2 96%   BMI 32.74 kg/m? , BMI Body mass index is 32.74 kg/m?. ? ?Wt Readings from Last 3 Encounters:  ?01/16/22 269 lb (122 kg)  ?08/10/21 272 lb (123.4 kg)   ?07/10/21 274 lb 6.4 oz (124.5 kg)  ?  ?General: Patient appears comfortable at rest. ?HEENT: Conjunctiva and lids normal. ?Neck: Supple, no elevated JVP or carotid bruits, no thyromegaly. ?Lungs: Clear to auscultation, nonlabored breathing at rest. ?Cardiac: Regular rate and rhythm, no S3, 2/6 systolic murmur, no pericardial rub. ? ?ECG:  An ECG dated 02/07/2021 was personally reviewed today and demonstrated:  Ventricular paced rhythm with atrial tracking and prolonged PR interval. ? ?Recent Labwork: ? ?No interval lab work for review today. ? ?Other Studies Reviewed Today: ? ?Echocardiogram 03/01/2020: ? 1. Left ventricular ejection fraction, by estimation, is 55 to 60%. The  ?left ventricle has normal function. Left ventricular endocardial border  ?not optimally defined to evaluate regional wall motion. There is mild left  ?ventricular hypertrophy. Left  ?ventricular diastolic parameters were normal.  ? 2. Right ventricular systolic function is normal. The right ventricular  ?size is normal. There is normal pulmonary artery systolic pressure. The  ?estimated right ventricular systolic pressure is 53.7 mmHg.  ? 3. The mitral valve is grossly normal, mildly thickened and with moderate  ?annular calcification. Trivial mitral valve regurgitation.  ? 4. The aortic valve is tricuspid, moderately calcified and with reduced  ?left coronary cusp excursion. Aortic valve regurgitation is not  ?visualized. Mild aortic valve stenosis. Mean gradient 9.5 mmHg and valve  ?area 1.92 cm2.  ? 5. The inferior vena cava is normal in size with greater than 50%  ?respiratory variability, suggesting right atrial pressure of 3 mmHg.  ? ?Assessment and Plan: ? ?1.  Asymptomatic aortic stenosis, mild by assessment in June 2021 at which point mean gradient was approximately 10 mmHg.  No significant change in heart murmur.  We will plan on echocardiogram in about 1 year. ? ?2.  Symptomatic second-degree type II heart block status post  Medtronic pacemaker.  He continues to follow with Dr. Rayann Heman.  Recent device interrogation noted. ? ?3.  Paroxysmal atrial tachycardia.  Asymptomatic in terms of palpitations and continues with Toprol-XL. ? ?Medication Adjustments/Labs and Tests Ordered: ?Current medicines are reviewed at length with the patient today.  Concerns regarding medicines are outlined above.  ? ?Tests Ordered: ?Orders Placed This Encounter  ?Procedures  ? EKG 12-Lead  ? ? ?Medication Changes: ?No orders of the defined types were placed in this encounter. ? ? ?Disposition:  Follow up  6 months. ? ?Signed, ?Satira Sark, MD, [facility] ?01/16/2022 10:03 AM    ?[place] [facility] ?[place], [place], [place] [address] ?Phone: [phone]; Fax: [phone]  ?

## 2022-01-16 ENCOUNTER — Encounter: Payer: Self-pay | Admitting: Cardiology

## 2022-01-16 ENCOUNTER — Ambulatory Visit: Payer: Medicare PPO | Admitting: Cardiology

## 2022-01-16 VITALS — BP 128/72 | HR 74 | Ht 76.0 in | Wt 269.0 lb

## 2022-01-16 DIAGNOSIS — I471 Supraventricular tachycardia: Secondary | ICD-10-CM | POA: Diagnosis not present

## 2022-01-16 DIAGNOSIS — I350 Nonrheumatic aortic (valve) stenosis: Secondary | ICD-10-CM | POA: Diagnosis not present

## 2022-01-16 DIAGNOSIS — I441 Atrioventricular block, second degree: Secondary | ICD-10-CM | POA: Diagnosis not present

## 2022-01-16 NOTE — Patient Instructions (Addendum)

## 2022-01-22 ENCOUNTER — Ambulatory Visit (INDEPENDENT_AMBULATORY_CARE_PROVIDER_SITE_OTHER): Payer: Medicare PPO

## 2022-01-22 DIAGNOSIS — I441 Atrioventricular block, second degree: Secondary | ICD-10-CM | POA: Diagnosis not present

## 2022-01-24 LAB — CUP PACEART REMOTE DEVICE CHECK
Battery Remaining Longevity: 9 mo
Battery Voltage: 2.81 V
Brady Statistic AP VP Percent: 35.44 %
Brady Statistic AP VS Percent: 0 %
Brady Statistic AS VP Percent: 64.53 %
Brady Statistic AS VS Percent: 0.03 %
Brady Statistic RA Percent Paced: 35.44 %
Brady Statistic RV Percent Paced: 99.97 %
Date Time Interrogation Session: 20230524033455
Implantable Lead Implant Date: 20180824
Implantable Lead Implant Date: 20180824
Implantable Lead Location: 753859
Implantable Lead Location: 753860
Implantable Lead Model: 5076
Implantable Lead Model: 5076
Implantable Pulse Generator Implant Date: 20180824
Lead Channel Impedance Value: 285 Ohm
Lead Channel Impedance Value: 285 Ohm
Lead Channel Impedance Value: 323 Ohm
Lead Channel Impedance Value: 342 Ohm
Lead Channel Pacing Threshold Amplitude: 0.375 V
Lead Channel Pacing Threshold Amplitude: 1.75 V
Lead Channel Pacing Threshold Pulse Width: 0.4 ms
Lead Channel Pacing Threshold Pulse Width: 0.4 ms
Lead Channel Sensing Intrinsic Amplitude: 1.625 mV
Lead Channel Sensing Intrinsic Amplitude: 1.625 mV
Lead Channel Sensing Intrinsic Amplitude: 14.625 mV
Lead Channel Sensing Intrinsic Amplitude: 14.625 mV
Lead Channel Setting Pacing Amplitude: 1.5 V
Lead Channel Setting Pacing Amplitude: 3 V
Lead Channel Setting Pacing Pulse Width: 1 ms
Lead Channel Setting Sensing Sensitivity: 0.9 mV

## 2022-01-25 ENCOUNTER — Encounter: Payer: Self-pay | Admitting: Internal Medicine

## 2022-02-01 ENCOUNTER — Encounter: Payer: Medicare PPO | Admitting: Internal Medicine

## 2022-02-06 NOTE — Progress Notes (Signed)
Remote pacemaker transmission.   

## 2022-02-08 ENCOUNTER — Ambulatory Visit (INDEPENDENT_AMBULATORY_CARE_PROVIDER_SITE_OTHER): Payer: Medicare PPO | Admitting: Internal Medicine

## 2022-02-08 ENCOUNTER — Other Ambulatory Visit (INDEPENDENT_AMBULATORY_CARE_PROVIDER_SITE_OTHER): Payer: Medicare PPO

## 2022-02-08 VITALS — BP 137/84 | HR 60 | Ht 76.0 in | Wt 271.0 lb

## 2022-02-08 DIAGNOSIS — I10 Essential (primary) hypertension: Secondary | ICD-10-CM | POA: Diagnosis not present

## 2022-02-08 DIAGNOSIS — I442 Atrioventricular block, complete: Secondary | ICD-10-CM

## 2022-02-08 NOTE — Patient Instructions (Signed)
Medication Instructions:  Your physician recommends that you continue on your current medications as directed. Please refer to the Current Medication list given to you today.  *If you need a refill on your cardiac medications before your next appointment, please call your pharmacy*   Lab Work: None ordered.  If you have labs (blood work) drawn today and your tests are completely normal, you will receive your results only by: [place] (if you have MyChart) OR A paper copy in the mail If you have any lab test that is abnormal or we need to change your treatment, we will call you to review the results.   Testing/Procedures: None ordered.    Follow-Up: At [facility], you and your health needs are our priority.  As part of our continuing mission to provide you with exceptional heart care, we have created designated Provider Care Teams.  These Care Teams include your primary Cardiologist (physician) and Advanced Practice Providers (APPs -  Physician Assistants and Nurse Practitioners) who all work together to provide you with the care you need, when you need it.  We recommend signing up for the patient portal called "MyChart".  Sign up information is provided on this After Visit Summary.  MyChart is used to connect with patients for Virtual Visits (Telemedicine).  Patients are able to view lab/test results, encounter notes, upcoming appointments, etc.  Non-urgent messages can be sent to your provider as well.   To learn more about what you can do with MyChart, go to [email or web address].    Your next appointment:   Dr Lovena Le in 6 months at the [facility] office  Important Information About Sugar

## 2022-02-08 NOTE — Progress Notes (Signed)
Ekg

## 2022-02-08 NOTE — Progress Notes (Signed)
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 male who presents today for routine electrophysiology followup.  Since last being seen in our clinic, the patient reports doing very well.  Today, he denies symptoms of palpitations, chest pain, shortness of breath,  lower extremity edema, dizziness, presyncope, or syncope.  The patient is otherwise without complaint today.   Past Medical History:  Diagnosis Date   Arthritis    Benign prostatic hyperplasia    Essential hypertension    Hemorrhoid    Hypercholesterolemia    RBBB    Second degree heart block    Medtronic pacemaker -Dr. Rayann Heman   SVT (supraventricular tachycardia) [facility])    Atrial tachycardia   Past Surgical History:  Procedure Laterality Date   COLONOSCOPY N/A 03/24/2013   Procedure: COLONOSCOPY;  Surgeon: Rogene Houston, MD;  [facility];  Service: Endoscopy;  Laterality: N/A;  830   COLONOSCOPY N/A 08/05/2018   Procedure: COLONOSCOPY;  Surgeon: Rogene Houston, MD;  [facility];  Service: Endoscopy;  Laterality: N/A;  12   LAMINECTOMY     Lumbar   PACEMAKER IMPLANT N/A 04/25/2017   MDT [place] XT DR MRI implanted by Dr Rayann Heman for second degree AV block   POLYPECTOMY  08/05/2018   Procedure: POLYPECTOMY;  Surgeon: Rogene Houston, MD;  [facility];  Service: Endoscopy;;  ascending polyp, splenic flexure polyp, sigmoid polyp   PROSTATE BIOPSY     THYROID SURGERY  1999   L lobectomy, complicated by esophageal laceration requiring esophageal repair     ROS- all systems are reviewed and negative except as per HPI above  Current Outpatient Medications  Medication Sig Dispense Refill   amLODipine (NORVASC) 10 MG tablet TAKE 1 TABLET BY MOUTH EVERY DAY 180 tablet 3   aspirin 81 MG tablet Take 1 tablet (81 mg total) by mouth every evening. 30 tablet    Carboxymethylcellul-Glycerin (LUBRICATING EYE DROPS OP) Place 1 drop  into both eyes daily as needed (dry eyes).     Cholecalciferol (VITAMIN D3) 25 MCG (1000 UT) CAPS Take 1,000 Units by mouth daily.     finasteride (PROSCAR) 5 MG tablet Take 5 mg by mouth every evening.      metoprolol succinate (TOPROL-XL) 25 MG 24 hr tablet TAKE 1 TABLET(25 MG) BY MOUTH DAILY 90 tablet 2   Multiple Vitamin (MULTIVITAMIN) tablet Take 1 tablet by mouth every evening.      multivitamin-lutein (OCUVITE-LUTEIN) CAPS capsule Take 1 capsule by mouth every evening.      naproxen sodium (ALEVE) 220 MG tablet Take 440 mg by mouth daily as needed (pain).      Omega-3 Fatty Acids (FISH OIL) 1200 MG CAPS Take 1,200 mg by mouth every evening.     simvastatin (ZOCOR) 20 MG tablet Take 20 mg by mouth every evening.      tamsulosin (FLOMAX) 0.4 MG CAPS capsule Take 0.4 mg by mouth every evening.      No current facility-administered medications for this visit.    Physical Exam: Vitals:   02/08/22 0923  BP: 137/84  Pulse: 60  SpO2: 97%  Weight: 271 lb (122.9 kg)  Height: '6\' 4"'$  (1.93 m)    GEN- The patient is well appearing, alert and oriented x 3 today.   Head- normocephalic, atraumatic Eyes-  Sclera clear, conjunctiva pink Ears- hearing intact Oropharynx- clear Lungs- Clear to ausculation  bilaterally, normal work of breathing Chest- pacemaker pocket is well healed Heart- Regular rate and rhythm, no murmurs, rubs or gallops, PMI not laterally displaced GI- soft, NT, ND, + BS Extremities- no clubbing, cyanosis, or edema  Pacemaker interrogation- reviewed in detail today,  See PACEART report  ekg tracing ordered today is personally reviewed and shows sinus with V pacing  Assessment and Plan:  1. Symptomatic complete heart block Normal pacemaker function See Claudia Desanctis Art report He was recently in [place].  During heavy exertion, he felt that he needed a higher heart rate.  I have therefore increased his max tracking rate from 120 to 130 bpm today. No other changes ordered  today. he is device dependant today RV threshold is chronically elevated but stable.   today, its 1.75V'@1'$  msec. He is programmed 3V'@1msec'$  We will plan lead revision at time of generator change unless his lead becomes unstable in the interim. We discussed this at length today.  He has 8 months estimated battery longevity.  I will therefore have him see Dr Lovena Le in [place] in 6 months to discuss lead revision at that time.  2. HTN Stable No change required today  Return in 6 months  Thompson Grayer MD, [facility] 02/08/2022 9:35 AM

## 2022-02-21 ENCOUNTER — Encounter: Payer: Self-pay | Admitting: Internal Medicine

## 2022-04-23 ENCOUNTER — Ambulatory Visit (INDEPENDENT_AMBULATORY_CARE_PROVIDER_SITE_OTHER): Payer: Medicare PPO

## 2022-04-23 DIAGNOSIS — I442 Atrioventricular block, complete: Secondary | ICD-10-CM

## 2022-04-23 LAB — CUP PACEART REMOTE DEVICE CHECK
Battery Remaining Longevity: 6 mo
Battery Voltage: 2.77 V
Brady Statistic AP VP Percent: 44.22 %
Brady Statistic AP VS Percent: 0 %
Brady Statistic AS VP Percent: 55.74 %
Brady Statistic AS VS Percent: 0.04 %
Brady Statistic RA Percent Paced: 44.23 %
Brady Statistic RV Percent Paced: 99.96 %
Date Time Interrogation Session: 20230822032802
Implantable Lead Implant Date: 20180824
Implantable Lead Implant Date: 20180824
Implantable Lead Location: 753859
Implantable Lead Location: 753860
Implantable Lead Model: 5076
Implantable Lead Model: 5076
Implantable Pulse Generator Implant Date: 20180824
Lead Channel Impedance Value: 304 Ohm
Lead Channel Impedance Value: 323 Ohm
Lead Channel Impedance Value: 323 Ohm
Lead Channel Impedance Value: 380 Ohm
Lead Channel Pacing Threshold Amplitude: 0.375 V
Lead Channel Pacing Threshold Amplitude: 1.875 V
Lead Channel Pacing Threshold Pulse Width: 0.4 ms
Lead Channel Pacing Threshold Pulse Width: 0.4 ms
Lead Channel Sensing Intrinsic Amplitude: 14.625 mV
Lead Channel Sensing Intrinsic Amplitude: 14.625 mV
Lead Channel Sensing Intrinsic Amplitude: 3.5 mV
Lead Channel Sensing Intrinsic Amplitude: 3.5 mV
Lead Channel Setting Pacing Amplitude: 1.5 V
Lead Channel Setting Pacing Amplitude: 3 V
Lead Channel Setting Pacing Pulse Width: 1 ms
Lead Channel Setting Sensing Sensitivity: 0.9 mV

## 2022-05-21 NOTE — Progress Notes (Signed)
Remote pacemaker transmission.   

## 2022-07-11 DIAGNOSIS — N401 Enlarged prostate with lower urinary tract symptoms: Secondary | ICD-10-CM | POA: Diagnosis not present

## 2022-07-11 DIAGNOSIS — R972 Elevated prostate specific antigen [PSA]: Secondary | ICD-10-CM | POA: Diagnosis not present

## 2022-07-11 DIAGNOSIS — N529 Male erectile dysfunction, unspecified: Secondary | ICD-10-CM | POA: Diagnosis not present

## 2022-07-11 DIAGNOSIS — N138 Other obstructive and reflux uropathy: Secondary | ICD-10-CM | POA: Diagnosis not present

## 2022-07-23 ENCOUNTER — Ambulatory Visit (INDEPENDENT_AMBULATORY_CARE_PROVIDER_SITE_OTHER): Payer: Medicare PPO

## 2022-07-23 DIAGNOSIS — I442 Atrioventricular block, complete: Secondary | ICD-10-CM | POA: Diagnosis not present

## 2022-07-23 LAB — CUP PACEART REMOTE DEVICE CHECK
Battery Remaining Longevity: 3 mo
Battery Voltage: 2.69 V
Brady Statistic AP VP Percent: 48.18 %
Brady Statistic AP VS Percent: 0 %
Brady Statistic AS VP Percent: 51.75 %
Brady Statistic AS VS Percent: 0.07 %
Brady Statistic RA Percent Paced: 48.2 %
Brady Statistic RV Percent Paced: 99.93 %
Date Time Interrogation Session: 20231121073351
Implantable Lead Connection Status: 753985
Implantable Lead Connection Status: 753985
Implantable Lead Implant Date: 20180824
Implantable Lead Implant Date: 20180824
Implantable Lead Location: 753859
Implantable Lead Location: 753860
Implantable Lead Model: 5076
Implantable Lead Model: 5076
Implantable Pulse Generator Implant Date: 20180824
Lead Channel Impedance Value: 304 Ohm
Lead Channel Impedance Value: 323 Ohm
Lead Channel Impedance Value: 342 Ohm
Lead Channel Impedance Value: 399 Ohm
Lead Channel Pacing Threshold Amplitude: 0.375 V
Lead Channel Pacing Threshold Amplitude: 1.625 V
Lead Channel Pacing Threshold Pulse Width: 0.4 ms
Lead Channel Pacing Threshold Pulse Width: 0.4 ms
Lead Channel Sensing Intrinsic Amplitude: 19.875 mV
Lead Channel Sensing Intrinsic Amplitude: 19.875 mV
Lead Channel Sensing Intrinsic Amplitude: 3.875 mV
Lead Channel Sensing Intrinsic Amplitude: 3.875 mV
Lead Channel Setting Pacing Amplitude: 1.5 V
Lead Channel Setting Pacing Amplitude: 3 V
Lead Channel Setting Pacing Pulse Width: 1 ms
Lead Channel Setting Sensing Sensitivity: 0.9 mV
Zone Setting Status: 755011
Zone Setting Status: 755011

## 2022-07-31 NOTE — Progress Notes (Unsigned)
Cardiology Office Note  Date: 08/01/2022   ID: Jonathan Lamb, DOB 01-17-1946, MRN [id number]  PCP:  Manon Hilding, MD  Cardiologist:  Rozann Lesches, MD Electrophysiologist:  Thompson Grayer, MD   Chief Complaint  Patient presents with   Cardiac follow-up    History of Present Illness: Jonathan Lamb is a 76 y.o. male last seen in May.  He is here with his wife for follow-up visit.  Reports no exertional chest pain or palpitations, no dizziness or sudden syncope.  Medtronic pacemaker in place with history of second-degree type II heart block and atrial tachycardia.  Recent device interrogation indicated normal function.  He has a visit scheduled to see Dr. Lovena Le in the next few weeks.  Last device check indicated estimated generator longevity of 3 months.  We discussed his last echocardiogram from 2021 at which point he had mild aortic stenosis with mean gradient approximately 10 mmHg.  We will plan a follow-up study in 6 months with clinical reevaluation.  I reviewed his medications.  He has not had recent lab work with PCP, plans to call about a follow-up visit.  His last LDL was in the 80s in December 2022.  Past Medical History:  Diagnosis Date   Arthritis    Benign prostatic hyperplasia    Essential hypertension    Hemorrhoid    Hypercholesterolemia    RBBB    Second degree heart block    Medtronic pacemaker -Dr. Rayann Heman   SVT (supraventricular tachycardia)    Atrial tachycardia    Past Surgical History:  Procedure Laterality Date   COLONOSCOPY N/A 03/24/2013   Procedure: COLONOSCOPY;  Surgeon: Rogene Houston, MD;  [facility];  Service: Endoscopy;  Laterality: N/A;  830   COLONOSCOPY N/A 08/05/2018   Procedure: COLONOSCOPY;  Surgeon: Rogene Houston, MD;  [facility];  Service: Endoscopy;  Laterality: N/A;  12   LAMINECTOMY     Lumbar   PACEMAKER IMPLANT N/A 04/25/2017   MDT [place]se Mead XT DR MRI implanted by Dr Rayann Heman for second degree AV  block   POLYPECTOMY  08/05/2018   Procedure: POLYPECTOMY;  Surgeon: Rogene Houston, MD;  [facility];  Service: Endoscopy;;  ascending polyp, splenic flexure polyp, sigmoid polyp   PROSTATE BIOPSY     THYROID SURGERY  1999   L lobectomy, complicated by esophageal laceration requiring esophageal repair     Current Outpatient Medications  Medication Sig Dispense Refill   amLODipine (NORVASC) 10 MG tablet TAKE 1 TABLET BY MOUTH EVERY DAY 180 tablet 3   aspirin 81 MG tablet Take 1 tablet (81 mg total) by mouth every evening. 30 tablet    Carboxymethylcellul-Glycerin (LUBRICATING EYE DROPS OP) Place 1 drop into both eyes daily as needed (dry eyes).     Cholecalciferol (VITAMIN D3) 25 MCG (1000 UT) CAPS Take 1,000 Units by mouth daily.     finasteride (PROSCAR) 5 MG tablet Take 5 mg by mouth every evening.      metoprolol succinate (TOPROL-XL) 25 MG 24 hr tablet TAKE 1 TABLET(25 MG) BY MOUTH DAILY 90 tablet 2   Multiple Vitamin (MULTIVITAMIN) tablet Take 1 tablet by mouth every evening.      multivitamin-lutein (OCUVITE-LUTEIN) CAPS capsule Take 1 capsule by mouth every evening.      naproxen sodium (ALEVE) 220 MG tablet Take 440 mg by mouth daily as needed (pain).      Omega-3 Fatty Acids (FISH OIL) 1200 MG CAPS Take  1,200 mg by mouth every evening.     simvastatin (ZOCOR) 20 MG tablet Take 20 mg by mouth every evening.      tamsulosin (FLOMAX) 0.4 MG CAPS capsule Take 0.4 mg by mouth every evening.      No current facility-administered medications for this visit.   Allergies:  Patient has no known allergies.   ROS: No orthopnea or PND.  No claudication.  Physical Exam: VS:  BP 136/80   Pulse 61   Ht '6\' 3"'$  (1.905 m)   Wt 277 lb (125.6 kg)   SpO2 96%   BMI 34.62 kg/m , BMI Body mass index is 34.62 kg/m.  Wt Readings from Last 3 Encounters:  08/01/22 277 lb (125.6 kg)  02/08/22 271 lb (122.9 kg)  01/16/22 269 lb (122 kg)    General: Patient appears comfortable at  rest. HEENT: Conjunctiva and lids normal. Neck: Supple, no elevated JVP or carotid bruits. Lungs: Clear to auscultation, nonlabored breathing at rest. Cardiac: Regular rate and rhythm, no S3, 3/6 systolic murmur right upper sternal border, no pericardial rub. Abdomen: Soft, nontender, bowel sounds present, abdominal bruit versus radiation of cardiac murmur. Extremities: No pitting edema.  ECG:  An ECG dated 02/08/2022 was personally reviewed today and demonstrated:  Dual chamber pacing.  Recent Labwork:  December 2022: BUN 18, creatinine 1.09, potassium 4.4, AST 24, ALT 22, cholesterol 148, triglycerides 108, HDL 41, LDL 87, hemoglobin A1c 6%  Other Studies Reviewed Today:  Echocardiogram 03/01/2020:  1. Left ventricular ejection fraction, by estimation, is 55 to 60%. The  left ventricle has normal function. Left ventricular endocardial border  not optimally defined to evaluate regional wall motion. There is mild left  ventricular hypertrophy. Left  ventricular diastolic parameters were normal.   2. Right ventricular systolic function is normal. The right ventricular  size is normal. There is normal pulmonary artery systolic pressure. The  estimated right ventricular systolic pressure is 70.6 mmHg.   3. The mitral valve is grossly normal, mildly thickened and with moderate  annular calcification. Trivial mitral valve regurgitation.   4. The aortic valve is tricuspid, moderately calcified and with reduced  left coronary cusp excursion. Aortic valve regurgitation is not  visualized. Mild aortic valve stenosis. Mean gradient 9.5 mmHg and valve  area 1.92 cm2.   5. The inferior vena cava is normal in size with greater than 50%  respiratory variability, suggesting right atrial pressure of 3 mmHg.   Assessment and Plan:  1.  Asymptomatic calcific aortic stenosis, mild by echocardiogram in June 2021.  Mean AV gradient approximately 10 mmHg at that time.  We will plan a follow-up  echocardiogram for surveillance in 6 months with clinical reevaluation.  2.  Plan to arrange abdominal aortic ultrasound for AAA screening, he has not had a prior assessment.  Has abdominal bruit versus radiation of cardiac murmur.  3.  Symptomatic second-degree type II heart block status post Medtronic pacemaker, he is establishing with Dr. Lovena Le in the next few weeks.  Last device check indicated estimated 76-monthgenerator longevity.  4.  Paroxysmal atrial tachycardia, doing well on Toprol-XL.  Medication Adjustments/Labs and Tests Ordered: Current medicines are reviewed at length with the patient today.  Concerns regarding medicines are outlined above.   Tests Ordered: Orders Placed This Encounter  Procedures   [place] SCRE[place]ING   ECHOCARDIOGRAM COMPLETE    Medication Changes: No orders of the defined types were placed in this encounter.   Disposition:  Follow up  6 months.  Signed, Satira Sark, MD, [facility] 08/01/2022 9:57 AM    [facility]. [address], [place], [place] [address] Phone: [phone]; Fax: [phone]

## 2022-08-01 ENCOUNTER — Ambulatory Visit: Payer: Medicare PPO | Attending: Cardiology | Admitting: Cardiology

## 2022-08-01 ENCOUNTER — Encounter: Payer: Self-pay | Admitting: Cardiology

## 2022-08-01 VITALS — BP 136/80 | HR 61 | Ht 75.0 in | Wt 277.0 lb

## 2022-08-01 DIAGNOSIS — I4719 Other supraventricular tachycardia: Secondary | ICD-10-CM

## 2022-08-01 DIAGNOSIS — I350 Nonrheumatic aortic (valve) stenosis: Secondary | ICD-10-CM | POA: Diagnosis not present

## 2022-08-01 DIAGNOSIS — Z136 Encounter for screening for cardiovascular disorders: Secondary | ICD-10-CM

## 2022-08-01 NOTE — Patient Instructions (Addendum)
Medication Instructions:  Your physician recommends that you continue on your current medications as directed. Please refer to the Current Medication list given to you today.   Labwork: None today  Testing/Procedures: US abdomen for AAA screen   ECHO in 6 months  Follow-Up: 6 months after Echo  Any Other Special Instructions Will Be Listed Below (If Applicable).  If you need a refill on your cardiac medications before your next appointment, please call your pharmacy.

## 2022-08-08 ENCOUNTER — Ambulatory Visit (HOSPITAL_COMMUNITY)
Admission: RE | Admit: 2022-08-08 | Discharge: 2022-08-08 | Disposition: A | Discharge status: Home / Self Care | Payer: Medicare PPO | Source: Ambulatory Visit | Attending: Cardiology | Admitting: Cardiology

## 2022-08-08 DIAGNOSIS — Z136 Encounter for screening for cardiovascular disorders: Secondary | ICD-10-CM | POA: Diagnosis not present

## 2022-08-13 ENCOUNTER — Ambulatory Visit: Payer: Medicare PPO | Attending: Internal Medicine | Admitting: Internal Medicine

## 2022-08-13 ENCOUNTER — Encounter: Payer: Self-pay | Admitting: Internal Medicine

## 2022-08-13 VITALS — BP 126/76 | HR 60 | Ht 76.0 in | Wt 271.0 lb

## 2022-08-13 DIAGNOSIS — R7303 Prediabetes: Secondary | ICD-10-CM | POA: Diagnosis not present

## 2022-08-13 DIAGNOSIS — R7301 Impaired fasting glucose: Secondary | ICD-10-CM | POA: Diagnosis not present

## 2022-08-13 DIAGNOSIS — I441 Atrioventricular block, second degree: Secondary | ICD-10-CM | POA: Diagnosis not present

## 2022-08-13 DIAGNOSIS — E7801 Familial hypercholesterolemia: Secondary | ICD-10-CM | POA: Diagnosis not present

## 2022-08-13 DIAGNOSIS — Z0001 Encounter for general adult medical examination with abnormal findings: Secondary | ICD-10-CM | POA: Diagnosis not present

## 2022-08-13 DIAGNOSIS — E782 Mixed hyperlipidemia: Secondary | ICD-10-CM | POA: Diagnosis not present

## 2022-08-13 DIAGNOSIS — I10 Essential (primary) hypertension: Secondary | ICD-10-CM | POA: Diagnosis not present

## 2022-08-13 DIAGNOSIS — E7800 Pure hypercholesterolemia, unspecified: Secondary | ICD-10-CM | POA: Diagnosis not present

## 2022-08-13 DIAGNOSIS — Z1329 Encounter for screening for other suspected endocrine disorder: Secondary | ICD-10-CM | POA: Diagnosis not present

## 2022-08-13 DIAGNOSIS — E7849 Other hyperlipidemia: Secondary | ICD-10-CM | POA: Diagnosis not present

## 2022-08-13 DIAGNOSIS — R5382 Chronic fatigue, unspecified: Secondary | ICD-10-CM | POA: Diagnosis not present

## 2022-08-13 LAB — CUP PACEART INCLINIC DEVICE CHECK
Battery Remaining Longevity: 2 mo
Battery Voltage: 2.67 V
Brady Statistic AP VP Percent: 46.45 %
Brady Statistic AP VS Percent: 0 %
Brady Statistic AS VP Percent: 53.49 %
Brady Statistic AS VS Percent: 0.06 %
Brady Statistic RA Percent Paced: 46.47 %
Brady Statistic RV Percent Paced: 99.94 %
Date Time Interrogation Session: 20231212104029
Implantable Lead Connection Status: 753985
Implantable Lead Connection Status: 753985
Implantable Lead Implant Date: 20180824
Implantable Lead Implant Date: 20180824
Implantable Lead Location: 753859
Implantable Lead Location: 753860
Implantable Lead Model: 5076
Implantable Lead Model: 5076
Implantable Pulse Generator Implant Date: 20180824
Lead Channel Impedance Value: 304 Ohm
Lead Channel Impedance Value: 323 Ohm
Lead Channel Impedance Value: 342 Ohm
Lead Channel Impedance Value: 399 Ohm
Lead Channel Pacing Threshold Amplitude: 0.5 V
Lead Channel Pacing Threshold Amplitude: 1.25 V
Lead Channel Pacing Threshold Amplitude: 1.75 V
Lead Channel Pacing Threshold Pulse Width: 0.4 ms
Lead Channel Pacing Threshold Pulse Width: 0.4 ms
Lead Channel Pacing Threshold Pulse Width: 0.6 ms
Lead Channel Sensing Intrinsic Amplitude: 0 mV
Lead Channel Sensing Intrinsic Amplitude: 3.375 mV
Lead Channel Sensing Intrinsic Amplitude: 3.625 mV
Lead Channel Sensing Intrinsic Amplitude: 9.875 mV
Lead Channel Sensing Intrinsic Amplitude: 9.875 mV
Lead Channel Setting Pacing Amplitude: 1.5 V
Lead Channel Setting Pacing Amplitude: 2.5 V
Lead Channel Setting Pacing Pulse Width: 0.8 ms
Lead Channel Setting Sensing Sensitivity: 0.9 mV
Zone Setting Status: 755011
Zone Setting Status: 755011

## 2022-08-13 NOTE — Progress Notes (Signed)
HPI Jonathan Lamb returns today for followup. He is a pleasant 76 yo man with CHB s/p PPM insertion. He had been followed by Dr. Greggory Lamb and returns today for ongoing arrhythmias followup. He also sees Dr. Domenic Lamb. He has HTN and a h/o AT. His pacing threshold has been up a little. He denies chest pain, sob, or worsening edema.  No Known Allergies   Current Outpatient Medications  Medication Sig Dispense Refill   amLODipine (NORVASC) 10 MG tablet TAKE 1 TABLET BY MOUTH EVERY DAY 180 tablet 3   aspirin 81 MG tablet Take 1 tablet (81 mg total) by mouth every evening. 30 tablet    Carboxymethylcellul-Glycerin (LUBRICATING EYE DROPS OP) Place 1 drop into both eyes daily as needed (dry eyes).     Cholecalciferol (VITAMIN D3) 25 MCG (1000 UT) CAPS Take 1,000 Units by mouth daily.     finasteride (PROSCAR) 5 MG tablet Take 5 mg by mouth every evening.      metoprolol succinate (TOPROL-XL) 25 MG 24 hr tablet TAKE 1 TABLET(25 MG) BY MOUTH DAILY 90 tablet 2   Multiple Vitamin (MULTIVITAMIN) tablet Take 1 tablet by mouth every evening.      multivitamin-lutein (OCUVITE-LUTEIN) CAPS capsule Take 1 capsule by mouth every evening.      naproxen sodium (ALEVE) 220 MG tablet Take 440 mg by mouth daily as needed (pain).      Omega-3 Fatty Acids (FISH OIL) 1200 MG CAPS Take 1,200 mg by mouth every evening.     simvastatin (ZOCOR) 20 MG tablet Take 20 mg by mouth every evening.      tamsulosin (FLOMAX) 0.4 MG CAPS capsule Take 0.4 mg by mouth every evening.      No current facility-administered medications for this visit.     Past Medical History:  Diagnosis Date   Arthritis    Benign prostatic hyperplasia    Essential hypertension    Hemorrhoid    Hypercholesterolemia    RBBB    Second degree heart block    Medtronic pacemaker -Dr. Rayann Lamb   SVT (supraventricular tachycardia)    Atrial tachycardia    ROS:   All systems reviewed and negative except as noted in the HPI.   Past Surgical  History:  Procedure Laterality Date   COLONOSCOPY N/A 03/24/2013   Procedure: COLONOSCOPY;  Surgeon: Jonathan Houston, MD;  [facility];  Service: Endoscopy;  Laterality: N/A;  830   COLONOSCOPY N/A 08/05/2018   Procedure: COLONOSCOPY;  Surgeon: Jonathan Houston, MD;  [facility];  Service: Endoscopy;  Laterality: N/A;  12   LAMINECTOMY     Lumbar   PACEMAKER IMPLANT N/A 04/25/2017   MDT [place] XT DR MRI implanted by Dr Jonathan Lamb for second degree AV block   POLYPECTOMY  08/05/2018   Procedure: POLYPECTOMY;  Surgeon: Jonathan Houston, MD;  [facility];  Service: Endoscopy;;  ascending polyp, splenic flexure polyp, sigmoid polyp   PROSTATE BIOPSY     THYROID SURGERY  1999   L lobectomy, complicated by esophageal laceration requiring esophageal repair      Family History  Problem Relation Age of Onset   Hypertension Father    Heart attack Father 42   Pelvic inflammatory disease Father    Hypertension Mother    Stroke Mother    Deep vein thrombosis Mother    Arthritis Brother    Pancreatic cancer Paternal Grandfather    Heart attack Maternal Grandfather 78  Social History   Socioeconomic History   Marital status: Married    Spouse name: Not on file   Number of children: 2   Years of education: Not on file   Highest education level: Not on file  Occupational History   Occupation: Retired    Comment: Pharmacist, hospital  Tobacco Use   Smoking status: Never   Smokeless tobacco: Never  Vaping Use   Vaping Use: Never used  Substance and Sexual Activity   Alcohol use: Not Currently    Comment: 1 beer monthly   Drug use: No   Sexual activity: Yes    Birth control/protection: None  Other Topics Concern   Not on file  Social History Narrative   Lives at home with wife in [place].   Retired Transport planner.  Enjoys traveling and photography.   Social Determinants of Health   Financial Resource Strain: Not on file  Food Insecurity: Not on file   Transportation Needs: Not on file  Physical Activity: Not on file  Stress: Not on file  Social Connections: Not on file  Intimate Partner Violence: Not on file     BP 126/76   Pulse 60   Ht '6\' 4"'$  (1.93 m)   Wt 271 lb (122.9 kg)   SpO2 97%   BMI 32.99 kg/m   Physical Exam:  Well appearing NAD HEENT: Unremarkable Neck:  No JVD, no thyromegally Lymphatics:  No adenopathy Back:  No CVA tenderness Lungs:  Clear HEART:  Regular rate rhythm, no murmurs, no rubs, no clicks Abd:  soft, positive bowel sounds, no organomegally, no rebound, no guarding Ext:  2 plus pulses, no edema, no cyanosis, no clubbing Skin:  No rashes no nodules Neuro:  CN II through XII intact, motor grossly intact   DEVICE  Normal device function.  See PaceArt for details. Approaching ERI.  Assess/Plan: CHB - he is stable s/p PPM Insertion. No escape today. PPM -his medtronic DDD PM is working normally though he is approaching ERI. We discussed the options for gen change, with and without lead revision. As he is doing ok and his threshold is stable, we will plan not to place a new lead. If his threshold changes or his symptoms worsen, then we would reconsider. Today his threshold was 1.25 at 0.6.  Jonathan Lamb Jonathan Castile,MD

## 2022-08-13 NOTE — Patient Instructions (Signed)
Medication Instructions:  Your physician recommends that you continue on your current medications as directed. Please refer to the Current Medication list given to you today.  *If you need a refill on your cardiac medications before your next appointment, please call your pharmacy*   Lab Work: NONE   If you have labs (blood work) drawn today and your tests are completely normal, you will receive your results only by: [place] (if you have MyChart) OR A paper copy in the mail If you have any lab test that is abnormal or we need to change your treatment, we will call you to review the results.   Testing/Procedures: NONE    Follow-Up: At [facility], you and your health needs are our priority.  As part of our continuing mission to provide you with exceptional heart care, we have created designated Provider Care Teams.  These Care Teams include your primary Cardiologist (physician) and Advanced Practice Providers (APPs -  Physician Assistants and Nurse Practitioners) who all work together to provide you with the care you need, when you need it.  We recommend signing up for the patient portal called "MyChart".  Sign up information is provided on this After Visit Summary.  MyChart is used to connect with patients for Virtual Visits (Telemedicine).  Patients are able to view lab/test results, encounter notes, upcoming appointments, etc.  Non-urgent messages can be sent to your provider as well.   To learn more about what you can do with MyChart, go to [email or web address].    Your next appointment:   6 month(s)  The format for your next appointment:   In Person  Provider:   Cristopher Peru, MD    Other Instructions Thank you for choosing [facility]!    Important Information About Sugar

## 2022-08-16 NOTE — Progress Notes (Signed)
Remote pacemaker transmission.   

## 2022-08-20 DIAGNOSIS — Z1331 Encounter for screening for depression: Secondary | ICD-10-CM | POA: Diagnosis not present

## 2022-08-20 DIAGNOSIS — Z23 Encounter for immunization: Secondary | ICD-10-CM | POA: Diagnosis not present

## 2022-08-20 DIAGNOSIS — Z6833 Body mass index (BMI) 33.0-33.9, adult: Secondary | ICD-10-CM | POA: Diagnosis not present

## 2022-08-20 DIAGNOSIS — Z0001 Encounter for general adult medical examination with abnormal findings: Secondary | ICD-10-CM | POA: Diagnosis not present

## 2022-08-20 DIAGNOSIS — I10 Essential (primary) hypertension: Secondary | ICD-10-CM | POA: Diagnosis not present

## 2022-08-20 DIAGNOSIS — I471 Supraventricular tachycardia, unspecified: Secondary | ICD-10-CM | POA: Diagnosis not present

## 2022-08-20 DIAGNOSIS — Z1389 Encounter for screening for other disorder: Secondary | ICD-10-CM | POA: Diagnosis not present

## 2022-08-20 DIAGNOSIS — E7849 Other hyperlipidemia: Secondary | ICD-10-CM | POA: Diagnosis not present

## 2022-09-13 ENCOUNTER — Ambulatory Visit (INDEPENDENT_AMBULATORY_CARE_PROVIDER_SITE_OTHER): Payer: Medicare PPO

## 2022-09-13 DIAGNOSIS — I441 Atrioventricular block, second degree: Secondary | ICD-10-CM

## 2022-09-13 LAB — CUP PACEART REMOTE DEVICE CHECK
Battery Remaining Longevity: 1 mo
Battery Voltage: 2.65 V
Brady Statistic AP VP Percent: 42.92 %
Brady Statistic AP VS Percent: 0 %
Brady Statistic AS VP Percent: 57.03 %
Brady Statistic AS VS Percent: 0.05 %
Brady Statistic RA Percent Paced: 42.93 %
Brady Statistic RV Percent Paced: 99.95 %
Date Time Interrogation Session: 20240112124219
Implantable Lead Connection Status: 753985
Implantable Lead Connection Status: 753985
Implantable Lead Implant Date: 20180824
Implantable Lead Implant Date: 20180824
Implantable Lead Location: 753859
Implantable Lead Location: 753860
Implantable Lead Model: 5076
Implantable Lead Model: 5076
Implantable Pulse Generator Implant Date: 20180824
Lead Channel Impedance Value: 304 Ohm
Lead Channel Impedance Value: 323 Ohm
Lead Channel Impedance Value: 323 Ohm
Lead Channel Impedance Value: 399 Ohm
Lead Channel Pacing Threshold Amplitude: 0.5 V
Lead Channel Pacing Threshold Amplitude: 1.625 V
Lead Channel Pacing Threshold Pulse Width: 0.4 ms
Lead Channel Pacing Threshold Pulse Width: 0.4 ms
Lead Channel Sensing Intrinsic Amplitude: 12.125 mV
Lead Channel Sensing Intrinsic Amplitude: 12.125 mV
Lead Channel Sensing Intrinsic Amplitude: 3.25 mV
Lead Channel Sensing Intrinsic Amplitude: 3.25 mV
Lead Channel Setting Pacing Amplitude: 1.5 V
Lead Channel Setting Pacing Amplitude: 2.5 V
Lead Channel Setting Pacing Pulse Width: 0.8 ms
Lead Channel Setting Sensing Sensitivity: 0.9 mV
Zone Setting Status: 755011
Zone Setting Status: 755011

## 2022-10-01 NOTE — Progress Notes (Signed)
Remote pacemaker transmission.   

## 2022-10-22 ENCOUNTER — Ambulatory Visit (INDEPENDENT_AMBULATORY_CARE_PROVIDER_SITE_OTHER): Payer: Medicare PPO

## 2022-10-22 DIAGNOSIS — I441 Atrioventricular block, second degree: Secondary | ICD-10-CM

## 2022-10-23 LAB — CUP PACEART REMOTE DEVICE CHECK
Battery Remaining Longevity: 1 mo — CL
Battery Voltage: 2.63 V
Brady Statistic AP VP Percent: 45.43 %
Brady Statistic AP VS Percent: 0 %
Brady Statistic AS VP Percent: 54.52 %
Brady Statistic AS VS Percent: 0.05 %
Brady Statistic RA Percent Paced: 45.44 %
Brady Statistic RV Percent Paced: 99.95 %
Date Time Interrogation Session: 20240220081336
Implantable Lead Connection Status: 753985
Implantable Lead Connection Status: 753985
Implantable Lead Implant Date: 20180824
Implantable Lead Implant Date: 20180824
Implantable Lead Location: 753859
Implantable Lead Location: 753860
Implantable Lead Model: 5076
Implantable Lead Model: 5076
Implantable Pulse Generator Implant Date: 20180824
Lead Channel Impedance Value: 285 Ohm
Lead Channel Impedance Value: 323 Ohm
Lead Channel Impedance Value: 323 Ohm
Lead Channel Impedance Value: 437 Ohm
Lead Channel Pacing Threshold Amplitude: 0.375 V
Lead Channel Pacing Threshold Amplitude: 1.875 V
Lead Channel Pacing Threshold Pulse Width: 0.4 ms
Lead Channel Pacing Threshold Pulse Width: 0.4 ms
Lead Channel Sensing Intrinsic Amplitude: 1.5 mV
Lead Channel Sensing Intrinsic Amplitude: 1.5 mV
Lead Channel Sensing Intrinsic Amplitude: 12.125 mV
Lead Channel Sensing Intrinsic Amplitude: 12.125 mV
Lead Channel Setting Pacing Amplitude: 1.5 V
Lead Channel Setting Pacing Amplitude: 2.5 V
Lead Channel Setting Pacing Pulse Width: 0.8 ms
Lead Channel Setting Sensing Sensitivity: 0.9 mV
Zone Setting Status: 755011
Zone Setting Status: 755011

## 2022-11-12 ENCOUNTER — Ambulatory Visit (INDEPENDENT_AMBULATORY_CARE_PROVIDER_SITE_OTHER): Payer: Medicare PPO

## 2022-11-12 DIAGNOSIS — I441 Atrioventricular block, second degree: Secondary | ICD-10-CM

## 2022-11-14 LAB — CUP PACEART REMOTE DEVICE CHECK
Battery Remaining Longevity: 1 mo — CL
Battery Voltage: 2.63 V
Brady Statistic AP VP Percent: 52.45 %
Brady Statistic AP VS Percent: 0 %
Brady Statistic AS VP Percent: 47.5 %
Brady Statistic AS VS Percent: 0.05 %
Brady Statistic RA Percent Paced: 52.46 %
Brady Statistic RV Percent Paced: 99.95 %
Date Time Interrogation Session: 20240312033800
Implantable Lead Connection Status: 753985
Implantable Lead Connection Status: 753985
Implantable Lead Implant Date: 20180824
Implantable Lead Implant Date: 20180824
Implantable Lead Location: 753859
Implantable Lead Location: 753860
Implantable Lead Model: 5076
Implantable Lead Model: 5076
Implantable Pulse Generator Implant Date: 20180824
Lead Channel Impedance Value: 304 Ohm
Lead Channel Impedance Value: 342 Ohm
Lead Channel Impedance Value: 361 Ohm
Lead Channel Impedance Value: 475 Ohm
Lead Channel Pacing Threshold Amplitude: 0.5 V
Lead Channel Pacing Threshold Amplitude: 1.875 V
Lead Channel Pacing Threshold Pulse Width: 0.4 ms
Lead Channel Pacing Threshold Pulse Width: 0.4 ms
Lead Channel Sensing Intrinsic Amplitude: 12.125 mV
Lead Channel Sensing Intrinsic Amplitude: 12.125 mV
Lead Channel Sensing Intrinsic Amplitude: 4.125 mV
Lead Channel Sensing Intrinsic Amplitude: 4.125 mV
Lead Channel Setting Pacing Amplitude: 1.5 V
Lead Channel Setting Pacing Amplitude: 2.5 V
Lead Channel Setting Pacing Pulse Width: 0.8 ms
Lead Channel Setting Sensing Sensitivity: 0.9 mV
Zone Setting Status: 755011
Zone Setting Status: 755011

## 2022-11-15 ENCOUNTER — Encounter: Payer: Self-pay | Admitting: Internal Medicine

## 2022-11-18 ENCOUNTER — Telehealth: Payer: Self-pay

## 2022-11-18 NOTE — Telephone Encounter (Signed)
Following alert received from [employer] received for Alert remote reviewed. Normal device function.  The device triggered ERI today, sent to triage.  Patient called made aware PPM reached RRT 11/15/22 and will route to RN to follow up about apt/gen change. Pt voiced understanding and appreciative of call.

## 2022-11-22 NOTE — Progress Notes (Signed)
Remote pacemaker transmission.   

## 2022-11-26 NOTE — Progress Notes (Unsigned)
Cardiology Office Note Date:  11/28/2022  Patient ID:  Jonathan Lamb, Jonathan Lamb 04/08/1946, MRN [id number] PCP:  Manon Hilding, MD  Cardiologist:  Rozann Lesches, MD Electrophysiologist: Cristopher Peru, MD   Chief Complaint: PPM at [facility], discuss gen change  History of Present Illness: Jonathan Lamb is a 77 y.o. male with PMH notable for CHB s/p PPM, HTN, h/o atach; seen today for Cristopher Peru, MD for acute visit due to RRT on device. He last saw Dr. Lovena Le 08/2022, ventricular threshold a little elevated at 1.41mV at 0.82ms. He was nearing ERI and the decision was made to proceed with gen change only when the time came (and not lead revision) as long as threshold remained stable.   He met ERI on 11/15/22, and presents today to discuss gen change procedure.   He is feeling well currently, had covid in mid-January and has fully recovered.   he denies chest pain, palpitations, dyspnea, PND, orthopnea, nausea, vomiting, dizziness, syncope, edema, weight gain, or early satiety.     Device Information: MDT dual chamber PPM, imp 04/2017, dx 2nd deg HB  Past Medical History:  Diagnosis Date   Arthritis    Benign prostatic hyperplasia    Essential hypertension    Hemorrhoid    Hypercholesterolemia    RBBB    Second degree heart block    Medtronic pacemaker -Dr. Rayann Heman   SVT (supraventricular tachycardia)    Atrial tachycardia    Past Surgical History:  Procedure Laterality Date   COLONOSCOPY N/A 03/24/2013   Procedure: COLONOSCOPY;  Surgeon: Rogene Houston, MD;  [facility];  Service: Endoscopy;  Laterality: N/A;  830   COLONOSCOPY N/A 08/05/2018   Procedure: COLONOSCOPY;  Surgeon: Rogene Houston, MD;  [facility];  Service: Endoscopy;  Laterality: N/A;  12   LAMINECTOMY     Lumbar   PACEMAKER IMPLANT N/A 04/25/2017   MDT [place] XT DR MRI implanted by Dr Rayann Heman for second degree AV block   POLYPECTOMY  08/05/2018   Procedure: POLYPECTOMY;  Surgeon: Rogene Houston, MD;  [facility];  Service: Endoscopy;;  ascending polyp, splenic flexure polyp, sigmoid polyp   PROSTATE BIOPSY     THYROID SURGERY  1999   L lobectomy, complicated by esophageal laceration requiring esophageal repair     Current Outpatient Medications  Medication Instructions   amLODipine (NORVASC) 10 MG tablet TAKE 1 TABLET BY MOUTH EVERY DAY   aspirin 81 mg, Oral, Every evening   Carboxymethylcellul-Glycerin (LUBRICATING EYE DROPS OP) 1 drop, Both Eyes, Daily PRN   finasteride (PROSCAR) 5 mg, Oral, Every evening   Fish Oil 1,200 mg, Oral, Every evening   metoprolol succinate (TOPROL-XL) 25 MG 24 hr tablet TAKE 1 TABLET(25 MG) BY MOUTH DAILY   Multiple Vitamin (MULTIVITAMIN) tablet 1 tablet, Oral, Every evening   multivitamin-lutein (OCUVITE-LUTEIN) CAPS capsule 1 capsule, Oral, Every evening   naproxen sodium (ALEVE) 440 mg, Oral, Daily PRN   simvastatin (ZOCOR) 20 mg, Oral, Every evening   tamsulosin (FLOMAX) 0.4 mg, Oral, Every evening   Vitamin D3 1,000 Units, Oral, Daily    Social History:  The patient  reports that he has never smoked. He has never used smokeless tobacco. He reports that he does not currently use alcohol. He reports that he does not use drugs.   Family History:  The patient's family history includes Arthritis in his brother; Deep vein thrombosis in his mother; Heart attack (age of onset:  43) in his maternal grandfather; Heart attack (age of onset: 71) in his father; Hypertension in his father and mother; Pancreatic cancer in his paternal grandfather; Pelvic inflammatory disease in his father; Stroke in his mother.  ROS:  Please see the history of present illness. All other systems are reviewed and otherwise negative.   PHYSICAL EXAM:  VS:  BP 129/80 (BP Location: Left Arm, Patient Position: Sitting, Cuff Size: Normal)   Pulse 60   Ht 6\' 4"  (1.93 m)   Wt 270 lb (122.5 kg)   BMI 32.87 kg/m  BMI: Body mass index is 32.87 kg/m.  GEN-  The patient is well appearing, alert and oriented x 3 today.   HEENT: normocephalic, atraumatic; sclera clear, conjunctiva pink; hearing intact; oropharynx clear; neck supple, no JVP Lungs- Clear to ausculation bilaterally, normal work of breathing.  No wheezes, rales, rhonchi Heart- Regular rate and rhythm, no murmurs, rubs or gallops, PMI not laterally displaced GI- soft, non-tender, non-distended, bowel sounds present, no hepatosplenomegaly Extremities- No peripheral edema. no clubbing or cyanosis; DP/PT/radial pulses 2+ bilaterally MS- no significant deformity or atrophy Skin- warm and dry, no rash or lesion,  device pocket well-healed Psych- euthymic mood, full affect Neuro- strength and sensation are intact   Device interrogation done today and reviewed by myself:  [facility]attery at [facility] 11/15/22 Dependent No R waves at 30bpm at VVI V-threshold slightly higher today at 1.44mV at 0.29ms (previously 1.25 @ 0.6) Impendences stable no episodes no changes made today  EKG is ordered. Personal review of EKG from today shows:  AV paced at 60bpm, wide paced QRS at 129ms  Recent Labs: No results found for requested labs within last 365 days.  No results found for requested labs within last 365 days.   CrCl cannot be calculated (Patient's most recent lab result is older than the maximum 21 days allowed.).   Wt Readings from Last 3 Encounters:  11/28/22 270 lb (122.5 kg)  08/13/22 271 lb (122.9 kg)  08/01/22 277 lb (125.6 kg)     Additional studies reviewed include: Previous EP, cardiology notes.     ASSESSMENT AND PLAN:  #) CHB s/p MDT PPM Normal device functioning, see paceart for details Dependent  ERI on 11/15/22 Scheduled for 4/24, but requests sooner if becomes available Reviewed slightly elevated RV threshold with Dr. Lovena Le, no plans to revise lead at this time Discussed risks/benefits of generator change procedure with patient and wife. Patient verbalized understanding and  wished to proceed.   #) HTN Normotensive today in office Cont to monitor   Current medicines are reviewed at length with the patient today.   The patient does not have concerns regarding his medicines.  The following changes were made today:  none  Labs/ tests ordered today include:  No orders of the defined types were placed in this encounter.    Disposition: Follow up with Dr. Lovena Le in as usual post procedure   Signed, Mamie Levers, NP  11/28/22  11:49 AM  Electrophysiology [facility]

## 2022-11-28 ENCOUNTER — Encounter: Payer: Self-pay | Admitting: Cardiology

## 2022-11-28 ENCOUNTER — Ambulatory Visit: Payer: Medicare PPO | Attending: Physician Assistant | Admitting: Cardiology

## 2022-11-28 VITALS — BP 129/80 | HR 60 | Ht 76.0 in | Wt 270.0 lb

## 2022-11-28 DIAGNOSIS — Z959 Presence of cardiac and vascular implant and graft, unspecified: Secondary | ICD-10-CM

## 2022-11-28 DIAGNOSIS — I10 Essential (primary) hypertension: Secondary | ICD-10-CM | POA: Diagnosis not present

## 2022-11-28 DIAGNOSIS — I441 Atrioventricular block, second degree: Secondary | ICD-10-CM

## 2022-11-28 LAB — CUP PACEART INCLINIC DEVICE CHECK
Date Time Interrogation Session: 20240328115253
Implantable Lead Connection Status: 753985
Implantable Lead Connection Status: 753985
Implantable Lead Implant Date: 20180824
Implantable Lead Implant Date: 20180824
Implantable Lead Location: 753859
Implantable Lead Location: 753860
Implantable Lead Model: 5076
Implantable Lead Model: 5076
Implantable Pulse Generator Implant Date: 20180824

## 2022-11-28 NOTE — Patient Instructions (Signed)
Medication Instructions:   Your physician recommends that you continue on your current medications as directed. Please refer to the Current Medication list given to you today.   *If you need a refill on your cardiac medications before your next appointment, please call your pharmacy*   Lab Work:  BMET AND CBC TODAY    If you have labs (blood work) drawn today and your tests are completely normal, you will receive your results only by: [place] (if you have MyChart) OR A paper copy in the mail If you have any lab test that is abnormal or we need to change your treatment, we will call you to review the results.   Testing/Procedures: SEE LETTER  FOR DEVICE CHANGE  ON  12-25-22    Follow-Up: At [facility], you and your health needs are our priority.  As part of our continuing mission to provide you with exceptional heart care, we have created designated Provider Care Teams.  These Care Teams include your primary Cardiologist (physician) and Advanced Practice Providers (APPs -  Physician Assistants and Nurse Practitioners) who all work together to provide you with the care you need, when you need it.  We recommend signing up for the patient portal called "MyChart".  Sign up information is provided on this After Visit Summary.  MyChart is used to connect with patients for Virtual Visits (Telemedicine).  Patients are able to view lab/test results, encounter notes, upcoming appointments, etc.  Non-urgent messages can be sent to your provider as well.   To learn more about what you can do with MyChart, go to [email or web address].    Your next appointment:  10-14 DAYS AFTER  12-25-22    WITH DEVICE CLINIC WOUND CHECK   3 month(s)    Provider:   You may see Cristopher Peru, MD   Other Instructions     Implantable Device Instructions    Jonathan Lamb  11/28/2022  You are scheduled for a PPM generator change on Wednesday, April 24 with Dr. Cristopher Peru.  1. Pre  procedure Lab testing: You will come to the [facility] office on Thursday, March 28 any time between 8:00 am and 4:30 pm.  You do NOT need to be fasting.   2. Please arrive at the Main Entrance A at [facility]: [place], [place] [address] on March 28 at 1:30 PM (This time is two hours before your procedure to ensure your preparation). Free valet parking service is available. You will check in at ADMITTING. The support person will be asked to wait in the waiting room.  It is OK to have someone drop you off and come back when you are ready to be discharged.        Special note: Every effort is made to have your procedure done on time. Please understand that emergencies sometimes delay  scheduled procedures.  3.  No eating or drinking after midnight prior to procedure.     4.  Medication instructions:  On the morning of your procedure you may take your medicines with a sip of water      5.  The night before your procedure and the morning of your procedure scrub your neck/chest with CHG surgical scrub.  See instruction letter.  6. Plan to go home the same day, you will only stay overnight if medically necessary. 7. You MUST have a responsible adult to drive you home. 8. An adult MUST be with you the first 24 hours  after you arrive home. 9. Bring a current list of your medications, and the last time and date medication taken. 10. Bring ID and current insurance cards. 11. Please wear clothes that are easy to get on and off and wear slip-on shoes.    You will follow up with the [facility] 10-14 days after your procedure.  You will follow up with Dr. Cristopher Peru 91 days after your procedure.  These appointments will be made for you.   * If you have ANY questions after you get home, please call the office at [phone] or send a MyChart message.  FYI: For your safety, and to allow [place] to monitor your vital signs accurately during the surgery/procedure we request  that if you have artificial nails, gel coating, SNS etc. Please have those removed prior to your surgery/procedure. Not having the nail coverings /polish removed may result in cancellation or delay of your surgery/procedure.    [facility] - Preparing For Surgery    Before surgery, you can play an important role. Because skin is not sterile, your skin needs to be as free of germs as possible. You can reduce the number of germs on your skin by washing with CHG (chlorahexidine gluconate) Soap before surgery.  CHG is an antiseptic cleaner which kills germs and bonds with the skin to continue killing germs even after washing.  Please do not use if you have an allergy to CHG or antibacterial soaps.  If your skin becomes reddened/irritated stop using the CHG.   Do not shave (including legs and underarms) for at least 48 hours prior to first CHG shower.  It is OK to shave your face.  Please follow these instructions carefully:  1.  Shower the night before surgery and the morning of surgery with CHG.  2.  If you choose to wash your hair, wash your hair first as usual with your normal shampoo.  3.  After you shampoo, rinse your hair and body thoroughly to remove the shampoo.  4.  Use CHG as you would any other liquid soap.  You can apply CHG directly to the skin and wash gently with a clean washcloth. 5.  Apply the CHG Soap to your body ONLY FROM THE NECK DOWN.  Do not use on open wounds or open sores.  Avoid contact with your eyes, ears, mouth and genitals (private parts).  Wash genitals (private parts) with your normal soap.  6.  Wash thoroughly, paying special attention to the area where your surgery will be performed.  7.  Thoroughly rinse your body with warm water from the neck down.   8.  DO NOT shower/wash with your normal soap after using and rinsing off the CHG soap.  9.  Pat yourself dry with a clean towel.           10.  Wear clean pajamas.           11.  Place clean sheets on your bed the  night of your first shower and do not sleep with pets.  Day of Surgery: Do not apply any deodorants/lotions.  Please wear clean clothes to the hospital/surgery center.

## 2022-11-29 LAB — CBC
Hematocrit: 44.7 % (ref 37.5–51.0)
Hemoglobin: 14.9 g/dL (ref 13.0–17.7)
MCH: 29.9 pg (ref 26.6–33.0)
MCHC: 33.3 g/dL (ref 31.5–35.7)
MCV: 90 fL (ref 79–97)
Platelets: 268 10*3/uL (ref 150–450)
RBC: 4.98 x10E6/uL (ref 4.14–5.80)
RDW: 13.1 % (ref 11.6–15.4)
WBC: 6.9 10*3/uL (ref 3.4–10.8)

## 2022-11-29 LAB — BASIC METABOLIC PANEL
BUN/Creatinine Ratio: 16 (ref 10–24)
BUN: 16 mg/dL (ref 8–27)
CO2: 22 mmol/L (ref 20–29)
Calcium: 9.7 mg/dL (ref 8.6–10.2)
Chloride: 106 mmol/L (ref 96–106)
Creatinine, Ser: 1 mg/dL (ref 0.76–1.27)
Glucose: 90 mg/dL (ref 70–99)
Potassium: 4.2 mmol/L (ref 3.5–5.2)
Sodium: 147 mmol/L — ABNORMAL HIGH (ref 134–144)
eGFR: 78 mL/min/{1.73_m2} (ref >59)

## 2022-12-13 NOTE — Pre-Procedure Instructions (Signed)
Instructed patient on the following items: Arrival time 7:30 Nothing to eat or drink after midnight No meds AM of procedure Responsible person to drive you home and stay with you for 24 hrs Wash with special soap night before and morning of procedure

## 2022-12-16 ENCOUNTER — Other Ambulatory Visit: Payer: Self-pay

## 2022-12-16 ENCOUNTER — Ambulatory Visit (HOSPITAL_COMMUNITY)
Admission: RE | Admit: 2022-12-16 | Discharge: 2022-12-16 | Disposition: A | Discharge status: Home / Self Care | Payer: Medicare PPO | Attending: Internal Medicine | Admitting: Internal Medicine

## 2022-12-16 ENCOUNTER — Encounter (HOSPITAL_COMMUNITY): Payer: Self-pay | Admitting: Internal Medicine

## 2022-12-16 ENCOUNTER — Encounter (HOSPITAL_COMMUNITY)
Admission: RE | Disposition: A | Discharge status: Home / Self Care | Payer: Self-pay | Source: Home / Self Care | Attending: Internal Medicine

## 2022-12-16 DIAGNOSIS — I10 Essential (primary) hypertension: Secondary | ICD-10-CM | POA: Diagnosis not present

## 2022-12-16 DIAGNOSIS — Z45010 Encounter for checking and testing of cardiac pacemaker pulse generator [battery]: Secondary | ICD-10-CM | POA: Diagnosis not present

## 2022-12-16 DIAGNOSIS — I442 Atrioventricular block, complete: Secondary | ICD-10-CM | POA: Insufficient documentation

## 2022-12-16 HISTORY — PX: PPM GENERATOR CHANGEOUT: EP1233

## 2022-12-16 SURGERY — PPM GENERATOR CHANGEOUT
Anesthesia: LOCAL

## 2022-12-16 MED ORDER — LIDOCAINE HCL (PF) 1 % IJ SOLN
INTRAMUSCULAR | Status: DC | PRN
  Administered 2022-12-16: 60 mL

## 2022-12-16 MED ORDER — SODIUM CHLORIDE 0.9 % IV SOLN
80.0000 mg | INTRAVENOUS | Status: AC
  Administered 2022-12-16: 80 mg

## 2022-12-16 MED ORDER — CEFAZOLIN SODIUM-DEXTROSE 2-4 GM/100ML-% IV SOLN
2.0000 g | INTRAVENOUS | Status: AC
  Administered 2022-12-16: 2 g via INTRAVENOUS

## 2022-12-16 MED ORDER — SODIUM CHLORIDE 0.9 % IV SOLN: INTRAVENOUS | Status: DC

## 2022-12-16 MED ORDER — CHLORHEXIDINE GLUCONATE 4 % EX LIQD: 4.0000 | Freq: Once | CUTANEOUS | Status: DC

## 2022-12-16 MED ORDER — ACETAMINOPHEN 325 MG PO TABS: 325.0000 mg | ORAL_TABLET | ORAL | Status: DC | PRN

## 2022-12-16 MED ORDER — SODIUM CHLORIDE 0.9 % IV SOLN
INTRAVENOUS | Status: AC
  Filled 2022-12-16: qty 2

## 2022-12-16 MED ORDER — ONDANSETRON HCL 4 MG/2ML IJ SOLN: 4.0000 mg | Freq: Four times a day (QID) | INTRAMUSCULAR | Status: DC | PRN

## 2022-12-16 MED ORDER — CEFAZOLIN SODIUM-DEXTROSE 2-4 GM/100ML-% IV SOLN
INTRAVENOUS | Status: AC
  Filled 2022-12-16: qty 100

## 2022-12-16 MED ORDER — POVIDONE-IODINE 10 % EX SWAB
2.0000 | Freq: Once | CUTANEOUS | Status: AC
  Administered 2022-12-16: 2 via TOPICAL

## 2022-12-16 MED ORDER — LIDOCAINE HCL (PF) 1 % IJ SOLN
INTRAMUSCULAR | Status: AC
  Filled 2022-12-16: qty 60

## 2022-12-16 SURGICAL SUPPLY — 7 items
CABLE SURGICAL S-101-97-12 (CABLE) ×1 IMPLANT
IPG PACE AZUR XT DR MRI W1DR01 (Pacemaker) IMPLANT
PACE AZURE XT DR MRI W1DR01 (Pacemaker) ×1 IMPLANT
PAD DEFIB RADIO PHYSIO CONN (PAD) ×1 IMPLANT
POUCH AIGIS-R ANTIBACT PPM (Mesh General) ×1 IMPLANT
POUCH AIGIS-R ANTIBACT PPM MED (Mesh General) IMPLANT
TRAY PACEMAKER INSERTION (PACKS) ×1 IMPLANT

## 2022-12-16 NOTE — Discharge Instructions (Signed)
Implantable Cardiac Device Battery Change, Care After  This sheet gives you information about how to care for yourself after your procedure. Your health care provider may also give you more specific instructions. If you have problems or questions, contact your health care provider. What can I expect after the procedure? After your procedure, it is common to have: Pain or soreness at the site where the cardiac device was inserted. Swelling at the site where the cardiac device was inserted. You should received an information card for your new device in 4-8 weeks. Follow these instructions at home: Incision care  Keep the incision clean and dry. REMOVE OUTER DRESSING IN 24 HOURS/ KEEP SITE DRY Do not take baths, swim, or use a hot tub until after your wound check.  Do not shower for at least 7 days, or as directed by your health care provider. Pat the area dry with a clean towel. Do not rub the area. This may cause bleeding. Follow instructions from your health care provider about how to take care of your incision. Make sure you: LEAVE adhesive strips in place. These skin closures may need to stay in place for 2 weeks or longer. If adhesive strip edges start to loosen and curl up, you may trim the loose edges. Do not remove adhesive strips completely unless your health care provider tells you to do that. Check your incision area every day for signs of infection. Check for: More redness, swelling, or pain. More fluid or blood. Warmth. Pus or a bad smell. Activity Do not lift anything that is heavier than 10 lb (4.5 kg) until your health care provider says it is okay to do so. For the first week, or as long as told by your health care provider: Avoid lifting your affected arm higher than your shoulder. After 1 week, Be gentle when you move your arms over your head. It is okay to raise your arm to comb your hair. Avoid strenuous exercise. Ask your health care provider when it is okay to: Resume  your normal activities. Return to work or school. Resume sexual activity. Eating and drinking Eat a heart-healthy diet. This should include plenty of fresh fruits and vegetables, whole grains, low-fat dairy products, and lean protein like chicken and fish. Limit alcohol intake to no more than 1 drink a day for non-pregnant women and 2 drinks a day for men. One drink equals 12 oz of beer, 5 oz of wine, or 1 oz of hard liquor. Check ingredients and nutrition facts on packaged foods and beverages. Avoid the following types of food: Food that is high in salt (sodium). Food that is high in saturated fat, like full-fat dairy or red meat. Food that is high in trans fat, like fried food. Food and drinks that are high in sugar. Lifestyle Do not use any products that contain nicotine or tobacco, such as cigarettes and e-cigarettes. If you need help quitting, ask your health care provider. Take steps to manage and control your weight. Once cleared, get regular exercise. Aim for 150 minutes of moderate-intensity exercise (such as walking or yoga) or 75 minutes of vigorous exercise (such as running or swimming) each week. Manage other health problems, such as diabetes or high blood pressure. Ask your health care provider how you can manage these conditions. General instructions Do not drive for 24 hours after your procedure if you were given a medicine to help you relax (sedative). Take over-the-counter and prescription medicines only as told by your health care  provider. Avoid putting pressure on the area where the cardiac device was placed. If you need an MRI after your cardiac device has been placed, be sure to tell the health care provider who orders the MRI that you have a cardiac device. Avoid close and prolonged exposure to electrical devices that have strong magnetic fields. These include: Cell phones. Avoid keeping them in a pocket near the cardiac device, and try using the ear opposite the  cardiac device. MP3 players. Household appliances, like microwaves. Metal detectors. Electric generators. High-tension wires. Keep all follow-up visits as directed by your health care provider. This is important. Contact a health care provider if: You have pain at the incision site that is not relieved by over-the-counter or prescription medicines. You have any of these around your incision site or coming from it: More redness, swelling, or pain. Fluid or blood. Warmth to the touch. Pus or a bad smell. You have a fever. You feel brief, occasional palpitations, light-headedness, or any symptoms that you think might be related to your heart. Get help right away if: You experience chest pain that is different from the pain at the cardiac device site. You develop a red streak that extends above or below the incision site. You experience shortness of breath. You have palpitations or an irregular heartbeat. You have light-headedness that does not go away quickly. You faint or have dizzy spells. Your pulse suddenly drops or increases rapidly and does not return to normal. You begin to gain weight and your legs and ankles swell. Summary After your procedure, it is common to have pain, soreness, and some swelling where the cardiac device was inserted. Make sure to keep your incision clean and dry. Follow instructions from your health care provider about how to take care of your incision. Check your incision every day for signs of infection, such as more pain or swelling, pus or a bad smell, warmth, or leaking fluid and blood. Avoid strenuous exercise and lifting your left arm higher than your shoulder for 2 weeks, or as long as told by your health care provider. This information is not intended to replace advice given to you by your health care provider. Make sure you discuss any questions you have with your health care provider.

## 2022-12-16 NOTE — H&P (Signed)
    HPI Jonathan Lamb returns today for followup. He is a pleasant 76 yo man with CHB s/p PPM insertion. He had been followed by Jonathan Lamb and returns today for ongoing arrhythmias followup. He also sees Jonathan Lamb. He has HTN and a h/o AT. His pacing threshold has been up a little. He denies chest pain, sob, or worsening edema.  No Known Allergies   Current Outpatient Medications  Medication Sig Dispense Refill   amLODipine (NORVASC) 10 MG tablet TAKE 1 TABLET BY MOUTH EVERY DAY 180 tablet 3   aspirin 81 MG tablet Take 1 tablet (81 mg total) by mouth every evening. 30 tablet    Carboxymethylcellul-Glycerin (LUBRICATING EYE DROPS OP) Place 1 drop into both eyes daily as needed (dry eyes).     Cholecalciferol (VITAMIN D3) 25 MCG (1000 UT) CAPS Take 1,000 Units by mouth daily.     finasteride (PROSCAR) 5 MG tablet Take 5 mg by mouth every evening.      metoprolol succinate (TOPROL-XL) 25 MG 24 hr tablet TAKE 1 TABLET(25 MG) BY MOUTH DAILY 90 tablet 2   Multiple Vitamin (MULTIVITAMIN) tablet Take 1 tablet by mouth every evening.      multivitamin-lutein (OCUVITE-LUTEIN) CAPS capsule Take 1 capsule by mouth every evening.      naproxen sodium (ALEVE) 220 MG tablet Take 440 mg by mouth daily as needed (pain).      Omega-3 Fatty Acids (FISH OIL) 1200 MG CAPS Take 1,200 mg by mouth every evening.     simvastatin (ZOCOR) 20 MG tablet Take 20 mg by mouth every evening.      tamsulosin (FLOMAX) 0.4 MG CAPS capsule Take 0.4 mg by mouth every evening.      No current facility-administered medications for this visit.     Past Medical History:  Diagnosis Date   Arthritis    Benign prostatic hyperplasia    Essential hypertension    Hemorrhoid    Hypercholesterolemia    RBBB    Second degree heart block    Medtronic pacemaker -Dr. Allred   SVT (supraventricular tachycardia)    Atrial tachycardia    ROS:   All systems reviewed and negative except as noted in the HPI.   Past Surgical  History:  Procedure Laterality Date   COLONOSCOPY N/A 03/24/2013   Procedure: COLONOSCOPY;  Surgeon: [name], MD;  [facility];  Service: Endoscopy;  Laterality: N/A;  830   COLONOSCOPY N/A 08/05/2018   Procedure: COLONOSCOPY;  Surgeon: Rehman, Najeeb U, MD;  [facility];  Service: Endoscopy;  Laterality: N/A;  12   LAMINECTOMY     Lumbar   PACEMAKER IMPLANT N/A 04/25/2017   MDT [place] XT DR MRI implanted by Dr Allred for second degree AV block   POLYPECTOMY  08/05/2018   Procedure: POLYPECTOMY;  Surgeon: Rehman, Najeeb U, MD;  [facility];  Service: Endoscopy;;  ascending polyp, splenic flexure polyp, sigmoid polyp   PROSTATE BIOPSY     THYROID SURGERY  1999   L lobectomy, complicated by esophageal laceration requiring esophageal repair      Family History  Problem Relation Age of Onset   Hypertension Father    Heart attack Father 74   Pelvic inflammatory disease Father    Hypertension Mother    Stroke Mother    Deep vein thrombosis Mother    Arthritis Brother    Pancreatic cancer Paternal Grandfather    Heart attack Maternal Grandfather 56       Social History   Socioeconomic History   Marital status: Married    Spouse name: Not on file   Number of children: 2   Years of education: Not on file   Highest education level: Not on file  Occupational History   Occupation: Retired    Comment: Teacher  Tobacco Use   Smoking status: Never   Smokeless tobacco: Never  Vaping Use   Vaping Use: Never used  Substance and Sexual Activity   Alcohol use: Not Currently    Comment: 1 beer monthly   Drug use: No   Sexual activity: Yes    Birth control/protection: None  Other Topics Concern   Not on file  Social History Narrative   Lives at home with wife in [place].   Retired school teachers.  Enjoys traveling and photography.   Social Determinants of Health   Financial Resource Strain: Not on file  Food Insecurity: Not on file   Transportation Needs: Not on file  Physical Activity: Not on file  Stress: Not on file  Social Connections: Not on file  Intimate Partner Violence: Not on file     BP 126/76   Pulse 60   Ht 6' 4" (1.93 m)   Wt 271 lb (122.9 kg)   SpO2 97%   BMI 32.99 kg/m   Physical Exam:  Well appearing NAD HEENT: Unremarkable Neck:  No JVD, no thyromegally Lymphatics:  No adenopathy Back:  No CVA tenderness Lungs:  Clear HEART:  Regular rate rhythm, no murmurs, no rubs, no clicks Abd:  soft, positive bowel sounds, no organomegally, no rebound, no guarding Ext:  2 plus pulses, no edema, no cyanosis, no clubbing Skin:  No rashes no nodules Neuro:  CN II through XII intact, motor grossly intact   DEVICE  Normal device function.  See PaceArt for details. Approaching ERI.  Assess/Plan: CHB - he is stable s/p PPM Insertion. No escape today. PPM -his medtronic DDD PM is working normally though he is approaching ERI. We discussed the options for gen change, with and without lead revision. As he is doing ok and his threshold is stable, we will plan not to place a new lead. If his threshold changes or his symptoms worsen, then we would reconsider. Today his threshold was 1.25 at 0.6.  [name],MD 

## 2022-12-24 NOTE — Progress Notes (Signed)
Remote pacemaker transmission.   

## 2022-12-24 NOTE — Addendum Note (Signed)
Addended by: Elease Etienne A on: 12/24/2022 08:52 AM   Modules accepted: Level of Service

## 2022-12-30 ENCOUNTER — Ambulatory Visit: Payer: Medicare PPO | Admitting: Student

## 2023-01-01 ENCOUNTER — Ambulatory Visit: Payer: Medicare PPO | Attending: Cardiovascular Disease

## 2023-01-01 DIAGNOSIS — I442 Atrioventricular block, complete: Secondary | ICD-10-CM | POA: Diagnosis not present

## 2023-01-01 LAB — CUP PACEART INCLINIC DEVICE CHECK
Battery Remaining Longevity: 112 mo
Battery Voltage: 3.2 V
Brady Statistic AP VP Percent: 54.7 %
Brady Statistic AP VS Percent: 0 %
Brady Statistic AS VP Percent: 45.27 %
Brady Statistic AS VS Percent: 0.03 %
Brady Statistic RA Percent Paced: 54.7 %
Brady Statistic RV Percent Paced: 99.97 %
Date Time Interrogation Session: 20240501173122
Implantable Lead Connection Status: 753985
Implantable Lead Connection Status: 753985
Implantable Lead Implant Date: 20180824
Implantable Lead Implant Date: 20180824
Implantable Lead Location: 753859
Implantable Lead Location: 753860
Implantable Lead Model: 5076
Implantable Lead Model: 5076
Implantable Pulse Generator Implant Date: 20240415
Lead Channel Impedance Value: 266 Ohm
Lead Channel Impedance Value: 323 Ohm
Lead Channel Impedance Value: 323 Ohm
Lead Channel Impedance Value: 513 Ohm
Lead Channel Pacing Threshold Amplitude: 0.375 V
Lead Channel Pacing Threshold Amplitude: 0.5 V
Lead Channel Pacing Threshold Amplitude: 1.5 V
Lead Channel Pacing Threshold Amplitude: 1.75 V
Lead Channel Pacing Threshold Pulse Width: 0.4 ms
Lead Channel Pacing Threshold Pulse Width: 0.4 ms
Lead Channel Pacing Threshold Pulse Width: 0.4 ms
Lead Channel Pacing Threshold Pulse Width: 0.4 ms
Lead Channel Sensing Intrinsic Amplitude: 3.5 mV
Lead Channel Sensing Intrinsic Amplitude: 3.75 mV
Lead Channel Setting Pacing Amplitude: 1.5 V
Lead Channel Setting Pacing Amplitude: 3.5 V
Lead Channel Setting Pacing Pulse Width: 0.4 ms
Lead Channel Setting Sensing Sensitivity: 2 mV
Zone Setting Status: 755011
Zone Setting Status: 755011

## 2023-01-01 NOTE — Progress Notes (Signed)
Wound check appointment. Steri-strips removed. Wound without redness or edema. Incision edges approximated, wound well healed. Normal device function. Thresholds, sensing, and impedances consistent with implant measurements. Device programmed appropriately for chronic lead safety margins.  NOTE:  RV threshold tested 1.75v@.4ms today.  He was 1.7@.5ms at gen change and acceptable.  Monitor (3.5v@0 .5) is programmed for RV lead threshold output.  Histogram distribution appropriate for patient and level of activity. No mode switches or high ventricular rates noted. Patient educated about wound care.  Chronic leads in place, gen change only, no further restrictions with arm mobility.  Patient scheduled to return in 3 months to see implanting physician.

## 2023-01-01 NOTE — Patient Instructions (Signed)

## 2023-01-06 ENCOUNTER — Ambulatory Visit: Payer: Medicare PPO | Admitting: Physician Assistant

## 2023-01-23 DIAGNOSIS — R972 Elevated prostate specific antigen [PSA]: Secondary | ICD-10-CM | POA: Diagnosis not present

## 2023-01-23 DIAGNOSIS — N138 Other obstructive and reflux uropathy: Secondary | ICD-10-CM | POA: Diagnosis not present

## 2023-01-23 DIAGNOSIS — N529 Male erectile dysfunction, unspecified: Secondary | ICD-10-CM | POA: Diagnosis not present

## 2023-01-23 DIAGNOSIS — N401 Enlarged prostate with lower urinary tract symptoms: Secondary | ICD-10-CM | POA: Diagnosis not present

## 2023-02-03 ENCOUNTER — Other Ambulatory Visit: Payer: Medicare PPO

## 2023-02-04 ENCOUNTER — Other Ambulatory Visit: Payer: Medicare PPO

## 2023-02-05 DIAGNOSIS — H25013 Cortical age-related cataract, bilateral: Secondary | ICD-10-CM | POA: Diagnosis not present

## 2023-02-05 DIAGNOSIS — H524 Presbyopia: Secondary | ICD-10-CM | POA: Diagnosis not present

## 2023-02-05 DIAGNOSIS — H43813 Vitreous degeneration, bilateral: Secondary | ICD-10-CM | POA: Diagnosis not present

## 2023-02-05 DIAGNOSIS — H5203 Hypermetropia, bilateral: Secondary | ICD-10-CM | POA: Diagnosis not present

## 2023-02-05 DIAGNOSIS — H353132 Nonexudative age-related macular degeneration, bilateral, intermediate dry stage: Secondary | ICD-10-CM | POA: Diagnosis not present

## 2023-02-05 DIAGNOSIS — H2513 Age-related nuclear cataract, bilateral: Secondary | ICD-10-CM | POA: Diagnosis not present

## 2023-02-12 ENCOUNTER — Encounter: Payer: Self-pay | Admitting: Cardiology

## 2023-02-12 ENCOUNTER — Ambulatory Visit: Payer: Medicare PPO | Attending: Cardiology | Admitting: Cardiology

## 2023-02-12 ENCOUNTER — Ambulatory Visit: Payer: Medicare PPO | Attending: Cardiology

## 2023-02-12 VITALS — BP 128/72 | HR 66 | Ht 76.0 in | Wt 274.2 lb

## 2023-02-12 DIAGNOSIS — I441 Atrioventricular block, second degree: Secondary | ICD-10-CM

## 2023-02-12 DIAGNOSIS — I080 Rheumatic disorders of both mitral and aortic valves: Secondary | ICD-10-CM | POA: Diagnosis not present

## 2023-02-12 DIAGNOSIS — I517 Cardiomegaly: Secondary | ICD-10-CM | POA: Diagnosis not present

## 2023-02-12 DIAGNOSIS — I4719 Other supraventricular tachycardia: Secondary | ICD-10-CM

## 2023-02-12 DIAGNOSIS — I77810 Thoracic aortic ectasia: Secondary | ICD-10-CM | POA: Diagnosis not present

## 2023-02-12 DIAGNOSIS — I10 Essential (primary) hypertension: Secondary | ICD-10-CM

## 2023-02-12 DIAGNOSIS — I350 Nonrheumatic aortic (valve) stenosis: Secondary | ICD-10-CM

## 2023-02-12 DIAGNOSIS — I5030 Unspecified diastolic (congestive) heart failure: Secondary | ICD-10-CM | POA: Diagnosis not present

## 2023-02-12 LAB — ECHOCARDIOGRAM COMPLETE
AR max vel: 1.99 cm2
AV Area VTI: 2.25 cm2
AV Area mean vel: 2.04 cm2
AV Mean grad: 11 mmHg
AV Peak grad: 17.7 mmHg
Ao pk vel: 2.11 m/s
Area-P 1/2: 3.61 cm2
Height: 76 in
S' Lateral: 2.8 cm
Weight: 4387.2 oz

## 2023-02-12 NOTE — Progress Notes (Signed)
    Cardiology Office Note  Date: 02/12/2023   ID: Jonathan Lamb, DOB 10-Nov-1945, MRN [id number]  History of Present Illness: Jonathan Lamb is a 77 y.o. male last seen in November 2023.  He is here today with his wife for a follow-up visit.  He does not report any exertional chest pain, no change in stamina or increasing breathlessness, no palpitations or syncope.  He has a Medtronic dual-chamber pacemaker in place for management of symptomatic second-degree heart block, recent generator change by Dr. Ladona Ridgel in April.  I reviewed his medications which are unchanged from a cardiac perspective.  He continues to follow with Dr. Neita Carp.  I went over his interval lab work as well.  He did have an echocardiogram done earlier this morning for follow-up on aortic stenosis.  Preliminary review shows no substantial change in comparison to the prior study from 2021.  Full report pending at this time.  Physical Exam: VS:  BP 128/72   Pulse 66   Ht 6\' 4"  (1.93 m)   Wt 274 lb 3.2 oz (124.4 kg)   SpO2 99%   BMI 33.38 kg/m , BMI Body mass index is 33.38 kg/m.  Wt Readings from Last 3 Encounters:  02/12/23 274 lb 3.2 oz (124.4 kg)  12/16/22 270 lb (122.5 kg)  11/28/22 270 lb (122.5 kg)    General: Patient appears comfortable at rest. HEENT: Conjunctiva and lids normal. Neck: Supple, no elevated JVP or carotid bruits. Lungs: Clear to auscultation, nonlabored breathing at rest. Cardiac: Regular rate and rhythm, no S3, 3/6 systolic murmur. Extremities: No pitting edema.  ECG:  An ECG dated 11/28/2022 was personally reviewed today and demonstrated:  Dual-chamber pacing  Labwork: December 2023: Cholesterol 155, triglycerides 132, HDL 48, LDL 84 11/28/2022: BUN 16; Creatinine, Ser 1.00; Hemoglobin 14.9; Platelets 268; Potassium 4.2; Sodium 147   Other Studies Reviewed Today:  Abdominal ultrasound 08/08/2022: IMPRESSION: No evidence of abdominal aortic aneurysm. Maximum abdominal  aortic diameter 2.8 cm.  Assessment and Plan:  1.  Paroxysmal atrial tachycardia.  Quiescent at this time on current medical regimen including Toprol-XL.  2.  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 Ladona Ridgel in April.  3.  Degenerative calcific aortic stenosis, mild by echocardiogram in June 2021 with mean AV gradient 10 mmHg.  Follow-up echocardiogram performed earlier this morning with preliminary review showing no substantial change.  Full report to follow..  4.  Essential hypertension.  Continue Norvasc.  Disposition:  Follow up  6 months.  Signed, Jonelle Sidle, M.D., F.A.C.C.  HeartCare at [facility]

## 2023-02-12 NOTE — Patient Instructions (Addendum)

## 2023-02-18 DIAGNOSIS — L821 Other seborrheic keratosis: Secondary | ICD-10-CM | POA: Diagnosis not present

## 2023-02-18 DIAGNOSIS — D225 Melanocytic nevi of trunk: Secondary | ICD-10-CM | POA: Diagnosis not present

## 2023-02-18 DIAGNOSIS — L565 Disseminated superficial actinic porokeratosis (DSAP): Secondary | ICD-10-CM | POA: Diagnosis not present

## 2023-02-18 DIAGNOSIS — L84 Corns and callosities: Secondary | ICD-10-CM | POA: Diagnosis not present

## 2023-02-18 DIAGNOSIS — L570 Actinic keratosis: Secondary | ICD-10-CM | POA: Diagnosis not present

## 2023-02-18 DIAGNOSIS — L814 Other melanin hyperpigmentation: Secondary | ICD-10-CM | POA: Diagnosis not present

## 2023-02-25 ENCOUNTER — Encounter: Payer: Medicare PPO | Admitting: Internal Medicine

## 2023-02-27 DIAGNOSIS — H25812 Combined forms of age-related cataract, left eye: Secondary | ICD-10-CM | POA: Diagnosis not present

## 2023-02-27 DIAGNOSIS — H25012 Cortical age-related cataract, left eye: Secondary | ICD-10-CM | POA: Diagnosis not present

## 2023-02-27 DIAGNOSIS — H2512 Age-related nuclear cataract, left eye: Secondary | ICD-10-CM | POA: Diagnosis not present

## 2023-03-18 ENCOUNTER — Ambulatory Visit (INDEPENDENT_AMBULATORY_CARE_PROVIDER_SITE_OTHER): Payer: Medicare PPO

## 2023-03-18 DIAGNOSIS — I441 Atrioventricular block, second degree: Secondary | ICD-10-CM

## 2023-03-19 LAB — CUP PACEART REMOTE DEVICE CHECK
Battery Remaining Longevity: 110 mo
Battery Voltage: 3.16 V
Brady Statistic AP VP Percent: 54.83 %
Brady Statistic AP VS Percent: 0 %
Brady Statistic AS VP Percent: 45.13 %
Brady Statistic AS VS Percent: 0.04 %
Brady Statistic RA Percent Paced: 54.82 %
Brady Statistic RV Percent Paced: 99.96 %
Date Time Interrogation Session: 20240716021135
Implantable Lead Connection Status: 753985
Implantable Lead Connection Status: 753985
Implantable Lead Implant Date: 20180824
Implantable Lead Implant Date: 20180824
Implantable Lead Location: 753859
Implantable Lead Location: 753860
Implantable Lead Model: 5076
Implantable Lead Model: 5076
Implantable Pulse Generator Implant Date: 20240415
Lead Channel Impedance Value: 266 Ohm
Lead Channel Impedance Value: 323 Ohm
Lead Channel Impedance Value: 323 Ohm
Lead Channel Impedance Value: 532 Ohm
Lead Channel Pacing Threshold Amplitude: 0.5 V
Lead Channel Pacing Threshold Amplitude: 1.75 V
Lead Channel Pacing Threshold Pulse Width: 0.4 ms
Lead Channel Pacing Threshold Pulse Width: 0.4 ms
Lead Channel Sensing Intrinsic Amplitude: 3.5 mV
Lead Channel Sensing Intrinsic Amplitude: 3.5 mV
Lead Channel Sensing Intrinsic Amplitude: 9.875 mV
Lead Channel Sensing Intrinsic Amplitude: 9.875 mV
Lead Channel Setting Pacing Amplitude: 1.5 V
Lead Channel Setting Pacing Amplitude: 3.5 V
Lead Channel Setting Pacing Pulse Width: 0.4 ms
Lead Channel Setting Sensing Sensitivity: 2 mV
Zone Setting Status: 755011
Zone Setting Status: 755011

## 2023-03-20 ENCOUNTER — Encounter: Payer: Self-pay | Admitting: Internal Medicine

## 2023-03-20 ENCOUNTER — Ambulatory Visit: Payer: Medicare PPO | Attending: Internal Medicine | Admitting: Internal Medicine

## 2023-03-20 VITALS — BP 138/76 | HR 63 | Ht 76.0 in | Wt 271.0 lb

## 2023-03-20 DIAGNOSIS — I441 Atrioventricular block, second degree: Secondary | ICD-10-CM | POA: Diagnosis not present

## 2023-03-20 LAB — CUP PACEART INCLINIC DEVICE CHECK
Date Time Interrogation Session: 20240718150442
Implantable Lead Connection Status: 753985
Implantable Lead Connection Status: 753985
Implantable Lead Implant Date: 20180824
Implantable Lead Implant Date: 20180824
Implantable Lead Location: 753859
Implantable Lead Location: 753860
Implantable Lead Model: 5076
Implantable Lead Model: 5076
Implantable Pulse Generator Implant Date: 20240415

## 2023-03-20 NOTE — Progress Notes (Signed)
HPI [name] returns today for followup. He is a pleasant 77 yo man with CHB s/p PPM insertion. He had been followed by Dr. Fawn Kirk and returns today for ongoing arrhythmias followup. He also sees Dr. Diona Browner. He has HTN and a h/o AT. His pacing threshold has been up a little. He denies chest pain, sob, or worsening edema. He remains active working out regularly.  No Known Allergies   Current Outpatient Medications  Medication Sig Dispense Refill   amLODipine (NORVASC) 10 MG tablet TAKE 1 TABLET BY MOUTH EVERY DAY 180 tablet 3   aspirin 81 MG tablet Take 1 tablet (81 mg total) by mouth every evening. 30 tablet    Cholecalciferol (VITAMIN D3) 125 MCG (5000 UT) capsule Take 5,000 Units by mouth daily.     finasteride (PROSCAR) 5 MG tablet Take 5 mg by mouth every evening.      Homeopathic Products (COLD-EEZE) LOZG Use as directed 1 lozenge in the mouth or throat daily as needed (cold symptoms).     metoprolol succinate (TOPROL-XL) 25 MG 24 hr tablet TAKE 1 TABLET(25 MG) BY MOUTH DAILY 90 tablet 2   Multiple Vitamin (MULTIVITAMIN) tablet Take 1 tablet by mouth every evening.      multivitamin-lutein (OCUVITE-LUTEIN) CAPS capsule Take 1 capsule by mouth every evening.      naproxen sodium (ALEVE) 220 MG tablet Take 440 mg by mouth daily as needed (pain).      Omega-3 Fatty Acids (FISH OIL) 1200 MG CAPS Take 1,200 mg by mouth every evening.     Polyvinyl Alcohol-Povidone (REFRESH OP) Place 1 drop into both eyes daily as needed (dry eyes).     simvastatin (ZOCOR) 20 MG tablet Take 20 mg by mouth every evening.      tamsulosin (FLOMAX) 0.4 MG CAPS capsule Take 0.4 mg by mouth every evening.      No current facility-administered medications for this visit.     Past Medical History:  Diagnosis Date   Arthritis    Benign prostatic hyperplasia    Essential hypertension    Hemorrhoid    Hypercholesterolemia    RBBB    Second degree heart block    Medtronic pacemaker -Dr. Johney Lamb   SVT  (supraventricular tachycardia)    Atrial tachycardia    ROS:   All systems reviewed and negative except as noted in the HPI.   Past Surgical History:  Procedure Laterality Date   COLONOSCOPY N/A 03/24/2013   Procedure: COLONOSCOPY;  Surgeon: Jonathan Hippo, MD;  Location: [facility];  Service: Endoscopy;  Laterality: N/A;  830   COLONOSCOPY N/A 08/05/2018   Procedure: COLONOSCOPY;  Surgeon: Jonathan Hippo, MD;  Location: [facility];  Service: Endoscopy;  Laterality: N/A;  12   LAMINECTOMY     Lumbar   PACEMAKER IMPLANT N/A 04/25/2017   MDT Azure XT DR MRI implanted by Dr Jonathan Lamb for second degree AV block   POLYPECTOMY  08/05/2018   Procedure: POLYPECTOMY;  Surgeon: Jonathan Hippo, MD;  Location: [facility];  Service: Endoscopy;;  ascending polyp, splenic flexure polyp, sigmoid polyp   PPM GENERATOR CHANGEOUT N/A 12/16/2022   Procedure: PPM GENERATOR CHANGEOUT;  Surgeon: Jonathan Maw, MD;  Location: [facility];  Service: Cardiovascular;  Laterality: N/A;   PROSTATE BIOPSY     THYROID SURGERY  1999   L lobectomy, complicated by esophageal laceration requiring esophageal repair      Family History  Problem  Relation Age of Onset   Hypertension Father    Heart attack Father 49   Pelvic inflammatory disease Father    Hypertension Mother    Stroke Mother    Deep vein thrombosis Mother    Arthritis Brother    Pancreatic cancer Paternal Grandfather    Heart attack Maternal Grandfather 57     Social History   Socioeconomic History   Marital status: Married    Spouse name: Not on file   Number of children: 2   Years of education: Not on file   Highest education level: Not on file  Occupational History   Occupation: Retired    Comment: Runner, broadcasting/film/video  Tobacco Use   Smoking status: Never   Smokeless tobacco: Never  Vaping Use   Vaping status: Never Used  Substance and Sexual Activity   Alcohol use: Not Currently    Comment: 1 beer monthly   Drug use: No    Sexual activity: Yes    Birth control/protection: None  Other Topics Concern   Not on file  Social History Narrative   Lives at home with wife in [place].   Retired Engineer, maintenance.  Enjoys traveling and photography.   Social Determinants of Health   Financial Resource Strain: Not on file  Food Insecurity: Not on file  Transportation Needs: Not on file  Physical Activity: Not on file  Stress: Not on file  Social Connections: Not on file  Intimate Partner Violence: Not on file     BP 138/76   Pulse 63   Ht 6\' 4"  (1.93 m)   Wt 271 lb (122.9 kg)   SpO2 97%   BMI 32.99 kg/m   Physical Exam:  Well appearing NAD HEENT: Unremarkable Neck:  No JVD, no thyromegally Lymphatics:  No adenopathy Back:  No CVA tenderness Lungs:  Clear with no wheezes HEART:  Regular rate rhythm, no murmurs, no rubs, no clicks Abd:  soft, positive bowel sounds, no organomegally, no rebound, no guarding Ext:  2 plus pulses, no edema, no cyanosis, no clubbing Skin:  No rashes no nodules Neuro:  CN II through XII intact, motor grossly intact  EKG - NSR with AV pacing  DEVICE  Normal device function.  See PaceArt for details.   Assess/Plan: CHB - he is asymptomatic s/p PPM insertion PPM -his medtronic DDD PM is working normally. His V threshold is 1.75. He will undergo watchful waiting.   Jonathan Lamb Jonathan Huq,MD

## 2023-03-20 NOTE — Patient Instructions (Addendum)
 Instructions:  Your physician recommends that you continue on your current medications as directed. Please refer to the Current Medication list given to you today.  *If you need a refill on your cardiac medications before your next appointment, please call your pharmacy*   Lab Work: None ordered.  If you have labs (blood work) drawn today and your tests are completely normal, you will receive your results only by: MyChart Message (if you have MyChart) OR A paper copy in the mail If you have any lab test that is abnormal or we need to change your treatment, we will call you to review the results.   Testing/Procedures: None ordered.    Follow-Up: At [facility], you and your health needs are our priority.  As part of our continuing mission to provide you with exceptional heart care, we have created designated Provider Care Teams.  These Care Teams include your primary Cardiologist (physician) and Advanced Practice Providers (APPs -  Physician Assistants and Nurse Practitioners) who all work together to provide you with the care you need, when you need it.  We recommend signing up for the patient portal called "MyChart".  Sign up information is provided on this After Visit Summary.  MyChart is used to connect with patients for Virtual Visits (Telemedicine).  Patients are able to view lab/test results, encounter notes, upcoming appointments, etc.  Non-urgent messages can be sent to your provider as well.   To learn more about what you can do with MyChart, go to [email or web address].    Your next appointment:   12 months with Dr Ladona Ridgel

## 2023-04-01 ENCOUNTER — Encounter: Payer: Medicare PPO | Admitting: Internal Medicine

## 2023-04-03 DIAGNOSIS — H25011 Cortical age-related cataract, right eye: Secondary | ICD-10-CM | POA: Diagnosis not present

## 2023-04-03 DIAGNOSIS — H25811 Combined forms of age-related cataract, right eye: Secondary | ICD-10-CM | POA: Diagnosis not present

## 2023-04-03 DIAGNOSIS — H2511 Age-related nuclear cataract, right eye: Secondary | ICD-10-CM | POA: Diagnosis not present

## 2023-04-03 NOTE — Progress Notes (Signed)
Remote pacemaker transmission.   

## 2023-04-28 DIAGNOSIS — R972 Elevated prostate specific antigen [PSA]: Secondary | ICD-10-CM | POA: Diagnosis not present

## 2023-05-08 ENCOUNTER — Encounter: Payer: Self-pay | Admitting: Cardiology

## 2023-06-17 ENCOUNTER — Ambulatory Visit (INDEPENDENT_AMBULATORY_CARE_PROVIDER_SITE_OTHER): Payer: Medicare PPO

## 2023-06-17 DIAGNOSIS — I441 Atrioventricular block, second degree: Secondary | ICD-10-CM

## 2023-06-18 LAB — CUP PACEART REMOTE DEVICE CHECK
Battery Remaining Longevity: 112 mo
Battery Voltage: 3.1 V
Brady Statistic AP VP Percent: 49.89 %
Brady Statistic AP VS Percent: 0 %
Brady Statistic AS VP Percent: 50.03 %
Brady Statistic AS VS Percent: 0.08 %
Brady Statistic RA Percent Paced: 49.89 %
Brady Statistic RV Percent Paced: 99.92 %
Date Time Interrogation Session: 20241014195731
Implantable Lead Connection Status: 753985
Implantable Lead Connection Status: 753985
Implantable Lead Implant Date: 20180824
Implantable Lead Implant Date: 20180824
Implantable Lead Location: 753859
Implantable Lead Location: 753860
Implantable Lead Model: 5076
Implantable Lead Model: 5076
Implantable Pulse Generator Implant Date: 20240415
Lead Channel Impedance Value: 266 Ohm
Lead Channel Impedance Value: 323 Ohm
Lead Channel Impedance Value: 399 Ohm
Lead Channel Impedance Value: 608 Ohm
Lead Channel Pacing Threshold Amplitude: 0.5 V
Lead Channel Pacing Threshold Amplitude: 1.5 V
Lead Channel Pacing Threshold Pulse Width: 0.4 ms
Lead Channel Pacing Threshold Pulse Width: 0.4 ms
Lead Channel Sensing Intrinsic Amplitude: 10.5 mV
Lead Channel Sensing Intrinsic Amplitude: 10.5 mV
Lead Channel Sensing Intrinsic Amplitude: 3.875 mV
Lead Channel Sensing Intrinsic Amplitude: 3.875 mV
Lead Channel Setting Pacing Amplitude: 1.5 V
Lead Channel Setting Pacing Amplitude: 3.5 V
Lead Channel Setting Pacing Pulse Width: 0.4 ms
Lead Channel Setting Sensing Sensitivity: 2 mV
Zone Setting Status: 755011
Zone Setting Status: 755011

## 2023-07-07 NOTE — Progress Notes (Signed)
Remote pacemaker transmission.   

## 2023-07-28 ENCOUNTER — Encounter (INDEPENDENT_AMBULATORY_CARE_PROVIDER_SITE_OTHER): Payer: Self-pay | Admitting: *Deleted

## 2023-08-21 ENCOUNTER — Encounter: Payer: Self-pay | Admitting: Cardiology

## 2023-08-21 ENCOUNTER — Ambulatory Visit: Payer: Medicare PPO | Attending: Cardiology | Admitting: Cardiology

## 2023-08-21 VITALS — BP 136/78 | HR 65 | Ht 76.0 in | Wt 278.2 lb

## 2023-08-21 DIAGNOSIS — I10 Essential (primary) hypertension: Secondary | ICD-10-CM

## 2023-08-21 DIAGNOSIS — Z959 Presence of cardiac and vascular implant and graft, unspecified: Secondary | ICD-10-CM | POA: Diagnosis not present

## 2023-08-21 DIAGNOSIS — I350 Nonrheumatic aortic (valve) stenosis: Secondary | ICD-10-CM

## 2023-08-21 DIAGNOSIS — I4719 Other supraventricular tachycardia: Secondary | ICD-10-CM

## 2023-08-21 NOTE — Patient Instructions (Addendum)

## 2023-08-21 NOTE — Progress Notes (Signed)
Cardiology Office Note  Date: 08/21/2023   ID: Jonathan Lamb, DOB 01/07/1946, MRN [id number]  History of Present Illness: Jonathan Lamb is a 77 y.o. male last seen in June.  He is here today with his wife for follow-up visit.  States that he has been doing well.  They enjoyed their trip to [place].  He does not describe any new exertional symptoms, no palpitations or sudden syncope.  I reviewed his medications.  Current cardiac regimen includes aspirin, Norvasc, Toprol-XL, omega-3 supplements, and Zocor.  Medtronic dual-chamber pacemaker in place with follow-up by Dr. Ladona Ridgel.  Device check in October revealed normal function.  I reviewed his interval ECG.  Physical Exam: VS:  BP 136/78   Pulse 65   Ht 6\' 4"  (1.93 m)   Wt 278 lb 3.2 oz (126.2 kg)   SpO2 97%   BMI 33.86 kg/m , BMI Body mass index is 33.86 kg/m.  Wt Readings from Last 3 Encounters:  08/21/23 278 lb 3.2 oz (126.2 kg)  03/20/23 271 lb (122.9 kg)  02/12/23 274 lb 3.2 oz (124.4 kg)    General: Patient appears comfortable at rest. HEENT: Conjunctiva and lids normal. Neck: Supple, no elevated JVP or carotid bruits. Lungs: Clear to auscultation, nonlabored breathing at rest. Cardiac: Regular rate and rhythm, no S3, 3/6 systolic murmur, no pericardial rub.  ECG:  An ECG dated 03/20/2023 was personally reviewed today and demonstrated:  Ventricular paced rhythm.  Labwork: December 2023: Cholesterol 155, triglycerides 132, HDL 48, LDL 84 11/28/2022: BUN 16; Creatinine, Ser 1.00; Hemoglobin 14.9; Platelets 268; Potassium 4.2; Sodium 147   Other Studies Reviewed Today:  Echocardiogram 02/12/2023:  1. Left ventricular ejection fraction, by estimation, is 55 to 60%. The  left ventricle has normal function. The left ventricle has no regional  wall motion abnormalities. Left ventricular diastolic parameters are  consistent with Grade I diastolic  dysfunction (impaired relaxation). The average left ventricular global   longitudinal strain is -16.4 %. The global longitudinal strain is normal.   2. Right ventricular systolic function is normal. The right ventricular  size is normal. There is normal pulmonary artery systolic pressure. The  estimated right ventricular systolic pressure is 23.1 mmHg.   3. Left atrial size was mildly dilated.   4. The mitral valve is degenerative. Trivial mitral valve regurgitation.  Moderate mitral annular calcification.   5. The aortic valve is tricuspid. There is moderate calcification of the  aortic valve. Aortic valve regurgitation is not visualized. Mild aortic  valve stenosis. Aortic valve mean gradient measures 11.0 mmHg.  Dimentionless index 0.54.   6. Aortic dilatation noted. There is borderline dilatation of the aortic  root, measuring 40 mm. There is mild dilatation of the ascending aorta,  measuring 37 mm.   Assessment and Plan:  1.  Paroxysmal atrial tachycardia.  Asymptomatic without palpitations at this time.  Continue Toprol-XL.   2.  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  Keep follow-up with Dr. Ladona Ridgel.   3.  Degenerative calcific aortic stenosis, mild by echocardiogram in June at which point mean AV gradient was 11 mmHg and dimensionless index 0.54.  He is asymptomatic, will follow repeat imaging over time.   4.  Primary hypertension.  He continues to check blood pressure at home, generally lower measurements than in the office suggesting some component of whitecoat hypertension.  He reports compliance with his medications which include Norvasc as well.  Disposition:  Follow up  6 months.  Signed, Jonelle Sidle, M.D., F.A.C.C.  HeartCare at [facility]

## 2023-09-01 ENCOUNTER — Telehealth (INDEPENDENT_AMBULATORY_CARE_PROVIDER_SITE_OTHER): Payer: Self-pay | Admitting: Gastroenterology

## 2023-09-01 NOTE — Telephone Encounter (Signed)
Who is your primary care physician: Dr.Paul Sasser  Reasons for the colonoscopy: 5 year recall  Have you had a colonoscopy before?  Yes 2020?  Do you have family history of colon cancer? no  Previous colonoscopy with polyps removed? Yes 2020?  Do you have a history colorectal cancer?   no  Are you diabetic? If yes, Type 1 or Type 2?    no  Do you have a prosthetic or mechanical heart valve? no  Do you have a pacemaker/defibrillator?   Yes pacemaker  Have you had endocarditis/atrial fibrillation? Yes supraventricular tachycardia  Have you had joint replacement within the last 12 months?  no  Do you tend to be constipated or have to use laxatives? yes  Do you have any history of drugs or alchohol?  no  Do you use supplemental oxygen?  no  Have you had a stroke or heart attack within the last 6 months? no  Do you take weight loss medication?  no   Do you take any blood-thinning medications such as: (aspirin, warfarin, Plavix, Aggrenox)    If yes we need the name, milligram, dosage and who is prescribing doctor Aspirin 81mg  1 per day Dr.Sam Diona Browner Current Outpatient Medications on File Prior to Visit  Medication Sig Dispense Refill   amLODipine (NORVASC) 10 MG tablet TAKE 1 TABLET BY MOUTH EVERY DAY 180 tablet 3   aspirin 81 MG tablet Take 1 tablet (81 mg total) by mouth every evening. 30 tablet    Cholecalciferol (VITAMIN D3) 125 MCG (5000 UT) capsule Take 5,000 Units by mouth daily.     finasteride (PROSCAR) 5 MG tablet Take 5 mg by mouth every evening.      Homeopathic Products (COLD-EEZE) LOZG Use as directed 1 lozenge in the mouth or throat daily as needed (cold symptoms).     metoprolol succinate (TOPROL-XL) 25 MG 24 hr tablet TAKE 1 TABLET(25 MG) BY MOUTH DAILY 90 tablet 2   Multiple Vitamin (MULTIVITAMIN) tablet Take 1 tablet by mouth every evening.      multivitamin-lutein (OCUVITE-LUTEIN) CAPS capsule Take 1 capsule by mouth every evening.      naproxen sodium  (ALEVE) 220 MG tablet Take 440 mg by mouth daily as needed (pain).      Omega-3 Fatty Acids (FISH OIL) 1200 MG CAPS Take 1,200 mg by mouth every evening.     Polyvinyl Alcohol-Povidone (REFRESH OP) Place 1 drop into both eyes daily as needed (dry eyes).     simvastatin (ZOCOR) 20 MG tablet Take 20 mg by mouth every evening.      tamsulosin (FLOMAX) 0.4 MG CAPS capsule Take 0.4 mg by mouth every evening.      No current facility-administered medications on file prior to visit.    No Known Allergies   Pharmacy: [facility]  Primary Insurance Name: [facility]   Best number where you can be reached: [phone]                                                                        [phone]

## 2023-09-02 MED ORDER — PEG 3350-KCL-NA BICARB-NACL 420 G PO SOLR: 4000.0000 mL | Freq: Once | ORAL | 0 refills | Status: AC

## 2023-09-02 NOTE — Telephone Encounter (Signed)
Pt contacted and spoke with wife. Pt scheduled for 09/30/23 at 11am. Instructions will be mailed once pre op has been received. Prep sent to pharmacy. PA approved via [employer]

## 2023-09-02 NOTE — Addendum Note (Signed)
 Addended by: Marlowe Shores on: 09/02/2023 10:14 AM   Modules accepted: Orders

## 2023-09-02 NOTE — Telephone Encounter (Signed)
Questionnaire from recall, no referral needed  

## 2023-09-02 NOTE — Telephone Encounter (Signed)
Room 3 Thanks 

## 2023-09-10 DIAGNOSIS — E7801 Familial hypercholesterolemia: Secondary | ICD-10-CM | POA: Diagnosis not present

## 2023-09-10 DIAGNOSIS — E7849 Other hyperlipidemia: Secondary | ICD-10-CM | POA: Diagnosis not present

## 2023-09-10 DIAGNOSIS — R7303 Prediabetes: Secondary | ICD-10-CM | POA: Diagnosis not present

## 2023-09-10 DIAGNOSIS — I10 Essential (primary) hypertension: Secondary | ICD-10-CM | POA: Diagnosis not present

## 2023-09-11 DIAGNOSIS — N138 Other obstructive and reflux uropathy: Secondary | ICD-10-CM | POA: Diagnosis not present

## 2023-09-11 DIAGNOSIS — N529 Male erectile dysfunction, unspecified: Secondary | ICD-10-CM | POA: Diagnosis not present

## 2023-09-11 DIAGNOSIS — N401 Enlarged prostate with lower urinary tract symptoms: Secondary | ICD-10-CM | POA: Diagnosis not present

## 2023-09-11 DIAGNOSIS — R972 Elevated prostate specific antigen [PSA]: Secondary | ICD-10-CM | POA: Diagnosis not present

## 2023-09-16 ENCOUNTER — Ambulatory Visit (INDEPENDENT_AMBULATORY_CARE_PROVIDER_SITE_OTHER): Payer: Medicare PPO

## 2023-09-16 DIAGNOSIS — E782 Mixed hyperlipidemia: Secondary | ICD-10-CM | POA: Diagnosis not present

## 2023-09-16 DIAGNOSIS — N401 Enlarged prostate with lower urinary tract symptoms: Secondary | ICD-10-CM | POA: Diagnosis not present

## 2023-09-16 DIAGNOSIS — I441 Atrioventricular block, second degree: Secondary | ICD-10-CM | POA: Diagnosis not present

## 2023-09-16 DIAGNOSIS — Z6833 Body mass index (BMI) 33.0-33.9, adult: Secondary | ICD-10-CM | POA: Diagnosis not present

## 2023-09-16 DIAGNOSIS — R7303 Prediabetes: Secondary | ICD-10-CM | POA: Diagnosis not present

## 2023-09-16 DIAGNOSIS — Z0001 Encounter for general adult medical examination with abnormal findings: Secondary | ICD-10-CM | POA: Diagnosis not present

## 2023-09-16 DIAGNOSIS — R030 Elevated blood-pressure reading, without diagnosis of hypertension: Secondary | ICD-10-CM | POA: Diagnosis not present

## 2023-09-16 LAB — CUP PACEART REMOTE DEVICE CHECK
Battery Remaining Longevity: 111 mo
Battery Voltage: 3.05 V
Brady Statistic AP VP Percent: 49.5 %
Brady Statistic AP VS Percent: 0 %
Brady Statistic AS VP Percent: 50.45 %
Brady Statistic AS VS Percent: 0.05 %
Brady Statistic RA Percent Paced: 49.49 %
Brady Statistic RV Percent Paced: 99.94 %
Date Time Interrogation Session: 20250114091919
Implantable Lead Connection Status: 753985
Implantable Lead Connection Status: 753985
Implantable Lead Implant Date: 20180824
Implantable Lead Implant Date: 20180824
Implantable Lead Location: 753859
Implantable Lead Location: 753860
Implantable Lead Model: 5076
Implantable Lead Model: 5076
Implantable Pulse Generator Implant Date: 20240415
Lead Channel Impedance Value: 285 Ohm
Lead Channel Impedance Value: 342 Ohm
Lead Channel Impedance Value: 456 Ohm
Lead Channel Impedance Value: 646 Ohm
Lead Channel Pacing Threshold Amplitude: 0.5 V
Lead Channel Pacing Threshold Amplitude: 1.75 V
Lead Channel Pacing Threshold Pulse Width: 0.4 ms
Lead Channel Pacing Threshold Pulse Width: 0.4 ms
Lead Channel Sensing Intrinsic Amplitude: 1.5 mV
Lead Channel Sensing Intrinsic Amplitude: 1.5 mV
Lead Channel Sensing Intrinsic Amplitude: 11 mV
Lead Channel Sensing Intrinsic Amplitude: 11 mV
Lead Channel Setting Pacing Amplitude: 1.5 V
Lead Channel Setting Pacing Amplitude: 3.5 V
Lead Channel Setting Pacing Pulse Width: 0.4 ms
Lead Channel Setting Sensing Sensitivity: 2 mV
Zone Setting Status: 755011
Zone Setting Status: 755011

## 2023-09-23 ENCOUNTER — Encounter (HOSPITAL_COMMUNITY): Payer: Self-pay

## 2023-09-23 ENCOUNTER — Other Ambulatory Visit: Payer: Self-pay

## 2023-09-23 ENCOUNTER — Encounter (HOSPITAL_COMMUNITY)
Admission: RE | Admit: 2023-09-23 | Discharge: 2023-09-23 | Disposition: A | Discharge status: Home / Self Care | Payer: Medicare PPO | Source: Ambulatory Visit | Attending: Gastroenterology | Admitting: Gastroenterology

## 2023-09-23 HISTORY — DX: Presence of cardiac pacemaker: Z95.0

## 2023-09-23 NOTE — Pre-Procedure Instructions (Signed)
Attempted pre-op phone call. Left VM for him to call us back. 

## 2023-09-26 ENCOUNTER — Encounter: Payer: Self-pay | Admitting: Internal Medicine

## 2023-09-26 NOTE — Progress Notes (Signed)
PERIOPERATIVE PRESCRIPTION FOR IMPLANTED CARDIAC DEVICE PROGRAMMING  Patient Information: Name:  Jonathan Lamb  DOB:  11-22-1945  MRN:  [id number]    Planned Procedure:  Colonoscopy w/ Propofol  Surgeon:  Dr. Katrinka Blazing  Date of Procedure: 09/30/2023  Position during surgery:   Left lateral  Device Information:  Clinic EP Physician:  Lewayne Bunting, MD   Device Type:  Pacemaker Manufacturer and Phone #:  Medtronic: [phone] Pacemaker Dependent?:  Yes.   Date of Last Device Check:  03/20/23 in office - normal , 09/16/23 Remote - normal  Normal Device Function?:  Yes.    Electrophysiologist's Recommendations:  Have magnet available. Provide continuous ECG monitoring when magnet is used or reprogramming is to be performed.  Procedure may interfere with device function.  Magnet should be placed over device during procedure.  Per Device Clinic Standing Orders, Kizzie Ide, RN  1:36 PM 09/26/2023

## 2023-09-30 ENCOUNTER — Ambulatory Visit (HOSPITAL_COMMUNITY): Payer: Medicare PPO | Admitting: Anesthesiology

## 2023-09-30 ENCOUNTER — Ambulatory Visit (HOSPITAL_COMMUNITY)
Admission: RE | Admit: 2023-09-30 | Discharge: 2023-09-30 | Disposition: A | Discharge status: Home / Self Care | Payer: Medicare PPO | Attending: Gastroenterology | Admitting: Gastroenterology

## 2023-09-30 ENCOUNTER — Encounter (HOSPITAL_COMMUNITY): Payer: Self-pay | Admitting: Gastroenterology

## 2023-09-30 ENCOUNTER — Encounter (HOSPITAL_COMMUNITY)
Admission: RE | Disposition: A | Discharge status: Home / Self Care | Payer: Self-pay | Source: Home / Self Care | Attending: Gastroenterology

## 2023-09-30 ENCOUNTER — Telehealth (INDEPENDENT_AMBULATORY_CARE_PROVIDER_SITE_OTHER): Payer: Self-pay | Admitting: *Deleted

## 2023-09-30 DIAGNOSIS — Z860100 Personal history of colon polyps, unspecified: Secondary | ICD-10-CM

## 2023-09-30 DIAGNOSIS — Z1211 Encounter for screening for malignant neoplasm of colon: Secondary | ICD-10-CM

## 2023-09-30 DIAGNOSIS — K648 Other hemorrhoids: Secondary | ICD-10-CM

## 2023-09-30 DIAGNOSIS — D124 Benign neoplasm of descending colon: Secondary | ICD-10-CM

## 2023-09-30 DIAGNOSIS — Z860101 Personal history of adenomatous and serrated colon polyps: Secondary | ICD-10-CM | POA: Diagnosis not present

## 2023-09-30 DIAGNOSIS — I10 Essential (primary) hypertension: Secondary | ICD-10-CM | POA: Diagnosis not present

## 2023-09-30 DIAGNOSIS — D120 Benign neoplasm of cecum: Secondary | ICD-10-CM

## 2023-09-30 DIAGNOSIS — D125 Benign neoplasm of sigmoid colon: Secondary | ICD-10-CM

## 2023-09-30 DIAGNOSIS — K635 Polyp of colon: Secondary | ICD-10-CM | POA: Diagnosis not present

## 2023-09-30 DIAGNOSIS — I499 Cardiac arrhythmia, unspecified: Secondary | ICD-10-CM | POA: Diagnosis not present

## 2023-09-30 DIAGNOSIS — Z950 Presence of cardiac pacemaker: Secondary | ICD-10-CM | POA: Insufficient documentation

## 2023-09-30 DIAGNOSIS — D123 Benign neoplasm of transverse colon: Secondary | ICD-10-CM | POA: Diagnosis not present

## 2023-09-30 DIAGNOSIS — Q439 Congenital malformation of intestine, unspecified: Secondary | ICD-10-CM | POA: Diagnosis not present

## 2023-09-30 DIAGNOSIS — D122 Benign neoplasm of ascending colon: Secondary | ICD-10-CM

## 2023-09-30 HISTORY — PX: COLONOSCOPY WITH PROPOFOL: SHX5780

## 2023-09-30 HISTORY — PX: POLYPECTOMY: SHX5525

## 2023-09-30 LAB — HM COLONOSCOPY

## 2023-09-30 SURGERY — COLONOSCOPY WITH PROPOFOL
Anesthesia: General

## 2023-09-30 MED ORDER — PROPOFOL 500 MG/50ML IV EMUL
INTRAVENOUS | Status: DC | PRN
  Administered 2023-09-30: 125 ug/kg/min via INTRAVENOUS

## 2023-09-30 MED ORDER — PHENYLEPHRINE HCL (PRESSORS) 10 MG/ML IV SOLN
INTRAVENOUS | Status: DC | PRN
  Administered 2023-09-30: 80 ug via INTRAVENOUS

## 2023-09-30 MED ORDER — PROPOFOL 10 MG/ML IV BOLUS
INTRAVENOUS | Status: DC | PRN
  Administered 2023-09-30: 100 mg via INTRAVENOUS

## 2023-09-30 MED ORDER — LIDOCAINE HCL (PF) 2 % IJ SOLN
INTRAMUSCULAR | Status: DC | PRN
  Administered 2023-09-30: 60 mg via INTRADERMAL

## 2023-09-30 MED ORDER — LACTATED RINGERS IV SOLN: INTRAVENOUS | Status: DC | PRN

## 2023-09-30 NOTE — Telephone Encounter (Signed)
Thanks

## 2023-09-30 NOTE — Transfer of Care (Signed)
Immediate Anesthesia Transfer of Care Note  Patient: Jonathan Lamb  Procedure(s) Performed: COLONOSCOPY WITH PROPOFOL POLYPECTOMY  Patient Location: PACU  Anesthesia Type:General  Level of Consciousness: awake, alert , and patient cooperative  Airway & Oxygen Therapy: Patient Spontanous Breathing  Post-op Assessment: Report given to RN, Post -op Vital signs reviewed and stable, and Patient moving all extremities X 4  Post vital signs: Reviewed and stable  Last Vitals:  Vitals Value Taken Time  BP 93/47 09/30/23 1112  Temp 36.2 C 09/30/23 1112  Pulse 59 09/30/23 1112  Resp 14 09/30/23 1112  SpO2 99 % 09/30/23 1112    Last Pain:  Vitals:   09/30/23 1112  TempSrc: Axillary  PainSc: 0-No pain         Complications: No notable events documented.

## 2023-09-30 NOTE — Discharge Instructions (Signed)
You are being discharged to home.  Resume your previous diet.  We are waiting for your pathology results.  Your physician has recommended a repeat colonoscopy in one year for surveillance.

## 2023-09-30 NOTE — Telephone Encounter (Signed)
-----   Message from Katrinka Blazing Mayorga sent at 09/30/2023 11:28 AM EST ----- Casandra Doffing, can you please refer the patient to genetic counseling? Dx colonic polyposis  Thanks,   Katrinka Blazing, MD Gastroenterology and Hepatology [facility]

## 2023-09-30 NOTE — Telephone Encounter (Signed)
Referral sent, they will contact patient with apt

## 2023-09-30 NOTE — Op Note (Signed)
[facility] Patient Name: Jonathan Lamb Procedure Date: 09/30/2023 10:07 AM MRN: [id number] Date of Birth: Mar 14, 1946 Attending MD: Katrinka Blazing , , [id number] CSN: [id number] Age: 78 Admit Type: Outpatient Procedure:                Colonoscopy Indications:              Surveillance: Personal history of adenomatous                            polyps on last colonoscopy > 5 years ago Providers:                Katrinka Blazing, Nena Polio, RN, Elinor Parkinson Referring MD:              Medicines:                Monitored Anesthesia Care Complications:            No immediate complications., Estimated Blood Loss:     Estimated blood loss: none. Procedure:                Pre-Anesthesia Assessment:                           - Prior to the procedure, a History and Physical                            was performed, and patient medications, allergies                            and sensitivities were reviewed. The patient's                            tolerance of previous anesthesia was reviewed.                           - The risks and benefits of the procedure and the                            sedation options and risks were discussed with the                            patient. All questions were answered and informed                            consent was obtained.                           - ASA Grade Assessment: III - A patient with severe                            systemic disease.                           After obtaining informed consent, the colonoscope                            was passed under  direct vision. Throughout the                            procedure, the patient's blood pressure, pulse, and                            oxygen saturations were monitored continuously. The                            [id number]-[id number] ([id number]) was introduced through the                            anus and advanced to the the cecum, identified by                            appendiceal  orifice and ileocecal valve. The                            colonoscopy was performed without difficulty. The                            patient tolerated the procedure well. The quality                            of the bowel preparation was good.                           22 Modifier was used because patient had extensive                            amount of polyps (>10 polyps) and tortuos colon,                            requiring a procedure for 52 minutes. Scope In: 10:25:36 AM Scope Out: 11:07:00 AM Scope Withdrawal Time: 0 hours 33 minutes 44 seconds  Total Procedure Duration: 0 hours 41 minutes 24 seconds  Findings:      The perianal and digital rectal examinations were normal.      Seven sessile polyps were found in the transverse colon and cecum. The       polyps were 3 to 8 mm in size. These polyps were removed with a cold       snare. Resection and retrieval were complete.      Three sessile polyps were found in the transverse colon and ascending       colon. The polyps were 1 to 2 mm in size. These polyps were removed with       a cold biopsy forceps. Resection and retrieval were complete.      Four sessile polyps were found in the sigmoid colon and descending       colon. The polyps were 3 to 12 mm in size. These polyps were removed       with a cold snare. Resection and retrieval were complete.      Non-bleeding internal hemorrhoids were found during retroflexion. The       hemorrhoids were medium-sized. Impression:               -  Seven 3 to 8 mm polyps in the transverse colon                            and in the cecum, removed with a cold snare.                            Resected and retrieved.                           - Three 1 to 2 mm polyps in the transverse colon                            and in the ascending colon, removed with a cold                            biopsy forceps. Resected and retrieved.                           - Four 3 to 12 mm polyps in the  sigmoid colon and                            in the descending colon, removed with a cold snare.                            Resected and retrieved.                           - Non-bleeding internal hemorrhoids. Moderate Sedation:      Per Anesthesia Care Recommendation:           - Discharge patient to home (ambulatory).                           - Resume previous diet.                           - Await pathology results.                           - Repeat colonoscopy in 1 year for surveillance. Procedure Code(s):        --- Professional ---                           [id number], 22, Colonoscopy, flexible; with removal of                            tumor(s), polyp(s), or other lesion(s) by snare                            technique                           45380, 59, Colonoscopy, flexible; with biopsy,  single or multiple Diagnosis Code(s):        --- Professional ---                           Z86.010, Personal history of colonic polyps                           D12.0, Benign neoplasm of cecum                           D12.3, Benign neoplasm of transverse colon (hepatic                            flexure or splenic flexure)                           D12.2, Benign neoplasm of ascending colon                           D12.5, Benign neoplasm of sigmoid colon                           D12.4, Benign neoplasm of descending colon                           K64.8, Other hemorrhoids CPT copyright 2022 American [employer] Association. All rights reserved. The codes documented in this report are preliminary and upon coder review may  be revised to meet current compliance requirements. Katrinka Blazing, MD Katrinka Blazing,  09/30/2023 11:16:29 AM This report has been signed electronically. Number of Addenda: 0

## 2023-09-30 NOTE — Anesthesia Preprocedure Evaluation (Addendum)
Anesthesia Evaluation  Patient identified by MRN, date of birth, ID band Patient awake    Reviewed: Allergy & Precautions, H&P , NPO status , Patient's Chart, lab work & pertinent test results, reviewed documented beta blocker date and time   Airway Mallampati: II  TM Distance: >3 FB Neck ROM: full    Dental no notable dental hx.    Pulmonary neg pulmonary ROS   Pulmonary exam normal breath sounds clear to auscultation       Cardiovascular Exercise Tolerance: Good hypertension, + DOE  + dysrhythmias + pacemaker  Rhythm:regular Rate:Normal     Neuro/Psych negative neurological ROS  negative psych ROS   GI/Hepatic negative GI ROS, Neg liver ROS,,,  Endo/Other  negative endocrine ROS    Renal/GU negative Renal ROS  negative genitourinary   Musculoskeletal   Abdominal   Peds  Hematology negative hematology ROS (+)   Anesthesia Other Findings   Reproductive/Obstetrics negative OB ROS                             Anesthesia Physical Anesthesia Plan  ASA: 3  Anesthesia Plan: General   Post-op Pain Management:    Induction:   PONV Risk Score and Plan: Propofol infusion  Airway Management Planned:   Additional Equipment:   Intra-op Plan:   Post-operative Plan:   Informed Consent: I have reviewed the patients History and Physical, chart, labs and discussed the procedure including the risks, benefits and alternatives for the proposed anesthesia with the patient or authorized representative who has indicated his/her understanding and acceptance.     Dental Advisory Given  Plan Discussed with: CRNA  Anesthesia Plan Comments:        Anesthesia Quick Evaluation

## 2023-09-30 NOTE — H&P (Signed)
Jonathan Lamb is an 78 y.o. male.   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  Last colonoscopy performed in 2019, had 3 tubular adenomas removed.  The patient denies having any complaints such as melena, hematochezia, abdominal pain or distention, change in her bowel movement consistency or frequency, no changes in weight recently.  No family history of colorectal cancer.   Past Medical History:  Diagnosis Date   Arthritis    Benign prostatic hyperplasia    Essential hypertension    Hemorrhoid    Hypercholesterolemia    Presence of permanent cardiac pacemaker    RBBB    Second degree heart block    Medtronic pacemaker -Dr. Johney Frame   [facility]ra[facility]ricular [facility]cardia[facility])    Atrial tachycardia    Past Surgical History:  Procedure Laterality Date   COLONOSCOPY N/A 03/24/2013   Procedure: COLONOSCOPY;  Surgeon: Malissa Hippo, MD;  [facility];  Service: Endoscopy;  Laterality: N/A;  830   COLONOSCOPY N/A 08/05/2018   Procedure: COLONOSCOPY;  Surgeon: Malissa Hippo, MD;  [facility]: [facility];  Service: Endoscopy;  Laterality: N/A;  12   LAMINECTOMY     Lumbar   PACEMAKER IMPLANT N/A 04/25/2017   MDT Azure XT DR MRI implanted by Dr Johney Frame for second degree AV block   POLYPECTOMY  08/05/2018   Procedure: POLYPECTOMY;  Surgeon: Malissa Hippo, MD;  [facility];  Service: Endoscopy;;  ascending polyp, splenic flexure polyp, sigmoid polyp   PPM GENERATOR CHANGEOUT N/A 12/16/2022   Procedure: PPM GENERATOR CHANGEOUT;  Surgeon: Marinus Maw, MD;  Location: [facility] INVAS[facility] CV LAB;  Service: Cardiovascular;  Laterality: N/A;   PROSTATE BIOPSY     THYROID SURGERY  1999   L lobectomy, complicated by esophageal laceration requiring esophageal repair     Family History  Problem  Relation Age of Onset   Hypertension Father    Heart attack Father 34   Pelvic inflammatory disease Father    Hypertension Mother    Stroke Mother    Deep vein thrombosis Mother    Arthritis Brother    Pancreatic cancer Paternal Grandfather    Heart attack Maternal Grandfather 9   Social History:  reports that he has never smoked. He has never used smokeless tobacco. He reports that he does not currently use alcohol. He reports that he does not use drugs.  Allergies: No Known Allergies  Medications Prior to Admission  Medication Sig Dispense Refill   amLODipine (NORVASC) 10 MG tablet TAKE 1 TABLET BY MOUTH EVERY DAY 180 tablet 3   aspirin 81 MG tablet Take 1 tablet (81 mg total) by mouth every evening. 30 tablet    Cholecalciferol (VITAMIN D3) 125 MCG (5000 UT) capsule Take 5,000 Units by mouth daily.     finasteride (PROSCAR) 5 MG tablet Take 5 mg by mouth every evening.      Homeopathic Products (COLD-EEZE) LOZG Use as directed 1 lozenge in the mouth or throat daily as needed (cold symptoms).     metoprolol succinate (TOPROL-XL) 25 MG 24 hr tablet TAKE 1 TABLET(25 MG) BY MOUTH DAILY 90 tablet 2   Multiple Vitamin (MULTIVITAMIN) tablet Take 1 tablet by mouth every evening.      multivitamin-lutein (OCUVITE-LUTEIN) CAPS capsule Take 1 capsule by mouth every evening.      naproxen sodium (ALEVE)  220 MG tablet Take 440 mg by mouth daily as needed (pain).      Omega-3 Fatty Acids (FISH OIL) 1200 MG CAPS Take 1,200 mg by mouth every evening.     Polyvinyl Alcohol-Povidone (REFRESH OP) Place 1 drop into both eyes daily as needed (dry eyes).     simvastatin (ZOCOR) 20 MG tablet Take 20 mg by mouth every evening.      tamsulosin (FLOMAX) 0.4 MG CAPS capsule Take 0.4 mg by mouth every evening.       No results found for this or any previous visit (from the past 48 hours). No results found.  Review of Systems  All other systems reviewed and are negative.   Blood pressure (!) 149/83,  pulse 62, temperature 98 F (36.7 C), temperature source Oral, resp. rate 12, height 6' 3.25" (1.911 m), weight 121.5 kg, SpO2 100%. Physical Exam  GENERAL: The patient is AO x3, in no acute distress. HEENT: Head is normocephalic and atraumatic. EOMI are intact. Mouth is well hydrated and without lesions. NECK: Supple. No masses LUNGS: Clear to auscultation. No presence of rhonchi/wheezing/rales. Adequate chest expansion HEART: RRR, normal s1 and s2. ABDOMEN: Soft, nontender, no guarding, no peritoneal signs, and nondistended. BS +. No masses. EXTREMITIES: Without any cyanosis, clubbing, rash, lesions or edema. NEUROLOGIC: AOx3, no focal motor deficit. 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  Will proceed with colonoscopy.  Dolores Frame, MD 09/30/2023, 10:17 AM

## 2023-10-01 ENCOUNTER — Encounter (INDEPENDENT_AMBULATORY_CARE_PROVIDER_SITE_OTHER): Payer: Self-pay | Admitting: *Deleted

## 2023-10-01 ENCOUNTER — Encounter (HOSPITAL_COMMUNITY): Payer: Self-pay | Admitting: Gastroenterology

## 2023-10-01 ENCOUNTER — Telehealth: Payer: Self-pay | Admitting: Genetic Counselor

## 2023-10-01 LAB — SURGICAL PATHOLOGY

## 2023-10-01 NOTE — Anesthesia Postprocedure Evaluation (Signed)
Anesthesia Post Note  Patient: Jonathan Lamb  Procedure(s) Performed: COLONOSCOPY WITH PROPOFOL POLYPECTOMY  Patient location during evaluation: Phase II Anesthesia Type: General Level of consciousness: awake Pain management: pain level controlled Vital Signs Assessment: post-procedure vital signs reviewed and stable Respiratory status: spontaneous breathing and respiratory function stable Cardiovascular status: blood pressure returned to baseline and stable Postop Assessment: no headache and no apparent nausea or vomiting Anesthetic complications: no Comments: Late entry   No notable events documented.   Last Vitals:  Vitals:   09/30/23 1112 09/30/23 1125  BP: (!) 93/47 116/73  Pulse: (!) 59   Resp: 14   Temp: (!) 36.2 C   SpO2: 99%     Last Pain:  Vitals:   09/30/23 1112  TempSrc: Axillary  PainSc: 0-No pain                 Windell Norfolk

## 2023-10-01 NOTE — Telephone Encounter (Signed)
Contacted the patient in regards to 1/29 scheduling message for genetic counseling. Patient stated he would like to discuss the matter with his wife and would call back if he decided it was something he wants.

## 2023-10-02 ENCOUNTER — Encounter (INDEPENDENT_AMBULATORY_CARE_PROVIDER_SITE_OTHER): Payer: Self-pay | Admitting: *Deleted

## 2023-10-30 NOTE — Progress Notes (Signed)
 Remote pacemaker transmission.

## 2023-12-16 ENCOUNTER — Ambulatory Visit: Payer: Medicare PPO

## 2023-12-16 DIAGNOSIS — I441 Atrioventricular block, second degree: Secondary | ICD-10-CM | POA: Diagnosis not present

## 2023-12-18 ENCOUNTER — Encounter: Payer: Self-pay | Admitting: Internal Medicine

## 2023-12-18 LAB — CUP PACEART REMOTE DEVICE CHECK
Battery Remaining Longevity: 110 mo
Battery Voltage: 3.03 V
Brady Statistic AP VP Percent: 50.28 %
Brady Statistic AP VS Percent: 0 %
Brady Statistic AS VP Percent: 49.66 %
Brady Statistic AS VS Percent: 0.07 %
Brady Statistic RA Percent Paced: 50.28 %
Brady Statistic RV Percent Paced: 99.93 %
Date Time Interrogation Session: 20250415094345
Implantable Lead Connection Status: 753985
Implantable Lead Connection Status: 753985
Implantable Lead Implant Date: 20180824
Implantable Lead Implant Date: 20180824
Implantable Lead Location: 753859
Implantable Lead Location: 753860
Implantable Lead Model: 5076
Implantable Lead Model: 5076
Implantable Pulse Generator Implant Date: 20240415
Lead Channel Impedance Value: 266 Ohm
Lead Channel Impedance Value: 323 Ohm
Lead Channel Impedance Value: 475 Ohm
Lead Channel Impedance Value: 703 Ohm
Lead Channel Pacing Threshold Amplitude: 0.5 V
Lead Channel Pacing Threshold Amplitude: 1.75 V
Lead Channel Pacing Threshold Pulse Width: 0.4 ms
Lead Channel Pacing Threshold Pulse Width: 0.4 ms
Lead Channel Sensing Intrinsic Amplitude: 3.875 mV
Lead Channel Sensing Intrinsic Amplitude: 3.875 mV
Lead Channel Sensing Intrinsic Amplitude: 7.75 mV
Lead Channel Sensing Intrinsic Amplitude: 7.75 mV
Lead Channel Setting Pacing Amplitude: 1.5 V
Lead Channel Setting Pacing Amplitude: 3.5 V
Lead Channel Setting Pacing Pulse Width: 0.4 ms
Lead Channel Setting Sensing Sensitivity: 2 mV
Zone Setting Status: 755011
Zone Setting Status: 755011

## 2023-12-24 DIAGNOSIS — Z6833 Body mass index (BMI) 33.0-33.9, adult: Secondary | ICD-10-CM | POA: Diagnosis not present

## 2023-12-24 DIAGNOSIS — M545 Low back pain, unspecified: Secondary | ICD-10-CM | POA: Diagnosis not present

## 2023-12-31 DIAGNOSIS — M6281 Muscle weakness (generalized): Secondary | ICD-10-CM | POA: Diagnosis not present

## 2023-12-31 DIAGNOSIS — M545 Low back pain, unspecified: Secondary | ICD-10-CM | POA: Diagnosis not present

## 2024-01-05 DIAGNOSIS — M6281 Muscle weakness (generalized): Secondary | ICD-10-CM | POA: Diagnosis not present

## 2024-01-07 DIAGNOSIS — M545 Low back pain, unspecified: Secondary | ICD-10-CM | POA: Diagnosis not present

## 2024-01-07 DIAGNOSIS — M6281 Muscle weakness (generalized): Secondary | ICD-10-CM | POA: Diagnosis not present

## 2024-01-13 DIAGNOSIS — M6281 Muscle weakness (generalized): Secondary | ICD-10-CM | POA: Diagnosis not present

## 2024-01-13 DIAGNOSIS — M545 Low back pain, unspecified: Secondary | ICD-10-CM | POA: Diagnosis not present

## 2024-01-15 DIAGNOSIS — M545 Low back pain, unspecified: Secondary | ICD-10-CM | POA: Diagnosis not present

## 2024-01-15 DIAGNOSIS — M6281 Muscle weakness (generalized): Secondary | ICD-10-CM | POA: Diagnosis not present

## 2024-01-19 DIAGNOSIS — M545 Low back pain, unspecified: Secondary | ICD-10-CM | POA: Diagnosis not present

## 2024-01-19 DIAGNOSIS — M6281 Muscle weakness (generalized): Secondary | ICD-10-CM | POA: Diagnosis not present

## 2024-01-30 NOTE — Progress Notes (Signed)
 Remote pacemaker transmission.

## 2024-01-30 NOTE — Addendum Note (Signed)
 Addended by: Akshath Mccarey A on: 01/30/2024 02:26 PM   Modules accepted: Orders

## 2024-03-03 DIAGNOSIS — L821 Other seborrheic keratosis: Secondary | ICD-10-CM | POA: Diagnosis not present

## 2024-03-03 DIAGNOSIS — L570 Actinic keratosis: Secondary | ICD-10-CM | POA: Diagnosis not present

## 2024-03-03 DIAGNOSIS — L565 Disseminated superficial actinic porokeratosis (DSAP): Secondary | ICD-10-CM | POA: Diagnosis not present

## 2024-03-03 DIAGNOSIS — D1801 Hemangioma of skin and subcutaneous tissue: Secondary | ICD-10-CM | POA: Diagnosis not present

## 2024-03-03 DIAGNOSIS — L814 Other melanin hyperpigmentation: Secondary | ICD-10-CM | POA: Diagnosis not present

## 2024-03-16 ENCOUNTER — Ambulatory Visit: Payer: Medicare PPO

## 2024-03-16 DIAGNOSIS — I441 Atrioventricular block, second degree: Secondary | ICD-10-CM

## 2024-03-17 LAB — CUP PACEART REMOTE DEVICE CHECK
Battery Remaining Longevity: 110 mo
Battery Voltage: 3.02 V
Brady Statistic AP VP Percent: 47.55 %
Brady Statistic AP VS Percent: 0 %
Brady Statistic AS VP Percent: 52.38 %
Brady Statistic AS VS Percent: 0.07 %
Brady Statistic RA Percent Paced: 47.56 %
Brady Statistic RV Percent Paced: 99.93 %
Date Time Interrogation Session: 20250715074341
Implantable Lead Connection Status: 753985
Implantable Lead Connection Status: 753985
Implantable Lead Implant Date: 20180824
Implantable Lead Implant Date: 20180824
Implantable Lead Location: 753859
Implantable Lead Location: 753860
Implantable Lead Model: 5076
Implantable Lead Model: 5076
Implantable Pulse Generator Implant Date: 20240415
Lead Channel Impedance Value: 285 Ohm
Lead Channel Impedance Value: 342 Ohm
Lead Channel Impedance Value: 551 Ohm
Lead Channel Impedance Value: 760 Ohm
Lead Channel Pacing Threshold Amplitude: 0.5 V
Lead Channel Pacing Threshold Amplitude: 1.875 V
Lead Channel Pacing Threshold Pulse Width: 0.4 ms
Lead Channel Pacing Threshold Pulse Width: 0.4 ms
Lead Channel Sensing Intrinsic Amplitude: 4.5 mV
Lead Channel Sensing Intrinsic Amplitude: 4.5 mV
Lead Channel Sensing Intrinsic Amplitude: 9.75 mV
Lead Channel Sensing Intrinsic Amplitude: 9.75 mV
Lead Channel Setting Pacing Amplitude: 1.5 V
Lead Channel Setting Pacing Amplitude: 3.5 V
Lead Channel Setting Pacing Pulse Width: 0.4 ms
Lead Channel Setting Sensing Sensitivity: 2 mV
Zone Setting Status: 755011
Zone Setting Status: 755011

## 2024-03-22 ENCOUNTER — Ambulatory Visit: Payer: Self-pay | Admitting: Internal Medicine

## 2024-03-30 ENCOUNTER — Ambulatory Visit: Attending: Internal Medicine | Admitting: Internal Medicine

## 2024-03-30 ENCOUNTER — Encounter: Payer: Self-pay | Admitting: Internal Medicine

## 2024-03-30 VITALS — BP 124/72 | HR 73 | Ht 76.0 in | Wt 269.2 lb

## 2024-03-30 DIAGNOSIS — I4719 Other supraventricular tachycardia: Secondary | ICD-10-CM

## 2024-03-30 LAB — CUP PACEART INCLINIC DEVICE CHECK
Brady Statistic RA Percent Paced: 49.2 %
Brady Statistic RV Percent Paced: 99.9 %
Date Time Interrogation Session: 20250729140841
Implantable Lead Connection Status: 753985
Implantable Lead Connection Status: 753985
Implantable Lead Implant Date: 20180824
Implantable Lead Implant Date: 20180824
Implantable Lead Location: 753859
Implantable Lead Location: 753860
Implantable Lead Model: 5076
Implantable Lead Model: 5076
Implantable Pulse Generator Implant Date: 20240415

## 2024-03-30 NOTE — Progress Notes (Signed)
 HPI Mr. Koy returns today for followup. He is a pleasant 78 yo man with CHB s/p PPM insertion. He had been followed by Dr. MILUS and returns today for ongoing arrhythmias followup. He also sees Dr. Debera. He has HTN and a h/o AT. His pacing threshold has been up a little. He denies chest pain, sob, or worsening edema. He remains active working out regularly. He has assorted muscular aches but no angina. He thinks he has a little more dyspnea.  No Known Allergies   Current Outpatient Medications  Medication Sig Dispense Refill   amLODipine  (NORVASC ) 10 MG tablet TAKE 1 TABLET BY MOUTH EVERY DAY 180 tablet 3   aspirin  81 MG tablet Take 1 tablet (81 mg total) by mouth every evening. 30 tablet    Cholecalciferol (VITAMIN D3) 125 MCG (5000 UT) capsule Take 5,000 Units by mouth daily.     finasteride  (PROSCAR ) 5 MG tablet Take 5 mg by mouth every evening.      Homeopathic Products (COLD-EEZE) LOZG Use as directed 1 lozenge in the mouth or throat daily as needed (cold symptoms).     metoprolol  succinate (TOPROL -XL) 25 MG 24 hr tablet TAKE 1 TABLET(25 MG) BY MOUTH DAILY 90 tablet 2   Multiple Vitamin (MULTIVITAMIN) tablet Take 1 tablet by mouth every evening.      multivitamin-lutein (OCUVITE-LUTEIN) CAPS capsule Take 1 capsule by mouth every evening.      naproxen sodium (ALEVE) 220 MG tablet Take 440 mg by mouth daily as needed (pain).      Omega-3 Fatty Acids (FISH OIL) 1200 MG CAPS Take 1,200 mg by mouth every evening.     Polyvinyl Alcohol -Povidone (REFRESH OP) Place 1 drop into both eyes daily as needed (dry eyes).     simvastatin  (ZOCOR ) 20 MG tablet Take 20 mg by mouth every evening.      tamsulosin  (FLOMAX ) 0.4 MG CAPS capsule Take 0.4 mg by mouth every evening.      No current facility-administered medications for this visit.     Past Medical History:  Diagnosis Date   Arthritis    Benign prostatic hyperplasia    Essential hypertension    Hemorrhoid     Hypercholesterolemia    Presence of permanent cardiac pacemaker    RBBB    Second degree heart block    Medtronic pacemaker -Dr. Kelsie   SVT (supraventricular tachycardia) ([facility])    Atrial tachycardia    ROS:   All systems reviewed and negative except as noted in the HPI.   Past Surgical History:  Procedure Laterality Date   COLONOSCOPY N/A 03/24/2013   Procedure: COLONOSCOPY;  Surgeon: Claudis RAYMOND Rivet, MD;  [facility];  Service: Endoscopy;  Laterality: N/A;  830   COLONOSCOPY N/A 08/05/2018   Procedure: COLONOSCOPY;  Surgeon: Rivet Claudis RAYMOND, MD;  [facility];  Service: Endoscopy;  Laterality: N/A;  12   COLONOSCOPY WITH PROPOFOL  N/A 09/30/2023   Procedure: COLONOSCOPY WITH PROPOFOL ;  Surgeon: Eartha Angelia Sieving, MD;  [facility];  Service: Gastroenterology;  Laterality: N/A;  11:00AM;ASA 3   LAMINECTOMY     Lumbar   PACEMAKER IMPLANT N/A 04/25/2017   MDT Azure XT DR MRI implanted by Dr Kelsie for second degree AV block   POLYPECTOMY  08/05/2018   Procedure: POLYPECTOMY;  Surgeon: Rivet Claudis RAYMOND, MD;  [facility];  Service: Endoscopy;;  ascending polyp, splenic flexure polyp, sigmoid polyp   POLYPECTOMY  09/30/2023  Procedure: POLYPECTOMY;  Surgeon: Eartha Angelia Sieving, MD;  [facility];  Service: Gastroenterology;;   [facility] GENERATOR CHANGEOUT N/A 12/16/2022   Procedure: PPM GENERATOR CHANGEOUT;  Surgeon: Waddell Danelle ORN, MD;  Location: [facility] INVASIVE CV LAB;  Service: Cardiovascular;  Laterality: N/A;   PROSTATE BIOPSY     THYROID  SURGERY  1999   L lobectomy, complicated by esophageal laceration requiring esophageal repair      Family History  Problem Relation Age of Onset   Hypertension Father    Heart attack Father 50   Pelvic inflammatory disease Father    Hypertension Mother    Stroke Mother    Deep vein thrombosis Mother    Arthritis Brother    Pancreatic cancer Paternal Grandfather    Heart  attack Maternal Grandfather 54     Social History   Socioeconomic History   Marital status: Married    Spouse name: Not on file   Number of children: 2   Years of education: Not on file   Highest education level: Not on file  Occupational History   Occupation: Retired    Comment: Runner, broadcasting/film/video  Tobacco Use   Smoking status: Never   Smokeless tobacco: Never  Vaping Use   Vaping status: Never Used  Substance and Sexual Activity   Alcohol  use: Not Currently    Comment: 1 beer monthly   Drug use: No   Sexual activity: Yes    Birth control/protection: None  Other Topics Concern   Not on file  Social History Narrative   Lives at home with wife in [place].   Retired Engineer, maintenance.  Enjoys traveling and photography.   Social Drivers of Corporate investment banker Strain: Not on file  Food Insecurity: Not on file  Transportation Needs: Not on file  Physical Activity: Not on file  Stress: Not on file  Social Connections: Not on file  Intimate Partner Violence: Not on file     BP 124/72   Pulse 73   Ht 6' 4 (1.93 m)   Wt 269 lb 3.2 oz (122.1 kg)   SpO2 95%   BMI 32.77 kg/m   Physical Exam:  Well appearing NAD HEENT: Unremarkable Neck:  No JVD, no thyromegally Lymphatics:  No adenopathy Back:  No CVA tenderness Lungs:  Clear HEART:  Regular rate rhythm, no murmurs, no rubs, no clicks Abd:  soft, positive bowel sounds, no organomegally, no rebound, no guarding Ext:  2 plus pulses, no edema, no cyanosis, no clubbing Skin:  No rashes no nodules Neuro:  CN II through XII intact, motor grossly intact  EKG - P synch ventricular pacing  DEVICE  Normal device function.  See PaceArt for details.   A/P CHB - he is asymptomatic s/p PPM insertion PPM -his medtronic DDD PM is working normally. His V threshold is 1.25 at 0.8 ms. He will undergo watchful waiting.    Danelle Jeny Nield,MD

## 2024-03-30 NOTE — Patient Instructions (Signed)

## 2024-04-08 DIAGNOSIS — N401 Enlarged prostate with lower urinary tract symptoms: Secondary | ICD-10-CM | POA: Diagnosis not present

## 2024-04-08 DIAGNOSIS — N529 Male erectile dysfunction, unspecified: Secondary | ICD-10-CM | POA: Diagnosis not present

## 2024-04-08 DIAGNOSIS — R972 Elevated prostate specific antigen [PSA]: Secondary | ICD-10-CM | POA: Diagnosis not present

## 2024-04-08 DIAGNOSIS — N138 Other obstructive and reflux uropathy: Secondary | ICD-10-CM | POA: Diagnosis not present

## 2024-04-28 ENCOUNTER — Ambulatory Visit: Attending: Cardiology | Admitting: Cardiology

## 2024-04-28 ENCOUNTER — Encounter: Payer: Self-pay | Admitting: Cardiology

## 2024-04-28 VITALS — BP 128/80 | HR 80 | Ht 76.0 in | Wt 266.0 lb

## 2024-04-28 DIAGNOSIS — I350 Nonrheumatic aortic (valve) stenosis: Secondary | ICD-10-CM

## 2024-04-28 DIAGNOSIS — I4719 Other supraventricular tachycardia: Secondary | ICD-10-CM | POA: Diagnosis not present

## 2024-04-28 DIAGNOSIS — I10 Essential (primary) hypertension: Secondary | ICD-10-CM

## 2024-04-28 DIAGNOSIS — I441 Atrioventricular block, second degree: Secondary | ICD-10-CM | POA: Diagnosis not present

## 2024-04-28 NOTE — Patient Instructions (Addendum)

## 2024-04-28 NOTE — Progress Notes (Signed)
    Cardiology Office Note  Date: 04/28/2024   ID: Jonathan Lamb, DOB 06/02/1946, MRN [id number]  History of Present Illness: Jonathan Lamb is a 78 y.o. male last seen in December 2024.  He is here today with his wife for a follow-up visit.  He does not report any progressive sense of palpitations.  Does sometimes feel lightheaded when he stands up, but has had no frank syncope.  He tracks his blood pressure at home.  Reports feeling of an occasional mild, dull left lower anterior chest discomfort sometimes at rest, but this does not occur with activity and somewhat atypical for angina.  We discussed his medications.  He reports compliance with current regimen, no obvious intolerances.  He continues to follow at [facility].  Medtronic pacemaker in place with follow-up by Dr. Waddell.  I reviewed his interval office note from July.  Recent device interrogation noted as well.  Physical Exam: VS:  BP 128/80 (BP Location: Left Arm)   Pulse 80   Ht 6' 4 (1.93 m)   Wt 266 lb (120.7 kg)   SpO2 98%   BMI 32.38 kg/m , BMI Body mass index is 32.38 kg/m.  Wt Readings from Last 3 Encounters:  04/28/24 266 lb (120.7 kg)  03/30/24 269 lb 3.2 oz (122.1 kg)  09/30/23 267 lb 13.7 oz (121.5 kg)    General: Patient appears comfortable at rest. HEENT: Conjunctiva and lids normal. Neck: Supple, no elevated JVP or carotid bruits. Lungs: Clear to auscultation, nonlabored breathing at rest. Cardiac: Regular rate and rhythm, no S3, 2-3/6 systolic murmur. Extremities: No pitting edema.  ECG:  An ECG dated 03/30/2024 was personally reviewed today and demonstrated:  Ventricular paced rhythm.  Labwork:  March 2024: Hemoglobin 14.9, platelets 268, BUN 16, creatinine 1, GFR 78, potassium 4.2  Other Studies Reviewed Today:  No interval cardiac testing for review today.  Assessment and Plan:  1.  Paroxysmal atrial tachycardia.  Symptomatically stable with no recurring palpitations on Toprol -XL 25 mg  daily.  Continue observation.   2.  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  Keep follow-up with Dr. Waddell.  Continue with serial device interrogations.   3.  Degenerative calcific aortic stenosis, mild by echocardiogram in June 2024 at which point mean AV gradient was 11 mmHg and dimensionless index 0.54.  He is asymptomatic, no significant change in cardiac murmur.   4.  Primary hypertension.  Also whitecoat hypertension.  Blood pressure rechecked by me at 128/80 in the left arm.  He is following blood pressure measurements at home.  Disposition:  Follow up 6 months.  Signed, Jonathan Lamb, M.D., F.A.C.C. [facility]

## 2024-06-09 NOTE — Progress Notes (Signed)
 Remote PPM Transmission

## 2024-06-15 ENCOUNTER — Ambulatory Visit

## 2024-06-15 DIAGNOSIS — H26491 Other secondary cataract, right eye: Secondary | ICD-10-CM | POA: Diagnosis not present

## 2024-06-15 DIAGNOSIS — I441 Atrioventricular block, second degree: Secondary | ICD-10-CM

## 2024-06-15 DIAGNOSIS — H43813 Vitreous degeneration, bilateral: Secondary | ICD-10-CM | POA: Diagnosis not present

## 2024-06-15 DIAGNOSIS — H353132 Nonexudative age-related macular degeneration, bilateral, intermediate dry stage: Secondary | ICD-10-CM | POA: Diagnosis not present

## 2024-06-15 DIAGNOSIS — H04123 Dry eye syndrome of bilateral lacrimal glands: Secondary | ICD-10-CM | POA: Diagnosis not present

## 2024-06-15 DIAGNOSIS — H35033 Hypertensive retinopathy, bilateral: Secondary | ICD-10-CM | POA: Diagnosis not present

## 2024-06-16 ENCOUNTER — Encounter (INDEPENDENT_AMBULATORY_CARE_PROVIDER_SITE_OTHER): Payer: Self-pay | Admitting: Gastroenterology

## 2024-06-16 LAB — CUP PACEART REMOTE DEVICE CHECK
Battery Remaining Longevity: 112 mo
Battery Voltage: 3.01 V
Brady Statistic AP VP Percent: 54.91 %
Brady Statistic AP VS Percent: 0 %
Brady Statistic AS VP Percent: 45.03 %
Brady Statistic AS VS Percent: 0.06 %
Brady Statistic RA Percent Paced: 54.91 %
Brady Statistic RV Percent Paced: 99.94 %
Date Time Interrogation Session: 20251014133302
Implantable Lead Connection Status: 753985
Implantable Lead Connection Status: 753985
Implantable Lead Implant Date: 20180824
Implantable Lead Implant Date: 20180824
Implantable Lead Location: 753859
Implantable Lead Location: 753860
Implantable Lead Model: 5076
Implantable Lead Model: 5076
Implantable Pulse Generator Implant Date: 20240415
Lead Channel Impedance Value: 285 Ohm
Lead Channel Impedance Value: 361 Ohm
Lead Channel Impedance Value: 589 Ohm
Lead Channel Impedance Value: 817 Ohm
Lead Channel Pacing Threshold Amplitude: 0.5 V
Lead Channel Pacing Threshold Amplitude: 2 V
Lead Channel Pacing Threshold Pulse Width: 0.4 ms
Lead Channel Pacing Threshold Pulse Width: 0.4 ms
Lead Channel Sensing Intrinsic Amplitude: 4.25 mV
Lead Channel Sensing Intrinsic Amplitude: 4.25 mV
Lead Channel Sensing Intrinsic Amplitude: 7.5 mV
Lead Channel Sensing Intrinsic Amplitude: 7.5 mV
Lead Channel Setting Pacing Amplitude: 1.5 V
Lead Channel Setting Pacing Amplitude: 2.5 V
Lead Channel Setting Pacing Pulse Width: 0.8 ms
Lead Channel Setting Sensing Sensitivity: 2 mV
Zone Setting Status: 755011
Zone Setting Status: 755011

## 2024-06-17 ENCOUNTER — Ambulatory Visit: Payer: Self-pay | Admitting: Internal Medicine

## 2024-06-18 NOTE — Progress Notes (Signed)
 Remote PPM Transmission

## 2024-08-19 ENCOUNTER — Encounter (INDEPENDENT_AMBULATORY_CARE_PROVIDER_SITE_OTHER): Payer: Self-pay | Admitting: *Deleted

## 2024-09-14 ENCOUNTER — Ambulatory Visit

## 2024-09-14 DIAGNOSIS — I441 Atrioventricular block, second degree: Secondary | ICD-10-CM | POA: Diagnosis not present

## 2024-09-15 LAB — CUP PACEART REMOTE DEVICE CHECK
Battery Remaining Longevity: 111 mo
Battery Voltage: 3.01 V
Brady Statistic AP VP Percent: 46.68 %
Brady Statistic AP VS Percent: 0 %
Brady Statistic AS VP Percent: 53.25 %
Brady Statistic AS VS Percent: 0.07 %
Brady Statistic RA Percent Paced: 46.68 %
Brady Statistic RV Percent Paced: 99.93 %
Date Time Interrogation Session: 20260113092023
Implantable Lead Connection Status: 753985
Implantable Lead Connection Status: 753985
Implantable Lead Implant Date: 20180824
Implantable Lead Implant Date: 20180824
Implantable Lead Location: 753859
Implantable Lead Location: 753860
Implantable Lead Model: 5076
Implantable Lead Model: 5076
Implantable Pulse Generator Implant Date: 20240415
Lead Channel Impedance Value: 304 Ohm
Lead Channel Impedance Value: 342 Ohm
Lead Channel Impedance Value: 646 Ohm
Lead Channel Impedance Value: 893 Ohm
Lead Channel Pacing Threshold Amplitude: 0.5 V
Lead Channel Pacing Threshold Amplitude: 2.125 V
Lead Channel Pacing Threshold Pulse Width: 0.4 ms
Lead Channel Pacing Threshold Pulse Width: 0.4 ms
Lead Channel Sensing Intrinsic Amplitude: 2.625 mV
Lead Channel Sensing Intrinsic Amplitude: 2.625 mV
Lead Channel Sensing Intrinsic Amplitude: 9.125 mV
Lead Channel Sensing Intrinsic Amplitude: 9.125 mV
Lead Channel Setting Pacing Amplitude: 1.5 V
Lead Channel Setting Pacing Amplitude: 2.5 V
Lead Channel Setting Pacing Pulse Width: 0.8 ms
Lead Channel Setting Sensing Sensitivity: 2 mV
Zone Setting Status: 755011
Zone Setting Status: 755011

## 2024-09-17 ENCOUNTER — Ambulatory Visit: Payer: Self-pay | Admitting: Cardiovascular Disease

## 2024-09-17 NOTE — Progress Notes (Signed)
 Remote PPM Transmission

## 2024-09-22 ENCOUNTER — Telehealth (INDEPENDENT_AMBULATORY_CARE_PROVIDER_SITE_OTHER): Payer: Self-pay

## 2024-09-22 NOTE — Telephone Encounter (Signed)
 Who is your primary care physician: Dr. Elspeth Messier - [facility]  Reasons for the colonoscopy: screening, history of colon polyps  Have you had a colonoscopy before?  Yes, 08/05/2018 and 09/30/2023  Do you have family history of colon cancer? no  Previous colonoscopy with polyps removed? Yes, 09/30/2023  Do you have a history colorectal cancer?   no  Are you diabetic? If yes, Type 1 or Type 2?    no  Do you have a prosthetic or mechanical heart valve? no  Do you have a pacemaker/defibrillator?   pacemaker  Have you had endocarditis/atrial fibrillation? no  Have you had joint replacement within the last 12 months?  no  Do you tend to be constipated or have to use laxatives? yes  Do you have any history of drugs or alcohol ?  no  Do you use supplemental oxygen?  no  Have you had a stroke or heart attack within the last 6 months? no  Do you take weight loss medication?  no  Do you take any blood-thinning medications such as: (aspirin , warfarin, Plavix, Aggrenox)  yes  If yes we need the name, milligram, dosage and who is prescribing doctor aspirin  81 mg, Dr. Jayson Sierras [phone]  Current Outpatient Medications  Medication Sig Dispense Refill   amLODipine  (NORVASC ) 10 MG tablet TAKE 1 TABLET BY MOUTH EVERY DAY 180 tablet 3   aspirin  81 MG tablet Take 1 tablet (81 mg total) by mouth every evening. 30 tablet    Cholecalciferol (VITAMIN D3) 125 MCG (5000 UT) capsule Take 5,000 Units by mouth daily.     finasteride  (PROSCAR ) 5 MG tablet Take 5 mg by mouth every evening.      Homeopathic Products (COLD-EEZE) LOZG Use as directed 1 lozenge in the mouth or throat daily as needed (cold symptoms).     metoprolol  succinate (TOPROL -XL) 25 MG 24 hr tablet TAKE 1 TABLET(25 MG) BY MOUTH DAILY 90 tablet 2   Multiple Vitamin (MULTIVITAMIN) tablet Take 1 tablet by mouth every evening.      multivitamin-lutein (OCUVITE-LUTEIN) CAPS capsule Take 1 capsule by mouth every  evening.      naproxen sodium (ALEVE) 220 MG tablet Take 440 mg by mouth daily as needed (pain).      Omega-3 Fatty Acids (FISH OIL) 1200 MG CAPS Take 1,200 mg by mouth every evening.     Polyvinyl Alcohol -Povidone (REFRESH OP) Place 1 drop into both eyes daily as needed (dry eyes).     simvastatin  (ZOCOR ) 20 MG tablet Take 20 mg by mouth every evening.      tamsulosin  (FLOMAX ) 0.4 MG CAPS capsule Take 0.4 mg by mouth every evening.      No current facility-administered medications for this visit.    Allergies[1]  Pharmacy: [facility]  Primary Insurance Name: [employer]lene many  Best number where you can be reached: [phone]     [1] No Known Allergies

## 2024-09-22 NOTE — Telephone Encounter (Signed)
Ok to schedule.  Room :Any   Thanks,  Vista Lawman, MD Gastroenterology and Hepatology [facility]

## 2024-09-23 NOTE — Telephone Encounter (Signed)
 Patient's wife left vm wanting to schedule patient's colonoscopy. I do not have a DPR on file for our office to speak with her. I ATC patient but got no answer, lvm for call back.

## 2024-09-24 MED ORDER — PEG 3350-KCL-NA BICARB-NACL 420 G PO SOLR: 4000.0000 mL | Freq: Once | ORAL | 0 refills | Status: AC

## 2024-09-24 NOTE — Telephone Encounter (Signed)
 Prior authorization is not required for this code. -[id number] -[employer] website

## 2024-09-24 NOTE — Telephone Encounter (Signed)
 ATC patient, no answer. Lvm for call back.

## 2024-09-24 NOTE — Addendum Note (Signed)
 Addended by: DALLIE LIONEL RAMAN on: 09/24/2024 10:48 AM   Modules accepted: Orders

## 2024-09-24 NOTE — Telephone Encounter (Signed)
 Spoke with patient in the office, scheduled colonoscopy for 10/25/2024 at 9am. Rx sent to pharmacy. Instructions given to patient.

## 2024-10-21 ENCOUNTER — Encounter (HOSPITAL_COMMUNITY)

## 2024-10-25 ENCOUNTER — Encounter (HOSPITAL_COMMUNITY): Payer: Self-pay

## 2024-10-25 ENCOUNTER — Ambulatory Visit (HOSPITAL_COMMUNITY): Admit: 2024-10-25 | Admitting: Gastroenterology

## 2024-11-09 ENCOUNTER — Ambulatory Visit: Admitting: Cardiology

## 2024-12-14 ENCOUNTER — Encounter

## 2025-03-15 ENCOUNTER — Encounter

## 2025-06-14 ENCOUNTER — Encounter
